# Patient Record
Sex: Female | Born: 1970 | Race: Black or African American | Hispanic: No | Marital: Single | Smoking: Current some day smoker
Health system: Southern US, Community
[De-identification: ages and names within clinical notes are randomized; demographics above are authoritative.]

## PROBLEM LIST (undated history)

## (undated) ENCOUNTER — Inpatient Hospital Stay (HOSPITAL_COMMUNITY): Payer: Self-pay

## (undated) DIAGNOSIS — Z3490 Encounter for supervision of normal pregnancy, unspecified, unspecified trimester: Secondary | ICD-10-CM

## (undated) DIAGNOSIS — F99 Mental disorder, not otherwise specified: Secondary | ICD-10-CM

## (undated) DIAGNOSIS — I10 Essential (primary) hypertension: Secondary | ICD-10-CM

---

## 2007-10-09 ENCOUNTER — Ambulatory Visit: Payer: Self-pay | Admitting: Internal Medicine

## 2007-10-09 ENCOUNTER — Encounter (INDEPENDENT_AMBULATORY_CARE_PROVIDER_SITE_OTHER): Payer: Self-pay | Admitting: Nurse Practitioner

## 2007-10-09 LAB — CONVERTED CEMR LAB
ALT: 10 units/L (ref 0–35)
AST: 14 units/L (ref 0–37)
Alkaline Phosphatase: 75 units/L (ref 39–117)
Basophils Absolute: 0.1 10*3/uL (ref 0.0–0.1)
Basophils Relative: 1 % (ref 0–1)
Calcium: 9.8 mg/dL (ref 8.4–10.5)
Chloride: 107 meq/L (ref 96–112)
Creatinine, Ser: 1.04 mg/dL (ref 0.40–1.20)
Eosinophils Absolute: 0.2 10*3/uL (ref 0.0–0.7)
Hemoglobin: 13.4 g/dL (ref 12.0–15.0)
MCHC: 32.7 g/dL (ref 30.0–36.0)
MCV: 89.1 fL (ref 78.0–100.0)
Monocytes Absolute: 0.5 10*3/uL (ref 0.1–1.0)
Neutro Abs: 3.4 10*3/uL (ref 1.7–7.7)
Neutrophils Relative %: 53 % (ref 43–77)
RDW: 14.8 % (ref 11.5–15.5)

## 2007-10-16 ENCOUNTER — Ambulatory Visit (HOSPITAL_COMMUNITY): Admission: RE | Admit: 2007-10-16 | Discharge: 2007-10-16 | Payer: Self-pay | Admitting: Family Medicine

## 2008-09-09 ENCOUNTER — Emergency Department (HOSPITAL_COMMUNITY): Admission: EM | Admit: 2008-09-09 | Discharge: 2008-09-09 | Payer: Self-pay | Admitting: Emergency Medicine

## 2008-09-24 ENCOUNTER — Ambulatory Visit: Payer: Self-pay | Admitting: Internal Medicine

## 2008-10-01 ENCOUNTER — Inpatient Hospital Stay (HOSPITAL_COMMUNITY): Admission: AD | Admit: 2008-10-01 | Discharge: 2008-10-01 | Payer: Self-pay | Admitting: Obstetrics & Gynecology

## 2010-08-04 LAB — GLUCOSE, CAPILLARY: Glucose-Capillary: 93 mg/dL (ref 70–99)

## 2011-10-08 ENCOUNTER — Ambulatory Visit (HOSPITAL_COMMUNITY)
Admission: RE | Admit: 2011-10-08 | Discharge: 2011-10-08 | Disposition: A | Discharge status: Home / Self Care | Payer: Medicaid Other | Attending: Psychiatry | Admitting: Psychiatry

## 2011-10-08 ENCOUNTER — Emergency Department (HOSPITAL_COMMUNITY)
Admission: EM | Admit: 2011-10-08 | Discharge: 2011-10-09 | Disposition: A | Discharge status: Other Acute Inpatient Hospital | Payer: Self-pay | Attending: Emergency Medicine | Admitting: Emergency Medicine

## 2011-10-08 ENCOUNTER — Encounter (HOSPITAL_COMMUNITY): Payer: Self-pay | Admitting: Emergency Medicine

## 2011-10-08 DIAGNOSIS — F29 Unspecified psychosis not due to a substance or known physiological condition: Secondary | ICD-10-CM | POA: Insufficient documentation

## 2011-10-08 DIAGNOSIS — Z9119 Patient's noncompliance with other medical treatment and regimen: Secondary | ICD-10-CM | POA: Insufficient documentation

## 2011-10-08 DIAGNOSIS — Z91199 Patient's noncompliance with other medical treatment and regimen due to unspecified reason: Secondary | ICD-10-CM | POA: Insufficient documentation

## 2011-10-08 DIAGNOSIS — Z598 Other problems related to housing and economic circumstances: Secondary | ICD-10-CM | POA: Diagnosis not present

## 2011-10-08 DIAGNOSIS — Z5987 Material hardship due to limited financial resources, not elsewhere classified: Secondary | ICD-10-CM | POA: Insufficient documentation

## 2011-10-08 NOTE — ED Provider Notes (Addendum)
History     CSN: [id number]  Arrival date & time 10/08/11  2016   First MD Initiated Contact with Patient 10/08/11 2255      Chief Complaint  Patient presents with  . Delusional    (Consider location/radiation/quality/duration/timing/severity/associated sxs/prior treatment) HPI History provided by patient. Sent here from behavioral health for medical clearance. Patient is a limited historian. She does have psychiatric history but cannot coming what her diagnosis is. About 2 years ago she was prescribed Haldol, Seroquel, Cymbalta, and Respirdal.  She states she has not taken those medications in that timeframe. She was previously admitted to the psychiatric facility in [facility] [place].  She had been staying in a women's shelter and states she left tonight.  She went to behavioral health and was referred here for medical clearance. Patient's main complaint is that she has some sort of device interbody. It was previously in her uterus and now feels like its in her side. She denies any pain. She denies any vaginal bleeding or discharge. She states history of C-section x1 about 9 years ago and believes something they have been placed at that time. She denies hearing voices or seeing things that are not there. She denies any suicidal or homicidal ideation. He denies any fevers or chills. No trauma. No abdominal pain, back pain or pelvic pain.  History reviewed. No pertinent past medical history.  History reviewed. No pertinent past surgical history.  No family history on file.  History  Substance Use Topics  . Smoking status: Never Smoker   . Smokeless tobacco: Not on file  . Alcohol Use: No    OB History    Grav Para Term Preterm Abortions TAB SAB Ect Mult Living                  Review of Systems  Constitutional: Negative for fever and chills.  HENT: Negative for neck pain and neck stiffness.   Eyes: Negative for pain.  Respiratory: Negative for shortness of breath.    Cardiovascular: Negative for chest pain.  Gastrointestinal: Negative for abdominal pain.  Genitourinary: Negative for dysuria.  Musculoskeletal: Negative for back pain.  Skin: Negative for rash.  Neurological: Negative for headaches.  Psychiatric/Behavioral: Positive for hallucinations. Negative for suicidal ideas, self-injury and agitation.  All other systems reviewed and are negative.    Allergies  Review of patient's allergies indicates no known allergies.  Home Medications  No current outpatient prescriptions on file.  BP 162/108  Pulse 62  Temp 99.2 F (37.3 C) (Oral)  Resp 20  SpO2 100%  Physical Exam  Constitutional: She is oriented to person, place, and time. She appears well-developed and well-nourished.  HENT:  Head: Normocephalic and atraumatic.  Eyes: Conjunctivae and EOM are normal. Pupils are equal, round, and reactive to light.  Neck: Trachea normal. Neck supple. No thyromegaly present.  Cardiovascular: Normal rate, regular rhythm, S1 normal, S2 normal and normal pulses.     No systolic murmur is present   No diastolic murmur is present  Pulses:      Radial pulses are 2+ on the right side, and 2+ on the left side.  Pulmonary/Chest: Effort normal and breath sounds normal. She has no wheezes. She has no rhonchi. She has no rales. She exhibits no tenderness.  Abdominal: Soft. Normal appearance and bowel sounds are normal. There is no tenderness. There is no rebound, no guarding, no CVA tenderness and negative Murphy's sign.       No ecchymosis, rash  or lesions. No palpable tenderness or mass.  Musculoskeletal:       BLE:s Calves nontender, no cords or erythema, negative Homans sign  Neurological: She is alert and oriented to person, place, and time. She has normal strength. No cranial nerve deficit or sensory deficit. GCS eye subscore is 4. GCS verbal subscore is 5. GCS motor subscore is 6.  Skin: Skin is warm and dry. No rash noted. She is not diaphoretic.    Psychiatric: Her speech is normal.       Cooperative and appropriate    ED Course  Procedures (including critical care time) Results for orders placed during the hospital encounter of 10/08/11  URINE RAPID DRUG SCREEN (HOSP PERFORMED)      Component Value Range   Opiates NONE DETECTED  NONE DETECTED   Cocaine NONE DETECTED  NONE DETECTED   Benzodiazepines NONE DETECTED  NONE DETECTED   Amphetamines NONE DETECTED  NONE DETECTED   Tetrahydrocannabinol NONE DETECTED  NONE DETECTED   Barbiturates NONE DETECTED  NONE DETECTED  URINALYSIS, ROUTINE W REFLEX MICROSCOPIC      Component Value Range   Color, Urine AMBER (*) YELLOW   APPearance CLOUDY (*) CLEAR   Specific Gravity, Urine 1.034 (*) 1.005 - 1.030   pH 6.0  5.0 - 8.0   Glucose, UA NEGATIVE  NEGATIVE mg/dL   Hgb urine dipstick LARGE (*) NEGATIVE   Bilirubin Urine MODERATE (*) NEGATIVE   Ketones, ur >80 (*) NEGATIVE mg/dL   Protein, ur 30 (*) NEGATIVE mg/dL   Urobilinogen, UA 1.0  0.0 - 1.0 mg/dL   Nitrite NEGATIVE  NEGATIVE   Leukocytes, UA NEGATIVE  NEGATIVE  PREGNANCY, URINE      Component Value Range   Preg Test, Ur NEGATIVE  NEGATIVE  URINE MICROSCOPIC-ADD ON      Component Value Range   Squamous Epithelial / LPF FEW (*) RARE   WBC, UA 0-2  <3 WBC/hpf   RBC / HPF 0-2  <3 RBC/hpf   Bacteria, UA RARE  RARE   Crystals CA OXALATE CRYSTALS (*) NEGATIVE   Urine-Other MUCOUS PRESENT    CBC      Component Value Range   WBC 4.0  4.0 - 10.5 K/uL   RBC 4.21  3.87 - 5.11 MIL/uL   Hemoglobin 12.6  12.0 - 15.0 g/dL   HCT 40.9 (*) 81.1 - 91.4 %   MCV 85.0  78.0 - 100.0 fL   MCH 29.9  26.0 - 34.0 pg   MCHC 35.2  30.0 - 36.0 g/dL   RDW 78.2  95.6 - 21.3 %   Platelets 263  150 - 400 K/uL  DIFFERENTIAL      Component Value Range   Neutrophils Relative 38 (*) 43 - 77 %   Neutro Abs 1.5 (*) 1.7 - 7.7 K/uL   Lymphocytes Relative 51 (*) 12 - 46 %   Lymphs Abs 2.0  0.7 - 4.0 K/uL   Monocytes Relative 10  3 - 12 %   Monocytes  Absolute 0.4  0.1 - 1.0 K/uL   Eosinophils Relative 1  0 - 5 %   Eosinophils Absolute 0.1  0.0 - 0.7 K/uL   Basophils Relative 1  0 - 1 %   Basophils Absolute 0.0  0.0 - 0.1 K/uL  ETHANOL      Component Value Range   Alcohol, Ethyl (B) <11  0 - 11 mg/dL  BASIC METABOLIC PANEL      Component Value Range  Sodium 138  135 - 145 mEq/L   Potassium 3.1 (*) 3.5 - 5.1 mEq/L   Chloride 100  96 - 112 mEq/L   CO2 21  19 - 32 mEq/L   Glucose, Bld 79  70 - 99 mg/dL   BUN 10  6 - 23 mg/dL   Creatinine, Ser 1.61  0.50 - 1.10 mg/dL   Calcium 9.7  8.4 - 09.6 mg/dL   GFR calc non Af Amer >90  >90 mL/min   GFR calc Af Amer >90  >90 mL/min   PO fluids, PO potassium.  Acute psychosis. Unknown psychiatric history. Off of psych meds. ACT team consult for psychiatric disposition. No suicidal or homicidal ideation.  MDM   Nursing notes reviewed. Vital signs reviewed. Labs and UA obtained/ reviewed as above. No old psychiatric records available.   1:57 AM ACT aware, labs pending, [facility] aware. Psych dispo pending.    Telepsych consult for med recs. Holding orders initiated.   Sunnie Nielsen, MD 10/09/11 0356  4:08 AM case d/w Dr Jacky Kindle in telepsych consult - he recommends Psych admit. Start seroquel 25 Q AM and 100mg  QHS.   Sunnie Nielsen, MD 10/09/11 0408   Date: 10/09/2011  Rate: 62  Rhythm: normal sinus rhythm  QRS Axis: normal  Intervals: normal  ST/T Wave abnormalities: nonspecific ST changes  Conduction Disutrbances:none  Narrative Interpretation: QTc 426  Old EKG Reviewed: none available  PT accepted at [facility] pending available bed.   Sunnie Nielsen, MD 10/09/11 [phone]
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
## 2011-10-08 NOTE — ED Notes (Signed)
Pt sent from behavioral health for medical clearance. Pt states she believes someone is poisoning her food and there's a "probe" moving around inside her. She states she doesn't know where it came from, but she just knows it's there. Denies s/i h/i. States she moved out of women's shelter today. Pt refuses to answer questions pertaining to family situation, pt also refuses to remove headscarf and socks. Charge nurse aware. Pt also refusing lab work to be drawn. States "you're not going to put anything in my veins. i already told them that. MD and charge nurse aware.

## 2011-10-09 ENCOUNTER — Inpatient Hospital Stay (HOSPITAL_COMMUNITY)
Admission: AD | Admit: 2011-10-09 | Discharge: 2011-10-12 | DRG: 885 | Disposition: A | Discharge status: Home / Self Care | Payer: Federal, State, Local not specified - Other | Source: Other Acute Inpatient Hospital | Attending: Psychiatry | Admitting: Psychiatry

## 2011-10-09 ENCOUNTER — Encounter (HOSPITAL_COMMUNITY): Payer: Self-pay | Admitting: *Deleted

## 2011-10-09 DIAGNOSIS — F200 Paranoid schizophrenia: Principal | ICD-10-CM | POA: Diagnosis present

## 2011-10-09 HISTORY — DX: Mental disorder, not otherwise specified: F99

## 2011-10-09 LAB — DIFFERENTIAL
Basophils Absolute: 0 10*3/uL (ref 0.0–0.1)
Basophils Relative: 1 % (ref 0–1)
Eosinophils Relative: 1 % (ref 0–5)
Monocytes Absolute: 0.4 10*3/uL (ref 0.1–1.0)

## 2011-10-09 LAB — URINALYSIS, ROUTINE W REFLEX MICROSCOPIC
Glucose, UA: NEGATIVE mg/dL
Ketones, ur: >80 mg/dL — AB
Leukocytes, UA: NEGATIVE
Protein, ur: 30 mg/dL — AB
pH: 6 (ref 5.0–8.0)

## 2011-10-09 LAB — CBC
HCT: 35.8 % — ABNORMAL LOW (ref 36.0–46.0)
MCHC: 35.2 g/dL (ref 30.0–36.0)
MCV: 85 fL (ref 78.0–100.0)
Platelets: 263 10*3/uL (ref 150–400)
RDW: 13.1 % (ref 11.5–15.5)
WBC: 4 10*3/uL (ref 4.0–10.5)

## 2011-10-09 LAB — BASIC METABOLIC PANEL
BUN: 10 mg/dL (ref 6–23)
Calcium: 9.7 mg/dL (ref 8.4–10.5)
Creatinine, Ser: 0.69 mg/dL (ref 0.50–1.10)
GFR calc Af Amer: >90 mL/min (ref >90)
GFR calc non Af Amer: >90 mL/min (ref >90)

## 2011-10-09 LAB — RAPID URINE DRUG SCREEN, HOSP PERFORMED
Amphetamines: NOT DETECTED
Benzodiazepines: NOT DETECTED
Opiates: NOT DETECTED

## 2011-10-09 LAB — PREGNANCY, URINE: Preg Test, Ur: NEGATIVE

## 2011-10-09 LAB — URINE MICROSCOPIC-ADD ON

## 2011-10-09 LAB — ETHANOL: Alcohol, Ethyl (B): <11 mg/dL (ref 0–11)

## 2011-10-09 MED ORDER — IBUPROFEN 200 MG PO TABS: 600.0000 mg | ORAL_TABLET | Freq: Three times a day (TID) | ORAL | Status: DC | PRN

## 2011-10-09 MED ORDER — LORAZEPAM 1 MG PO TABS: 1.0000 mg | ORAL_TABLET | Freq: Three times a day (TID) | ORAL | Status: DC | PRN

## 2011-10-09 MED ORDER — PALIPERIDONE ER 6 MG PO TB24
6.0000 mg | ORAL_TABLET | Freq: Every day | ORAL | Status: DC
  Administered 2011-10-09: 6 mg via ORAL
  Filled 2011-10-09 (×6): qty 1

## 2011-10-09 MED ORDER — LORAZEPAM 2 MG/ML IJ SOLN: 1.0000 mg | INTRAMUSCULAR | Status: DC | PRN

## 2011-10-09 MED ORDER — MAGNESIUM HYDROXIDE 400 MG/5ML PO SUSP: 30.0000 mL | Freq: Every day | ORAL | Status: DC | PRN

## 2011-10-09 MED ORDER — LORAZEPAM 2 MG/ML IJ SOLN
2.0000 mg | Freq: Once | INTRAMUSCULAR | Status: AC
  Administered 2011-10-09: 2 mg via INTRAMUSCULAR

## 2011-10-09 MED ORDER — QUETIAPINE FUMARATE 100 MG PO TABS
100.0000 mg | ORAL_TABLET | Freq: Every day | ORAL | Status: DC
  Filled 2011-10-09: qty 1

## 2011-10-09 MED ORDER — ONDANSETRON HCL 8 MG PO TABS: 4.0000 mg | ORAL_TABLET | Freq: Three times a day (TID) | ORAL | Status: DC | PRN

## 2011-10-09 MED ORDER — ACETAMINOPHEN 325 MG PO TABS: 650.0000 mg | ORAL_TABLET | Freq: Four times a day (QID) | ORAL | Status: DC | PRN

## 2011-10-09 MED ORDER — QUETIAPINE FUMARATE 25 MG PO TABS
25.0000 mg | ORAL_TABLET | Freq: Every morning | ORAL | Status: DC
  Administered 2011-10-09: 25 mg via ORAL
  Filled 2011-10-09: qty 1

## 2011-10-09 MED ORDER — ALUM & MAG HYDROXIDE-SIMETH 200-200-20 MG/5ML PO SUSP: 30.0000 mL | ORAL | Status: DC | PRN

## 2011-10-09 MED ORDER — BENZTROPINE MESYLATE 0.5 MG PO TABS
0.5000 mg | ORAL_TABLET | Freq: Two times a day (BID) | ORAL | Status: DC
  Administered 2011-10-09: 0.5 mg via ORAL
  Filled 2011-10-09 (×8): qty 1

## 2011-10-09 MED ORDER — LORAZEPAM 1 MG PO TABS: 1.0000 mg | ORAL_TABLET | ORAL | Status: DC | PRN

## 2011-10-09 MED ORDER — ACETAMINOPHEN 325 MG PO TABS
650.0000 mg | ORAL_TABLET | Freq: Four times a day (QID) | ORAL | Status: DC | PRN
Start: 2011-10-09 — End: 2011-10-12

## 2011-10-09 MED ORDER — LORAZEPAM 2 MG/ML IJ SOLN
INTRAMUSCULAR | Status: AC
  Filled 2011-10-09: qty 1

## 2011-10-09 MED ORDER — HALOPERIDOL LACTATE 5 MG/ML IJ SOLN
5.0000 mg | Freq: Once | INTRAMUSCULAR | Status: AC
  Administered 2011-10-09: 5 mg via INTRAMUSCULAR

## 2011-10-09 MED ORDER — POTASSIUM CHLORIDE CRYS ER 20 MEQ PO TBCR
40.0000 meq | EXTENDED_RELEASE_TABLET | Freq: Once | ORAL | Status: AC
  Administered 2011-10-09: 40 meq via ORAL
  Filled 2011-10-09: qty 2

## 2011-10-09 MED ORDER — ACETAMINOPHEN 325 MG PO TABS: 650.0000 mg | ORAL_TABLET | ORAL | Status: DC | PRN

## 2011-10-09 MED ORDER — TRAZODONE HCL 100 MG PO TABS
100.0000 mg | ORAL_TABLET | Freq: Every day | ORAL | Status: DC
  Administered 2011-10-09: 100 mg via ORAL
  Filled 2011-10-09 (×4): qty 1
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
## 2011-10-09 NOTE — ED Notes (Signed)
[facility] called for transport 

## 2011-10-09 NOTE — Tx Team (Signed)
Initial Interdisciplinary Treatment Plan  PATIENT STRENGTHS: (choose at least two) Supportive family/friends  PATIENT STRESSORS: psychosis   PROBLEM LIST: Problem List/Patient Goals Date to be addressed Date deferred Reason deferred Estimated date of resolution  Psychotic with auditory hallucinations;  10-09-11           Delusional  10-09-11                                          DISCHARGE CRITERIA:  Improved stabilization in mood, thinking, and/or behavior Reduction of life-threatening or endangering symptoms to within safe limits Verbal commitment to aftercare and medication compliance  PRELIMINARY DISCHARGE PLAN: Attend aftercare/continuing care group  PATIENT/FAMIILY INVOLVEMENT: This treatment plan has been presented to and reviewed with the patient, Holly Hines, and/or family member, .  The patient and family have been given the opportunity to ask questions and make suggestions.  [name] 10/09/2011, 1:37 PM

## 2011-10-09 NOTE — BHH Counselor (Signed)
CLINICIAN SPOKE WITH YOLANDA (ASSESSMENT RN) WHOM RELAYED THAT PT HAS BEEN ACCEPTED BY DR. Ferol Luz TO DR. Allena Katz. ROOM 400-1. CLINICIAN HAS DONE ALL SUPPORT PAPER WORK & INFORMED PT'S NURSE & DR. GHIM WHO HAS START THE PROCESS FOR TRANSFER TO [facility].

## 2011-10-09 NOTE — ED Notes (Signed)
Pt broke computer on wall. Security posted at bedside. Pt sitting quietly. Continues to refuse further treatment at this time

## 2011-10-09 NOTE — ED Provider Notes (Signed)
Pt accepted to [facility] by Dr. Allena Katz.  Pt was previously sent here for delusions and needing medical clearance.    Holly Hines. Oletta Lamas, MD 10/09/11 2130

## 2011-10-09 NOTE — Progress Notes (Signed)
Patient ID: Holly Hines, female   DOB: 03-25-1971, 41 y.o.   MRN: [id number]  [facility] Group Notes:  (Counselor/Nursing/MHT/Case Management/Adjunct)  10/09/2011 11 AM  Type of Therapy:  Aftercare Planning, Group Therapy, Dance/Movement Therapy   Participation Level:  Did Not Attend   Rhunette Croft

## 2011-10-09 NOTE — ED Notes (Signed)
Spoke with yolanda at [facility]. Per yolanda, pt accepted at facility but no bed assignment at this time.

## 2011-10-09 NOTE — ED Notes (Signed)
Pt sleeping. Respirations even and unlabored,.

## 2011-10-09 NOTE — Progress Notes (Signed)
Patient ID: Holly Hines, female   DOB: Apr 19, 1971, 41 y.o.   MRN: [id number] 06-15/13 nursing adm note: pt came to [facility] involuntarily with an admitting diagnosis of psychotic d/o nos. She denied any si/hi and a/v but she seems preoccupied, perhaps internal stimuli and was unable to answer some of the admissions questions. She is very delusional. It was reported that she stated she is at [facility] because "equipment has been put inside her uterus". She stated that unidentified people have "cracked my ovaries" and "put transmitters in my bones". She believes people are putting rat poison in her food and that people are telling her "who I should have sex with". She is probably having auditory hallucinations, but this was unable to be confirmed with the patient. She is a poor historian. She stated she is not taking any home meds, denied any pain and any allergies. She has a hx of oupatient mental health tx at [facility]. She stated she has not taken any medications in a long time. She stated she has seasonal allergies and has had a c sec in the past x1. Her thought process has a paranoid content. She acknowledges sadness, social withdrawal, loc of interest in usual pleasures and some irritability. Her labs are cbc wnl except hct 35.8; diff neutrofils 38 low, neutro abs 1.5, lymphocytes relative 51 high; etoh negative; bmet wnl except potsssium at 3.1 low; uds negative; ua cloudy, amber, specific gravity 1.034; preg negative:  She was escorted to the 400 hall and was following instruction. She had been cirted prior to coming to [facility].

## 2011-10-09 NOTE — ED Notes (Signed)
MD at bedside to discuss plan of care. Pt states she does not want lab work. Pt became angry and attempted to break computer off of wall. MD aware. Involuntary commmitment paperwork to be filled out at this time. Charge nurse aware.

## 2011-10-10 DIAGNOSIS — F200 Paranoid schizophrenia: Secondary | ICD-10-CM | POA: Diagnosis present

## 2011-10-10 MED ORDER — TRAZODONE HCL 50 MG PO TABS
50.0000 mg | ORAL_TABLET | Freq: Every day | ORAL | Status: DC
  Filled 2011-10-10 (×4): qty 1

## 2011-10-10 MED ORDER — ENSURE COMPLETE PO LIQD
237.0000 mL | Freq: Two times a day (BID) | ORAL | Status: DC
  Administered 2011-10-10: 237 mL via ORAL

## 2011-10-10 MED ORDER — RISPERIDONE 1 MG PO TBDP
1.0000 mg | ORAL_TABLET | Freq: Two times a day (BID) | ORAL | Status: DC
  Administered 2011-10-10: 1 mg via ORAL
  Filled 2011-10-10 (×5): qty 1

## 2011-10-10 NOTE — Progress Notes (Signed)
Patient ID: Holly Hines, female   DOB: 1970/05/25, 41 y.o.   MRN: [id number]  [facility] Group Notes:  (Counselor/Nursing/MHT/Case Management/Adjunct)  10/10/2011 11 AM  Type of Therapy:  Aftercare Planning, Group Therapy, Dance/Movement Therapy   Participation Level:  Did Not Attend   Rhunette Croft

## 2011-10-10 NOTE — Progress Notes (Signed)
D: Pt has been in room much of the day, pt has been unable to participate in various milieu activities today, pt presents today as paranoid and guarded, pt refused medications in the morning, much of pt statements were paranoid in nature, pt refused to sign consent for release of information to [facility]. A: Pt provided with rationale for attempting to obtain consents, pt also provided with support and encouragement during the day. R: Pt did agree to take some risperdal but still did not consent for release of information

## 2011-10-10 NOTE — [facility] Suicide Risk Assessment (Signed)
Suicide Risk Assessment  Admission Assessment     Demographic factors:  Assessment Details Time of Assessment: Admission Information Obtained From: Patient Current Mental Status:  Current Mental Status:  (denies any si/hi and a/v), disorganized, mood is ok, affect blunted, poor memory Loss Factors:  Loss Factors:  (unable to assess) Historical Factors: denies any SI attempts in past. d Risk Reduction Factors:  Risk Reduction Factors: Responsible for children under 53 years of age (unable to fully assess)  CLINICAL FACTORS:   Currently Psychotic  COGNITIVE FEATURES THAT CONTRIBUTE TO RISK:  Loss of executive function    SUICIDE RISK:   Mild:  Suicidal ideation of limited frequency, intensity, duration, and specificity.  There are no identifiable plans, no associated intent, mild dysphoria and related symptoms, good self-control (both objective and subjective assessment), few other risk factors, and identifiable protective factors, including available and accessible social support.  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  Axis IV: economic problems, housing problems, occupational problems, problems related to social environment, problems with access to health care services and problems with primary support group  Axis V: GAF=25    Plan;  Continue current med for psychosis Expand hx  Holly Hines 10/10/2011, 9:58 AM

## 2011-10-10 NOTE — Progress Notes (Signed)
Pt refused to complete the psychosocial assessment with therapist 2 times this morning.

## 2011-10-10 NOTE — Progress Notes (Addendum)
This pt experienced a fall around 2346 on 10/09/11. Writer and staff heard a loud thump on the wall. Writer and staff responded to find pt on the floor sitting against the wall. Pt had a sanitary napkin in her hand with her scrub pants below her knees. Pt stated that she felt weak and lightheaded before her fall. Pt reported falling on her bottom. Pt also said that she felt like her trazodone was too strong. Pt cbg was obtained and resulted at 105. No immediate injury was noted for this pt. Pt sitting vitals did not register with the Dinam[employer]. Writer went to obtain a manual BP cuff. Pt was placed in the bed by ambulating with nearby staff for assistance. A cup of ice water was given upon pt request. Manual lying BP was obtained at 100/54. Writer and accompanying nurse attempted to put up pt's side rails at the [facility]. Pt consistently kept refusing for her side rails to be placed up. Pt side rails left down. Pt instructed to notify tech/staff during q58min checks when she had the urge to go to the bathroom.  MD called with assessment findings verbalized. MD instructed for writer to continue to monitor pt and to call back if needed. Pt currently is wearing red socks. A yellow armband and a fall risk sign has been applied to her door. Staff aware of pts fall risk status. Pt remains safe at this time.

## 2011-10-10 NOTE — Progress Notes (Signed)
Patient ID: Holly Hines, female   DOB: 05/09/1970, 41 y.o.   MRN: [id number]  [facility] MD Progress Note                                         10/10/2011    Larken Urias 10/05/1970    [id number] Hospital day #1  Dx: schizophrenic, paranoid type The patient was seen today and reports the following:  Sleep: ok patient appears less sedated today. Appetite: decreased  Mild>(1-10) >Severe  Hopelessness (1-10): 0 Depression (1-10): 9/10 Anxiety (1-10): 0 Suicidal Ideation: .The patient denies suicidal ideations. Plan: None Intent: None Means:  None Homicidal Ideation: The patient denies homicidal ideations. Plan: None Intent: None Means: None  Eye Contact: fair, improved over yesterday. General Appearance/Behavior: lethargic Motor Behavior: +psychomotor retardation. Speech: clear  Mental Status: oriented x 2 Level of Consciousness:  Less sedated Mood: depressed Affect:flat  Thought Process: delusional Thought Content: delusions of foreign bodies in her body Perception: impaired  Judgment: impaired Insight: lacking Cognition: poor  Sleep: Number of Hours:   Filed Vitals:   10/10/11 0808  BP: 120/74  Pulse: 91  Temp: 98.7 F (37.1 C)  Resp: 18       . benztropine  0.5 mg Oral BID  . paliperidone  6 mg Oral Daily  . traZODone  100 mg Oral QHS    Results for orders placed during the hospital encounter of 10/09/11 (from the past 48 hour(s))  GLUCOSE, CAPILLARY     Status: Abnormal   Collection Time   10/09/11 11:50 PM      Component Value Range Comment   Glucose-Capillary 105 (*) 70 - 99 mg/dL     No results found for this or any previous visit (from the past 48 hour(s)).  ROS:    Constitutional: WDWN AAF NAD   GI: Negative for N,V,D,C   Neuro: Negative for dizziness, blurred vision, visual changes, headaches   Resp: Negative for wheezing, SOB, cough   Cardio: Negative for CP, diaphoresis, fatigue   MSK: Negative for joint pain, swelling, DROM,  or ambulatory difficulties.   Time was spent talking with the patient and discussing her symptoms.  She was reported to have suffered an unwhitnessed fall last night.  Today she reports no pain or related injuries. States she does not need to go to the ED. She did have a small scrape to her right posterior forearm. She does remember that she was treated by a Dr. Rennis Harding at [facility] in 2006. While she is a little more lucid today than yesterday she is still disorganized and delusional in her thinking and her speech.  She states she has been on Risperdal and Haldol in the past and would like to try Risperdal again so she is not so sedated as she has been over the last 24 hours.    She has refused the Invega this morning. Treatment plan: 1. Stop Invega. 2. Start Risperdal 1mg  PO BID. 3. Ensure to increase nutritional status. 4. Get ROI forms for [facility] and [facility] 5. Continue to monitor.  Treatment Summary:  1. Daily contact with patient to assess and evaluate symptoms and progress in treatment.  2. Medication management  3. The patient will deny suicidal ideations or homicidal ideations for 48 hours prior to discharge and have a depression and anxiety rating of 3 or less. The patient will also deny  any auditory or visual hallucinations or delusional thinking.  4. The patient will deny any symptoms of substance withdrawal at time of discharge.    Rona Ravens. Talisa Petrak [facility] 10/10/2011

## 2011-10-10 NOTE — BHH Counselor (Signed)
Adult Comprehensive Assessment  Patient ID: Israel Wunder, female   DOB: October 12, 1970, 41 y.o.   MRN: [id number]  Information Source: Information source: Patient  Current Stressors:  Educational / Learning stressors: pt would not answer Employment / Job issues: pt would not answer Family Relationships: pt would not answer Financial / Lack of resources (include bankruptcy): pt would not answer Housing / Lack of housing: pt would not answer Physical health (include injuries & life threatening diseases): pt would not answer Social relationships: pt would not answer Substance abuse: pt would not answer Bereavement / Loss: pt would not answer  Living/Environment/Situation:  Living Arrangements: Other (Comment) (in and out of womens shelters) Living conditions (as described by patient or guardian): "ok" How long has patient lived in current situation?: 6 months What is atmosphere in current home: Chaotic;Temporary  Family History:  Marital status: Single Does patient have children?:  (pt would not answer, she did speak of a pregancy)  Childhood History:  By whom was/is the patient raised?: Both parents Additional childhood history information: both have past Description of patient's relationship with caregiver when they were a child: "ok", dad left after they divorced had no contact with him after that Patient's description of current relationship with people who raised him/her: NA Does patient have siblings?: Yes Number of Siblings: 1  (1 brother) Description of patient's current relationship with siblings: "some" contact Did patient suffer any verbal/emotional/physical/sexual abuse as a child?:  (pt would not answer) Did patient suffer from severe childhood neglect?:  (pt would not answer) Witnessed domestic violence?:  (pt would not answer) Has patient been effected by domestic violence as an adult?:  (pt would not answer)  Education:  Highest grade of school patient has  completed: 9th grade and GED Currently a student?: No Learning disability?: No  Employment/Work Situation:   Employment situation: Employed Where is patient currently employed?: works at hotels and [employer] long has patient been employed?: "my entire life" Patient's job has been impacted by current illness: Yes Describe how patient's job has been implacted: pt reports she can't work due to problems What is the longest time patient has a held a job?: pt would not answer Where was the patient employed at that time?: pt would not answer Has patient ever been in the [employer]?: No Has patient ever served in combat?: No  Financial Resources:   Financial resources: Income from employment Does patient have a representative payee or guardian?:  (pt would not answer)  Alcohol/Substance Abuse:   What has been your use of drugs/alcohol within the last 12 months?: pt would not answer If attempted suicide, did drugs/alcohol play a role in this?:  (pt would not answer) If yes, describe treatment: NA Has alcohol/substance abuse ever caused legal problems?:  (pt would not answer)  Social Support System:   Describe Community Support System: pt would not answer Type of faith/religion: pt would not answer How does patient's faith help to cope with current illness?: pt would not answer  Leisure/Recreation:   Leisure and Hobbies: pt would not answer  Strengths/Needs:   What things does the patient do well?: pt would not answer In what areas does patient struggle / problems for patient: pt would not answer  Discharge Plan:   Does patient have access to transportation?:  (pt would not answer) Will patient be returning to same living situation after discharge?:  (pt would not answer) Currently receiving community mental health services:  (pt would not answer) If no, would patient like  referral for services when discharged?:  (pt would not answer) Does patient have financial barriers related to  discharge medications?:  (pt would not answer)  Summary/Recommendations:   Summary and Recommendations (to be completed by the evaluator): Pt was very resistant to talking to therapist. She answered some questions then asked if therapist was associated with the "join or federal commission" when therapist replied no, pt starred at her and stated "I don't want to talk anymore" pulled her blanket over her head and refused to speak further. Recommendations include crisis stabilization, case management, medication management, psycho-education groups to teach coping skills and group therapy.  Lenis Nettleton. 10/10/2011

## 2011-10-10 NOTE — H&P (Signed)
  Pt was seen by me today. Will continue current meds.  The detailed H&P note will be done by mid level (NP/PA).  

## 2011-10-10 NOTE — Progress Notes (Signed)
[facility] Family/Significant Other Suicide Prevention Education  Suicide Prevention Education:  Patient Refusal for Family/Significant Other Suicide Prevention Education: The patient Holly Hines has refused to provide written consent for family/significant other to be provided Family/Significant Other Suicide Prevention Education during admission and/or prior to discharge.  Physician notified.  [facility] 10/10/2011, 4:29 PM

## 2011-10-11 MED ORDER — LISINOPRIL 10 MG PO TABS
10.0000 mg | ORAL_TABLET | Freq: Every day | ORAL | Status: DC
  Filled 2011-10-11 (×4): qty 1

## 2011-10-11 MED ORDER — RISPERIDONE 1 MG PO TBDP
3.0000 mg | ORAL_TABLET | Freq: Every day | ORAL | Status: DC
  Filled 2011-10-11 (×2): qty 3

## 2011-10-11 MED ORDER — LISINOPRIL 10 MG PO TABS
10.0000 mg | ORAL_TABLET | ORAL | Status: AC
  Administered 2011-10-11: 10 mg via ORAL
  Filled 2011-10-11: qty 1

## 2011-10-11 MED ORDER — POTASSIUM CHLORIDE CRYS ER 20 MEQ PO TBCR
20.0000 meq | EXTENDED_RELEASE_TABLET | ORAL | Status: DC
  Filled 2011-10-11 (×6): qty 1

## 2011-10-11 NOTE — Progress Notes (Signed)
Patient ID: Holly Hines, female   DOB: [birth year], 41 y.o.   MRN: [id number] A-Pt has been resting and sitting in her room today.  She did attend treatment team. Pt states "I want to go home today"  Says I don't want to be here.  "I came here for help but I don't think you can help me" Pt is calm.  Her affect is flat. She refused her medication this am.  She admitted that she thinks there is something in her body that shouldn't be there and says she plans to go to another MD about this .  Then said she did not want to talk about it anymore A-  Told pt she would not be discharged today and encouraged her to take ordered risperdal.   R- Pt refused again to take medication.  Says she doesn't think that medicine is good for her. Says she wants to go to [facility] for medication.  When asked if there is another medication pt has found helpful in the past she shook her head no.  She asked when she will see MD again.  She declined to fill out her pt inventory, again stating her desire to leave.

## 2011-10-11 NOTE — Discharge Planning (Signed)
Met with patient in Treatment Team only, as patient refusing to attend groups.  She has apparently staying in shelter(s) and cannot return to the most current one, for reasons unknown at this time.  She did not want to discuss with Case Manager in Treatment Team.  It is believed that she is a patient at [facility].    Patient is being placed under IVC today.  No known case management needs today.  Ambrose Mantle, LCSW 10/11/2011, 12:38 PM

## 2011-10-11 NOTE — Tx Team (Signed)
Interdisciplinary Treatment Plan Update (Adult)  Date:  10/11/2011  Time Reviewed:  10:15AM-11:15AM  Progress in Treatment: Attending groups:  No Participating in groups:   No Taking medication as prescribed:    No, refuses Tolerating medication: N/A   Family/Significant other contact made:  Patient refuses consent Patient understands diagnosis:   No Discussing patient identified problems/goals with staff:   No Medical problems stabilized or resolved:   N/A Denies suicidal/homicidal ideation:  Yes Issues/concerns per patient self-inventory:   None Other:    New problem(s) identified: Yes, Describe:  patient is refusing medications and demanding discharge; is being put under IVC  Reason for Continuation of Hospitalization: Delusions  Depression Hallucinations Medication stabilization  Interventions implemented related to continuation of hospitalization:  Medication monitoring and adjustment, safety checks Q15 min., suicide risk assessment, group therapy, psychoeducation, collateral contact, aftercare planning, ongoing physician assessments, medication education  Additional comments:  Not applicable  Estimated length of stay:  5-6 days  Discharge Plan:  Unknown at this time.  Patient came from a homeless shelter, and cannot return there.  Follow up is currently with [facility]  New goal(s):  Not applicable  Review of initial/current patient goals per problem list:   1.  Goal(s):  Medication stabilization  Met:  No  Target date:  By Discharge   As evidenced by:  Patient has been refusing medications  2.  Goal(s):  Decrease auditory hallucinations to baseline.  Met:  No  Target date:  By Discharge   As evidenced by:  Patient denies at this time, but since she has been refusing medications and is demanding discharge, this is questionable  3.  Goal(s):  Decrease paranoid delusions to baseline.  Met:  No  Target date:  By Discharge   As evidenced by:  No, see answer #2  above  4.  Goal(s):  Determine aftercare placement and follow-up.  Met:  No  Target date:  By Discharge   As evidenced by:  Unknown at this time  Attendees: Patient:  Holly Hines  10/11/2011 10:15AM-11:15AM  Family:     Physician:  Dr. Harvie Heck Readling 10/11/2011 10:15AM-11:15AM  Nursing:   Neill Loft, RN 10/11/2011 10:15AM -11:15AM   Case Manager:  Ambrose Mantle, LCSW 10/11/2011 10:15AM-11:15AM  Counselor:  Marni Griffon, LCAS 10/11/2011 10:15AM-11:15AM  Other:   Roswell Miners, RN 10/11/2011 10:15AM-11:15AM  Other:   Abbie Sons, RN 10/11/2011   Other:      Other:       Scribe for Treatment Team:   Sarina Ser, 10/11/2011, 10:15AM-11:15AM

## 2011-10-11 NOTE — Progress Notes (Signed)
Sleeping at long intervals.. No problem behaviors or disturbing thoughts reported or observed.  Q 15 minute safety checks are being conducted.

## 2011-10-11 NOTE — Progress Notes (Signed)
[facility] MD Progress Note  10/11/2011 12:28 PM  Diagnosis:  Axis I: Schizophrenia - Paranoid Type.   The patient was seen today and reports the following:   ADL's: Intact.  Sleep: The patient reports to sleeping well last night.  Appetite: The patient reports a good appetite today.   Mild>(1-10) >Severe  Hopelessness (1-10): 0  Depression (1-10): 0  Anxiety (1-10): 0   Suicidal Ideation: The patient denies any suicidal ideations today.  Plan: No  Intent: No  Means: No   Homicidal Ideation: The patient denies any homicidal ideations today.  Plan: No  Intent: No.  Means: No   General Appearance/Behavior: The patient was uncooperative today with this Physician and would not answer questions related to her paranoid delusions. Eye Contact: Good.  Speech: Appropriate in rate and volume with no pressuring noted today.  Motor Behavior: wnl.  Level of Consciousness: Alert and Oriented x 3.  Mental Status: Alert and Oriented x 3.  Mood: Appears moderately depressed today.  Affect: Appears moderately constricted.  Anxiety Level: No anxiety reported today.  Thought Process: Remains delusional.  Thought Content: The patient denies any auditory or visual hallucinations today. She is continuing to experiencing paranoid delusions today.  Perception: Remains delusional today.  Judgment: Poor.  Insight: Poor.  Cognition: Oriented to person, place and time.  Sleep:  Number of Hours: 6.75    Vital Signs:Blood pressure 149/109, pulse 94, temperature 98.4 F (36.9 C), temperature source Oral, resp. rate 17, height 5' 2.75" (1.594 m), weight 66.225 kg (146 lb), SpO2 98.00%.  Current Medications: Current Facility-Administered Medications  Medication Dose Route Frequency Provider Last Rate Last Dose  . acetaminophen (TYLENOL) tablet 650 mg  650 mg Oral Q6H PRN Mickeal Skinner, MD      . alum & mag hydroxide-simeth (MAALOX/MYLANTA) 200-200-20 MG/5ML suspension 30 mL  30 mL Oral Q4H PRN Mickeal Skinner, MD      . feeding supplement (ENSURE COMPLETE) liquid 237 mL  237 mL Oral BID BM Verne Spurr, PA-C   237 mL at 10/10/11 1638  . LORazepam (ATIVAN) tablet 1 mg  1 mg Oral PRN Mickeal Skinner, MD       Or  . LORazepam (ATIVAN) injection 1 mg  1 mg Intramuscular PRN Mickeal Skinner, MD      . magnesium hydroxide (MILK OF MAGNESIA) suspension 30 mL  30 mL Oral Daily PRN Mickeal Skinner, MD      . potassium chloride SA (K-DUR,KLOR-CON) CR tablet 20 mEq  20 mEq Oral BH-qamhs Apolonia Ellwood D Kelise Kuch, MD      . risperiDONE (RISPERDAL M-TABS) disintegrating tablet 3 mg  3 mg Oral QHS Curlene Labrum Lynniah Janoski, MD      . traZODone (DESYREL) tablet 50 mg  50 mg Oral QHS Curlene Labrum Tarea Skillman, MD      . DISCONTD: benztropine (COGENTIN) tablet 0.5 mg  0.5 mg Oral BID Mickeal Skinner, MD   0.5 mg at 10/09/11 1718  . DISCONTD: paliperidone (INVEGA) 24 hr tablet 6 mg  6 mg Oral Daily Mickeal Skinner, MD   6 mg at 10/09/11 1718  . DISCONTD: risperiDONE (RISPERDAL M-TABS) disintegrating tablet 1 mg  1 mg Oral BID Verne Spurr, PA-C   1 mg at 10/10/11 1636  . DISCONTD: traZODone (DESYREL) tablet 100 mg  100 mg Oral QHS Mickeal Skinner, MD   100 mg at 10/09/11 2145   Lab Results:  Results for orders placed during the hospital encounter of 10/09/11 (from the past 48 hour(s))  GLUCOSE,  CAPILLARY     Status: Abnormal   Collection Time   10/09/11 11:50 PM      Component Value Range Comment   Glucose-Capillary 105 (*) 70 - 99 mg/dL    Review of Systems:  Neurological: The patient denies any headaches today. She denies any seizures or dizziness.  G.I.: The patient denies any constipation or G.I. Upset today.  Musculoskeletal: The patient denies any muscle or skeletal difficulties.   Time was spent today discussing with the patient her current symptoms. The patient reports to sleeping well last night without difficulty and reports a good appetite.  The patient denies any significant feelings of sadness, anhedonia or depressed mood  but appears moderately depressed today.  The patient denies any suicidal or homicidal ideations as well as any auditory or visual hallucinations.  She states she is here because she has equipment nside her uterus.  She also states that unidentified people have "cracked my ovaries" and "put transmitters in my bones." She believes people are putting rat poison in her food and are talking about her and "telling me who I should be having sex with.  The patient has little insight into her illness and is requesting discharge.  She is also refusing all medications.  Treatment Plan Summary:  1. Daily contact with patient to assess and evaluate symptoms and progress in treatment.  2. Medication management  3. The patient will deny suicidal ideations or homicidal ideations for 48 hours prior to discharge and have a depression and anxiety rating of 3 or less. The patient will also deny any auditory or visual hallucinations or delusional thinking.  4. The patient will deny any symptoms of substance withdrawal at time of discharge.   Plan:  1. Will continue change the medication Risperdal M-tabs and will order 3 mgs po qhs to address her psychosis. 2. Will discontinue the medication Cogentin today. 3. Will order KCL 20 mEq po q am and hs for hypokalemia. 4. Will continue the medication Trazodone at 50 mgs po qhs for sleep. 5. Laboratory studies reviewed.  6. A petition and 1st opinion for IVC was completed today.  Once the patient is placed under IVC a forced medication order can be obtained if necessary. 7. Will continue to monitor.   Holly Hines 10/11/2011, 12:28 PM

## 2011-10-11 NOTE — Progress Notes (Signed)
[facility] Group Notes:  (Counselor/Nursing/MHT/Case Management/Adjunct)  10/11/2011 12:15 PM  Type of Therapy: Group Therapy   Participation Level: Pt did not attehd      Marni Griffon C 10/11/2011, 12:15 PM

## 2011-10-12 MED ORDER — LISINOPRIL 10 MG PO TABS: 10.0000 mg | ORAL_TABLET | Freq: Every day | ORAL | Status: DC

## 2011-10-12 MED ORDER — RISPERIDONE 3 MG PO TBDP: 3.0000 mg | ORAL_TABLET | Freq: Every day | ORAL | Status: DC

## 2011-10-12 NOTE — [facility] Suicide Risk Assessment (Signed)
Suicide Risk Assessment  Discharge Assessment     Demographic factors:   (unable to assess; pt is lethargic, medicated and preoccupied)  Current Mental Status Per Nursing Assessment::   On Admission:   (denies any si/hi and a/v) At Discharge:  The patient is AO x 3 and denies any significant feelings of sadness, anhedonia or depressed mood.  She adamantly denies any suicidal or homicidal ideations as well as any auditory or visual hallucinations.  The patient continues to have some paranoid thinking related to having an object in her uterus.  However the patient has been cooperative on the unit with the exception of refusing medications and is requesting discharge.  The patient's friend who is her primary support is now here asking for the patient's discharge to follow up with [facility].  Current Mental Status Per Physician:  Diagnosis:  Axis I: Schizophrenia - Paranoid Type.   The patient was seen today and reports the following:   ADL's: Intact.  Sleep: The patient again reports to sleeping well at night.  Appetite: The patient reports a good appetite today.   Mild>(1-10) >Severe  Hopelessness (1-10): 0  Depression (1-10): 0  Anxiety (1-10): 0   Suicidal Ideation: The patient adamantly denies any suicidal ideations today.  Plan: No  Intent: No  Means: No  Homicidal Ideation: The patient adamantly denies any homicidal ideations today.  Plan: No  Intent: No.  Means: No  General Appearance/Behavior: The patient was remained cooperative today with this Physician and answered questions appropriately.  Eye Contact: Good.  Speech: Appropriate in rate and volume with no pressuring noted today.  Motor Behavior: wnl.  Level of Consciousness: Alert and Oriented x 3.  Mental Status: Alert and Oriented x 3.  Mood: Appears mildly depressed today.  Affect: Appears mildly constricted.  Anxiety Level: No anxiety reported today.  Thought Process: Remains delusional.  Thought Content: The  patient denies any auditory or visual hallucinations today. She is continuing to experiencing paranoid delusions today.  Perception: Remains delusional today.  Judgment: Fair.  Insight: Poor to Fair.  Cognition: Oriented to person, place and time.  Sleep: Number of Hours: 6.75    Loss Factors: Recent Loss of Employment.  Historical Factors: No prior mental health care.  Risk Reduction Factors:   No past or current suicidal behavior.  Willingness to seek outpatient treatment.  Continued Clinical Symptoms:  Schizophrenia:   Paranoid or undifferentiated type Medical Diagnoses and Treatments/Surgeries  Discharge Diagnoses:   AXIS I:   Schizophrenia - Paranoid Type. AXIS II:   Deferred. AXIS III:   1. Hypertension. AXIS IV:   Unemployment.  Corporate treasurer.  Limited Insight into Delusions.  Limited Primary Support. AXIS V:   GAF at time of admission approximately 40.  GAF at time of discharge approximately 40.  Cognitive Features That Contribute To Risk:  Closed-mindedness Polarized thinking    Vital Signs:Blood pressure 140/89, pulse 86, temperature 97.8 F (36.6 C), temperature source Oral, resp. rate 17, height 5' 2.75" (1.594 m), weight 66.225 kg (146 lb), SpO2 98.00%.   Current Medications:  Current Facility-Administered Medications   Medication  Dose  Route  Frequency  Provider  Last Rate  Last Dose   .  acetaminophen (TYLENOL) tablet 650 mg  650 mg  Oral  Q6H PRN  Mickeal Skinner, MD     .  alum & mag hydroxide-simeth (MAALOX/MYLANTA) 200-200-20 MG/5ML suspension 30 mL  30 mL  Oral  Q4H PRN  Mickeal Skinner, MD     .  feeding supplement (ENSURE COMPLETE) liquid 237 mL  237 mL  Oral  BID BM  Curlene Labrum Django Nguyen, MD   237 mL at 10/10/11 1638   .  lisinopril (PRINIVIL,ZESTRIL) tablet 10 mg  10 mg  Oral  Q breakfast  Curlene Labrum Kent Riendeau, MD     .  lisinopril (PRINIVIL,ZESTRIL) tablet 10 mg  10 mg  Oral  NOW  Curlene Labrum Zyaire Mccleod, MD   10 mg at 10/11/11 1656   .  LORazepam (ATIVAN)  tablet 1 mg  1 mg  Oral  PRN  Mickeal Skinner, MD      Or   .  LORazepam (ATIVAN) injection 1 mg  1 mg  Intramuscular  PRN  Mickeal Skinner, MD     .  magnesium hydroxide (MILK OF MAGNESIA) suspension 30 mL  30 mL  Oral  Daily PRN  Mickeal Skinner, MD     .  potassium chloride SA (K-DUR,KLOR-CON) CR tablet 20 mEq  20 mEq  Oral  BH-qamhs  Yazmine Sorey D Govanni Plemons, MD     .  risperiDONE (RISPERDAL M-TABS) disintegrating tablet 3 mg  3 mg  Oral  QHS  Curlene Labrum Harolyn Cocker, MD     .  traZODone (DESYREL) tablet 50 mg  50 mg  Oral  QHS  Ronny Bacon, MD      Lab Results: No results found for this or any previous visit (from the past 48 hour(s)).   Review of Systems:  Neurological: The patient denies any headaches today. She denies any seizures or dizziness.  G.I.: The patient denies any constipation or G.I. Upset today.  Musculoskeletal: The patient denies any muscle or skeletal difficulties.   Time was spent today discussing with the patient her current symptoms. The patient reports to sleeping well without difficulty and reports a good appetite. The patient denies any significant feelings of sadness, anhedonia or depressed mood but appears mildly depressed today. The patient adamantly denies any suicidal or homicidal ideations as well as any auditory or visual hallucinations. She continues to state she is here because she has equipment inside her uterus which is telling her who I should be having sex with.  She however is not showing any evidence of being a danger to herself or others.    This afternoon, the patient's friend presented to discuss with the patient her reluctance to take medications.  Afterwards the patient's friend requested that the patient be allowed to discharge to follow up with [facility] this week.  The patient states she does not want to take medications at [facility] because she is confined.  As discussed earlier today in treatment team, the patient is delusional but is not a danger to self or others.   According to [place] commitment laws, she is not committable.  The patient will be discharged as requested.  Treatment Plan Summary:  1. Daily contact with patient to assess and evaluate symptoms and progress in treatment.  2. Medication management  3. The patient will deny suicidal ideations or homicidal ideations for 48 hours prior to discharge and have a depression and anxiety rating of 3 or less. The patient will also deny any auditory or visual hallucinations or delusional thinking.  4. The patient will deny any symptoms of substance withdrawal at time of discharge.   Plan:  1. Will continue the medication Risperdal M-tabs at 3 mgs po qhs to address her psychosis.  2. Will continue KCL 20 mEq po q am and hs for hypokalemia.  3. Will  continue the medication Trazodone at 50 mgs po qhs for sleep.  4. Laboratory studies reviewed.  5. Will continue to monitor.  6. The patient will be discharged today at her insistence to outpatient follow up at [facility].  Suicide Risk:  Minimal: No identifiable suicidal ideation.  Patients presenting with no risk factors but with morbid ruminations; may be classified as minimal risk based on the severity of the depressive symptoms  Plan Of Care/Follow-up recommendations:  Activity:  As tolerated. Diet:  Heart Healthy Diet. Other:  Please take all medications only as directed and keep all scheduled follow up appointments.  Also please schedule an appointment with your Primary Care Physician to further evaluate your high blood pressure.  Tank Difiore 10/12/2011, 4:18 PM

## 2011-10-12 NOTE — Progress Notes (Signed)
[facility] Group Notes:  (Counselor/Nursing/MHT/Case Management/Adjunct)  10/12/2011 12:15 PM  Type of Therapy: Group Therapy   Participation Level: Did not attend      Aquilla Shambley C 10/12/2011, 12:15 PM   

## 2011-10-12 NOTE — Discharge Planning (Signed)
Met with patient, who stated that the doctor has told her she can leave today.  She signed consent for Case Manager to talk to her friend Esperanza Sheets, [phone], who will pick her up at discharge.  She said he brought her here "for one night only."  She wants him to come back and pick her up.  She stated that she will go stay at the [facility], and that although she does not have an income, she has her own money to pay for the motel.  She will follow up at [facility], and said she has an appointment.  When Case Manager asked when that appointment is, she said it is a walk-in at [facility], and she does not have a specific provider there.  When Case Manager tried to encourage her to take her medications prescribed here at the hospital, she said she does not have any medications here, and that she will only deal with [facility] on her psychiatric meds.  Case Manager called her friend Esperanza Sheets, who immediately said that patient is not ready for discharge.  She is not getting any better, he stated, and he does talk to her every day.  He also stated she could "use the rest."  Suddenly he reversed himself and said that she signed herself into the hospital on a voluntary basis, so if she wanted to leave, she could not be kept in the hospital.  He added, however, that he will no longer allow her to interfere in his life.  He will not take every call she makes and he will not facilitate her by giving her money any longer.  He assured that patient is not a danger ("she wouldn't hurt a fly"), but said she is vulnerable to being hurt or exploited by others.  Case Manager explained that inability to protect oneself or even realize one needs protection is one means of being a danger to oneself.  He asked that we continue the phone call when he gets off work at [employer].  Sports coach gave him an alternate phone number to call later.  Case Manager tried to discuss some of these issues with patient, who would not stay to the end of  a sentence, but insisted she was just going to call her friend Minerva Areola and he would have to come pick her up.  Case Manager pointed out that as a friend, he has a choice in what he does.  She stated that since he brought her here, he has to pick her up.  Case Manager called, left message requesting a [facility] appointment; awaiting call back.  Ambrose Mantle, LCSW 10/12/2011, 3:05 PM

## 2011-10-12 NOTE — Progress Notes (Signed)
Patient ID: Holly Hines, female   DOB: March 12, 1971, 41 y.o.   MRN: [id number] Patient given d/c instructions, prescriptions and all belongings from locker 42. Patient refused to sign release of information sheets. Denies SI or HI.

## 2011-10-12 NOTE — Progress Notes (Signed)
Patient resting quietly with eyes closed. Respirations even and unlabored. No distress noted. Q 15 minute check continues to maintain safety   

## 2011-10-12 NOTE — Progress Notes (Signed)
[facility] MD Progress Note  10/12/2011 1:11 PM  Diagnosis:  Axis I: Schizophrenia - Paranoid Type.   The patient was seen today and reports the following:   ADL's: Intact.  Sleep: The patient reports to again sleeping well last night.  Appetite: The patient reports an ongoing good appetite today.   Mild>(1-10) >Severe  Hopelessness (1-10): 0  Depression (1-10): 0  Anxiety (1-10): 0   Suicidal Ideation: The patient adamantly denies any suicidal ideations today.  Plan: No  Intent: No  Means: No   Homicidal Ideation: The patient adamantly denies any homicidal ideations today.  Plan: No  Intent: No.  Means: No   General Appearance/Behavior: The patient was more cooperative today with this Physician and answered questions appropriately.  Eye Contact: Good.  Speech: Appropriate in rate and volume with no pressuring noted today.  Motor Behavior: wnl.  Level of Consciousness: Alert and Oriented x 3.  Mental Status: Alert and Oriented x 3.  Mood: Appears mild to moderately depressed today.  Affect: Appears mild to moderately constricted.  Anxiety Level: No anxiety reported today.  Thought Process: Remains delusional.  Thought Content: The patient denies any auditory or visual hallucinations today. She is continuing to experiencing paranoid delusions today.  Perception: Remains delusional today.  Judgment: Fair.  Insight: Poor to Fair.  Cognition: Oriented to person, place and time.  Sleep:  Number of Hours: 6.75    Vital Signs:Blood pressure 140/89, pulse 86, temperature 97.8 F (36.6 C), temperature source Oral, resp. rate 17, height 5' 2.75" (1.594 m), weight 66.225 kg (146 lb), SpO2 98.00%.  Current Medications: Current Facility-Administered Medications  Medication Dose Route Frequency Provider Last Rate Last Dose  . acetaminophen (TYLENOL) tablet 650 mg  650 mg Oral Q6H PRN Mickeal Skinner, MD      . alum & mag hydroxide-simeth (MAALOX/MYLANTA) 200-200-20 MG/5ML suspension 30  mL  30 mL Oral Q4H PRN Mickeal Skinner, MD      . feeding supplement (ENSURE COMPLETE) liquid 237 mL  237 mL Oral BID BM Curlene Labrum Janith Nielson, MD   237 mL at 10/10/11 1638  . lisinopril (PRINIVIL,ZESTRIL) tablet 10 mg  10 mg Oral Q breakfast Curlene Labrum Bionca Mckey, MD      . lisinopril (PRINIVIL,ZESTRIL) tablet 10 mg  10 mg Oral NOW Curlene Labrum Hamp Moreland, MD   10 mg at 10/11/11 1656  . LORazepam (ATIVAN) tablet 1 mg  1 mg Oral PRN Mickeal Skinner, MD       Or  . LORazepam (ATIVAN) injection 1 mg  1 mg Intramuscular PRN Mickeal Skinner, MD      . magnesium hydroxide (MILK OF MAGNESIA) suspension 30 mL  30 mL Oral Daily PRN Mickeal Skinner, MD      . potassium chloride SA (K-DUR,KLOR-CON) CR tablet 20 mEq  20 mEq Oral BH-qamhs Areatha Kalata D Shenique Childers, MD      . risperiDONE (RISPERDAL M-TABS) disintegrating tablet 3 mg  3 mg Oral QHS Curlene Labrum Ethelyn Cerniglia, MD      . traZODone (DESYREL) tablet 50 mg  50 mg Oral QHS Ronny Bacon, MD       Lab Results: No results found for this or any previous visit (from the past 48 hour(s)).  Review of Systems:  Neurological: The patient denies any headaches today. She denies any seizures or dizziness.  G.I.: The patient denies any constipation or G.I. Upset today.  Musculoskeletal: The patient denies any muscle or skeletal difficulties.   Time was spent today discussing with the patient  her current symptoms. The patient reports to sleeping well without difficulty and reports a good appetite. The patient denies any significant feelings of sadness, anhedonia or depressed mood but appears moderately depressed today. The patient denies any suicidal or homicidal ideations as well as any auditory or visual hallucinations. She continues to state she is here because she has equipment nside her uterus which is telling her who I should be having sex with. The patient states that if discharged she will stay in a hotel until more permanent placement can be found.  She also states she will follow up with  [facility] for her mental health needs.    Much discussion occurred in treatment team today concerning whether we can force medications when the persons delusions are not a danger to themselves or others.  The Case Manager was asked to meet with the patient to discuss discharge planning with possible plans to discharge tomorrow if the treatment team agrees.  Treatment Plan Summary:  1. Daily contact with patient to assess and evaluate symptoms and progress in treatment.  2. Medication management  3. The patient will deny suicidal ideations or homicidal ideations for 48 hours prior to discharge and have a depression and anxiety rating of 3 or less. The patient will also deny any auditory or visual hallucinations or delusional thinking.  4. The patient will deny any symptoms of substance withdrawal at time of discharge.   Plan:  1. Will continue the medication Risperdal M-tabs at 3 mgs po qhs to address her psychosis.  2. Will continue KCL 20 mEq po q am and hs for hypokalemia.  3. Will continue the medication Trazodone at 50 mgs po qhs for sleep.  4. Laboratory studies reviewed.  5. Will continue to monitor.   Jarrel Knoke 10/12/2011, 1:11 PM

## 2011-10-12 NOTE — Progress Notes (Signed)
[facility] Case Management Discharge Plan:  Will you be returning to the same living situation after discharge: No.  Is insisting she will go to the [facility] at discharge, and she has money for a room, confirmed by her friend Esperanza Sheets. At discharge, do you have transportation home?:Yes,  friend will transport Do you have the ability to pay for your medications:No.  Is refusing medications, wants to go to [place] and start on them there.  Has money in her account.  Interagency Information:     Release of information consent forms completed and in the chart;  Patient's signature needed at discharge.  Patient to Follow up at:  Follow-up Information    Follow up with Dr. Eulah Pont @ [facility] on 10/14/2011. [facility] is open 8:30am-3:00pm.  Your regular provider is Clerance Lav.)    Contact information:   [facility] [address] [place]  [address] Telephone:  [phone]         Patient denies SI/HI:   Yes,      Safety Planning and Suicide Prevention discussed:  No.  Patient was not suicidal on admission, has refused to attend groups or talk at length with Case Manager, has refused this information.  It will be a part of her discharge paperwork, and friend was told about it.  Barrier to discharge identified:No.  Summary and Recommendations:  Patient to go to [facility] for follow-up.   Sarina Ser 10/12/2011, 4:20 PM

## 2011-10-12 NOTE — Progress Notes (Signed)
Patient ID: Holly Hines, female   DOB: Mar 04, 1971, 41 y.o.   MRN: [id number] D-Pt has been in her room all day, sitting or lying in bed  Except for meals.  She is polite, says she wants to leave and has refused medication today.  She saw MD and reported MD wants her to meet with case manager.  She requested to do so.  A -encouraged pt to take meds, offered her ensure, called case manager to meet with patient.  R- Reported to pt that case manager would try to see her after lunch. Pt went to eat lunch.

## 2011-10-14 NOTE — Progress Notes (Signed)
Patient Discharge Instructions: patient refused consent for [facility]  Holly Hines, 10/14/2011, 12:09 PM

## 2011-10-19 NOTE — Discharge Summary (Signed)
Physician Discharge Summary Note  Patient:  Holly Hines is an 41 y.o., female 3 MRN:  [id number] DOB:  October 16, 1970 Patient phone:  [phone] (home)  Patient address:   [address] [address],   Date of Admission:  10/09/2011 Date of Discharge: 10/12/2011  Reason for Admission: Paranoid psychosis  Discharge Diagnoses: Principal Problem:  *Schizophrenia, paranoid Discharge Diagnoses:  AXIS I: Schizophrenia - Paranoid Type.  AXIS II: Deferred.  AXIS III: 1. Hypertension.  AXIS IV: Unemployment. Corporate treasurer. Limited Insight into Delusions. Limited Primary Support.  AXIS V: GAF at time of admission approximately 40. GAF at time of discharge approximately 40.    Level of Care: Outpatient  Hospital Course:  The patient was admitted for evaluation after presenting to the ED requesting assistance with the foreign bodies she has implanted in her body.  She has been admitted to [facility] for further evaluation and treatment.  She denied AH/VH states she is not suicidal and has not reported any suicidal symptoms or homicidal symptoms. She has persistently declined medication.  While she is delusional she is not harmful to herself or other patients.  She is easily redirected, but does continue to respond to internal stimulation.  On the 3rd day of admission a friend of hers came forward to verify her given history.  Holly Hines refused to attend meetings, refused to meet with counselor or therapist and did not want a trial of medication.  As she was cooperative, showed insight, it was felt that forcing medications was not appropriate at this time.  Holly Hines was requesting discharge out to leave with her friend.  As she no longer met the requirement for Involuntary commitment she as cleared and allowed to leave with plans to follow up at [facility]/  Consults:    Significant Diagnostic Studies: none  Discharge Vitals:   Blood pressure 140/89, pulse 86, temperature 97.8 F (36.6 C),  temperature source Oral, resp. rate 17, height 5' 2.75" (1.594 m), weight 66.225 kg (146 lb), SpO2 98.00%.  Mental Status Exam: See Mental Status Examination and Suicide Risk Assessment completed by Attending Physician prior to discharge.  Discharge destination:  Home with friend Is patient on multiple antipsychotic therapies at discharge:  No   Has Patient had three or more failed trials of antipsychotic monotherapy by history:  No Recommended Plan for Multiple Antipsychotic Therapies: not applicable    Discharge Orders    Future Orders Please Complete By Expires   Diet - low sodium heart healthy      Increase activity slowly      Discharge instructions      Comments:   Please take all medications only as directed and keep your scheduled follow up appointments a [facility] this week.     Medication List  As of 10/19/2011 10:22 PM   TAKE these medications      Indication    lisinopril 10 MG tablet   Commonly known as: PRINIVIL,ZESTRIL   Take 1 tablet (10 mg total) by mouth daily with breakfast. For blood pressure control.       risperidone 3 MG disintegrating tablet   Commonly known as: RISPERDAL M-TABS   Take 1 tablet (3 mg total) by mouth at bedtime. For psychosis.            Follow-up Information    Follow up with Dr. Eulah Pont @ [facility] on 10/14/2011. [facility] is open 8:30am-3:00pm.  Your regular provider is Holly Hines.)    Contact information:   [facility]er  [address] [place]  [address] Telephone:  [phone]      Follow up with Holly Lav, NP on 10/26/2011. (5:45PM appointment)    Contact information:   [facility] [address]  [address] Telephone:  [phone]         Follow-up recommendations:    Comments:   Signed: Rona Ravens. Allannah Kempen PAC For Dr. Harvie Heck D. Readling 10/19/2011, 10:22 PM

## 2012-06-14 ENCOUNTER — Emergency Department (HOSPITAL_COMMUNITY)
Admission: EM | Admit: 2012-06-14 | Discharge: 2012-06-14 | Disposition: A | Discharge status: Home / Self Care | Payer: Self-pay | Attending: Emergency Medicine | Admitting: Emergency Medicine

## 2012-06-14 ENCOUNTER — Encounter (HOSPITAL_COMMUNITY): Payer: Self-pay | Admitting: *Deleted

## 2012-06-14 DIAGNOSIS — M129 Arthropathy, unspecified: Secondary | ICD-10-CM | POA: Insufficient documentation

## 2012-06-14 DIAGNOSIS — Z5900 Homelessness unspecified: Secondary | ICD-10-CM | POA: Insufficient documentation

## 2012-06-14 DIAGNOSIS — Z8659 Personal history of other mental and behavioral disorders: Secondary | ICD-10-CM | POA: Insufficient documentation

## 2012-06-14 LAB — POCT I-STAT, CHEM 8
Calcium, Ion: 1.25 mmol/L — ABNORMAL HIGH (ref 1.12–1.23)
HCT: 36 % (ref 36.0–46.0)
TCO2: 21 mmol/L (ref 0–100)

## 2012-06-14 LAB — SEDIMENTATION RATE: Sed Rate: 47 mm/hr — ABNORMAL HIGH (ref 0–22)

## 2012-06-14 LAB — CBC WITH DIFFERENTIAL/PLATELET
Basophils Absolute: 0 10*3/uL (ref 0.0–0.1)
Lymphocytes Relative: 21 % (ref 12–46)
MCH: 29.9 pg (ref 26.0–34.0)
MCHC: 35.4 g/dL (ref 30.0–36.0)
MCV: 84.3 fL (ref 78.0–100.0)
Monocytes Absolute: 0.3 10*3/uL (ref 0.1–1.0)
Neutro Abs: 3.7 10*3/uL (ref 1.7–7.7)
Platelets: 253 10*3/uL (ref 150–400)
RBC: 3.95 MIL/uL (ref 3.87–5.11)

## 2012-06-14 MED ORDER — NAPROXEN 500 MG PO TABS: 500.0000 mg | ORAL_TABLET | Freq: Two times a day (BID) | ORAL | Status: DC

## 2012-06-14 MED ORDER — IBUPROFEN 800 MG PO TABS
800.0000 mg | ORAL_TABLET | Freq: Once | ORAL | Status: AC
  Administered 2012-06-14: 800 mg via ORAL
  Filled 2012-06-14: qty 1

## 2012-06-14 MED ORDER — POTASSIUM CHLORIDE CRYS ER 20 MEQ PO TBCR
40.0000 meq | EXTENDED_RELEASE_TABLET | Freq: Once | ORAL | Status: DC
  Filled 2012-06-14: qty 2

## 2012-06-14 NOTE — ED Notes (Signed)
Per EMS pt c/o generalized pain all over

## 2012-06-14 NOTE — ED Notes (Signed)
MD at bedside. 

## 2012-06-14 NOTE — ED Notes (Signed)
Pt came out of room  Asked pt to come to desk and sign her discharge  Pt refused and walked away

## 2012-06-14 NOTE — ED Provider Notes (Signed)
History     CSN: [id number]  Arrival date & time 06/14/12  0310   First MD Initiated Contact with Patient 06/14/12 [phone]      Chief Complaint  Patient presents with  . Pain   HPI  History provided by the patient. Patient is a 42 year old female with no significant PMH who presents with complaints of diffuse arthritic pains. Patient states she is originally from [place] area has been increasing his profunda past one year. She is currently unemployed and homeless has been staying in several shelters. Today she presents with complaints of joint pains in her lower and upper extremities. Pains have come and gone for several months. She does not know of any family history of arthritis conditions. She denies any swelling or skin changes over the joints. Patient states that her symptoms seem worse after eating any type of food. Pain does not seem to be changed with movements or activity. Patient has not used any medications for symptoms. There have been no associated fever, chills or sweats. No unexpected weight loss.    Past Medical History  Diagnosis Date  . Mental disorder     Past Surgical History  Procedure Laterality Date  . Cesarean section  9 yrs ago     No family history on file.  History  Substance Use Topics  . Smoking status: Never Smoker   . Smokeless tobacco: Not on file  . Alcohol Use: No    OB History   Grav Para Term Preterm Abortions TAB SAB Ect Mult Living                  Review of Systems  Constitutional: Negative for fever, chills, diaphoresis and unexpected weight change.  Musculoskeletal: Positive for arthralgias. Negative for back pain and joint swelling.  Skin: Negative for rash.  All other systems reviewed and are negative.    Allergies  Review of patient's allergies indicates no known allergies.  Home Medications  No current outpatient prescriptions on file.  BP 156/79  Pulse 87  Temp(Src) 98.7 F (37.1 C) (Oral)  Resp 18  Ht 5\' 4"   (1.626 m)  SpO2 100%  Physical Exam  Nursing note and vitals reviewed. Constitutional: She is oriented to person, place, and time. She appears well-developed and well-nourished. No distress.  HENT:  Head: Normocephalic.  Eyes: Conjunctivae are normal.  Neck: Normal range of motion. Neck supple.  Cardiovascular: Normal rate and regular rhythm.   No murmur heard. Pulmonary/Chest: Effort normal and breath sounds normal. No respiratory distress. She has no wheezes. She has no rales.  Abdominal: Soft.  Musculoskeletal: Normal range of motion. She exhibits no edema and no tenderness.  Neurological: She is alert and oriented to person, place, and time. She has normal strength. No sensory deficit.  Skin: Skin is warm and dry. No rash noted.  Psychiatric: She has a normal mood and affect. Her behavior is normal.    ED Course  Procedures   Results for orders placed during the hospital encounter of 06/14/12  CBC WITH DIFFERENTIAL      Result Value Range   WBC 5.1  4.0 - 10.5 K/uL   RBC 3.95  3.87 - 5.11 MIL/uL   Hemoglobin 11.8 (*) 12.0 - 15.0 g/dL   HCT 82.9 (*) 56.2 - 13.0 %   MCV 84.3  78.0 - 100.0 fL   MCH 29.9  26.0 - 34.0 pg   MCHC 35.4  30.0 - 36.0 g/dL   RDW 86.5  78.4 -  15.5 %   Platelets 253  150 - 400 K/uL   Neutrophils Relative 73  43 - 77 %   Neutro Abs 3.7  1.7 - 7.7 K/uL   Lymphocytes Relative 21  12 - 46 %   Lymphs Abs 1.1  0.7 - 4.0 K/uL   Monocytes Relative 6  3 - 12 %   Monocytes Absolute 0.3  0.1 - 1.0 K/uL   Eosinophils Relative 0  0 - 5 %   Eosinophils Absolute 0.0  0.0 - 0.7 K/uL   Basophils Relative 0  0 - 1 %   Basophils Absolute 0.0  0.0 - 0.1 K/uL  SEDIMENTATION RATE      Result Value Range   Sed Rate 47 (*) 0 - 22 mm/hr  POCT I-STAT, CHEM 8      Result Value Range   Sodium 137  135 - 145 mEq/L   Potassium 3.3 (*) 3.5 - 5.1 mEq/L   Chloride 105  96 - 112 mEq/L   BUN 7  6 - 23 mg/dL   Creatinine, Ser 8.41  0.50 - 1.10 mg/dL   Glucose, Bld 89  70 -  99 mg/dL   Calcium, Ion 3.24 (*) 1.12 - 1.23 mmol/L   TCO2 21  0 - 100 mmol/L   Hemoglobin 12.2  12.0 - 15.0 g/dL   HCT 40.1  02.7 - 25.3 %        1. Arthritis       MDM  4:00 AM patient seen and evaluated. Patient appears well in no acute distress. Symptoms are somewhat chronic but more acutely worsening. No concerning exam findings. Will obtain some basic labs and treat arthritis symptoms.  Patient having some improvement of symptoms. No concerning findings on lab tests. Patient advised to continue followup with PCP for continued testing and evaluation.      Angus Seller, [place] 06/14/12 2011

## 2012-06-14 NOTE — ED Notes (Signed)
Went in to discharge pt and she states her medication has made her too drowsy to leave at this time  Pt states she is taking the bus home and does not feel safe to go right now  Informed pt she could stay for about 30 more minutes then she would be discharged

## 2012-06-14 NOTE — ED Notes (Signed)
Bed:WA25<BR> Expected date:<BR> Expected time:<BR> Means of arrival:<BR> Comments:<BR>

## 2012-06-15 NOTE — ED Notes (Signed)
I approached pt due to her sitting in the waiting room for the past 2days. Pt states she was thinking of checking back in because she was released too soon and wasn't ready to leave. I was notified that last week pt was allowed to stay in the waiting room due to snow and buses was not running. Pt was also taken to [employer] for shelter by [employer]. Pt had different stories when I asked about this, pt very rude and demanding wanting things. Pt requested SW wanting "transportation funds to get out of this city". Gina,SW came out to see pt and pt was gone and all of her belongings that had been in the corner. Pt was given a bus pass, cup of ice water and a copy of her d/c papers for 2/19 per pt request. Pt a/o in no distress, pt ambulatory with no difficulties.

## 2012-06-19 NOTE — ED Provider Notes (Signed)
Medical screening examination/treatment/procedure(s) were performed by non-physician practitioner and as supervising physician I was immediately available for consultation/collaboration.  Hurman Horn, MD 06/19/12 [phone]

## 2012-06-27 ENCOUNTER — Encounter (HOSPITAL_COMMUNITY): Payer: Self-pay | Admitting: *Deleted

## 2012-06-27 ENCOUNTER — Emergency Department (HOSPITAL_COMMUNITY)
Admission: EM | Admit: 2012-06-27 | Discharge: 2012-07-04 | Disposition: A | Discharge status: Home / Self Care | Payer: Self-pay | Attending: Emergency Medicine | Admitting: Emergency Medicine

## 2012-06-27 DIAGNOSIS — F200 Paranoid schizophrenia: Secondary | ICD-10-CM | POA: Insufficient documentation

## 2012-06-27 LAB — URINALYSIS, ROUTINE W REFLEX MICROSCOPIC
Bilirubin Urine: NEGATIVE
Glucose, UA: NEGATIVE mg/dL
Leukocytes, UA: NEGATIVE
Nitrite: NEGATIVE
Protein, ur: 30 mg/dL — AB

## 2012-06-27 LAB — COMPREHENSIVE METABOLIC PANEL
ALT: 6 U/L (ref 0–35)
AST: 14 U/L (ref 0–37)
Alkaline Phosphatase: 47 U/L (ref 39–117)
CO2: 15 mEq/L — ABNORMAL LOW (ref 19–32)
Calcium: 9.6 mg/dL (ref 8.4–10.5)
Chloride: 97 mEq/L (ref 96–112)
GFR calc Af Amer: >90 mL/min (ref >90)
GFR calc non Af Amer: >90 mL/min (ref >90)
Glucose, Bld: 80 mg/dL (ref 70–99)
Potassium: 3.7 mEq/L (ref 3.5–5.1)
Sodium: 133 mEq/L — ABNORMAL LOW (ref 135–145)
Total Bilirubin: 0.4 mg/dL (ref 0.3–1.2)

## 2012-06-27 LAB — CBC WITH DIFFERENTIAL/PLATELET
Basophils Absolute: 0 10*3/uL (ref 0.0–0.1)
Eosinophils Relative: 1 % (ref 0–5)
Lymphocytes Relative: 39 % (ref 12–46)
Lymphs Abs: 3.1 10*3/uL (ref 0.7–4.0)
MCV: 85.9 fL (ref 78.0–100.0)
Neutro Abs: 4.2 10*3/uL (ref 1.7–7.7)
Platelets: 314 10*3/uL (ref 150–400)
RBC: 4.19 MIL/uL (ref 3.87–5.11)
WBC: 8 10*3/uL (ref 4.0–10.5)

## 2012-06-27 LAB — RAPID URINE DRUG SCREEN, HOSP PERFORMED
Amphetamines: NOT DETECTED
Barbiturates: NOT DETECTED

## 2012-06-27 MED ORDER — ONDANSETRON HCL 4 MG PO TABS: 4.0000 mg | ORAL_TABLET | Freq: Three times a day (TID) | ORAL | Status: DC | PRN

## 2012-06-27 MED ORDER — IBUPROFEN 600 MG PO TABS: 600.0000 mg | ORAL_TABLET | Freq: Three times a day (TID) | ORAL | Status: DC | PRN

## 2012-06-27 MED ORDER — ZOLPIDEM TARTRATE 5 MG PO TABS: 5.0000 mg | ORAL_TABLET | Freq: Every evening | ORAL | Status: DC | PRN

## 2012-06-27 MED ORDER — ZIPRASIDONE MESYLATE 20 MG IM SOLR
20.0000 mg | Freq: Once | INTRAMUSCULAR | Status: AC
  Administered 2012-06-27: 20 mg via INTRAMUSCULAR
  Filled 2012-06-27: qty 20

## 2012-06-27 MED ORDER — ZIPRASIDONE MESYLATE 20 MG IM SOLR
INTRAMUSCULAR | Status: AC
  Administered 2012-06-27: 20 mg via INTRAMUSCULAR
  Filled 2012-06-27: qty 20

## 2012-06-27 MED ORDER — ACETAMINOPHEN 325 MG PO TABS: 650.0000 mg | ORAL_TABLET | ORAL | Status: DC | PRN

## 2012-06-27 NOTE — ED Notes (Signed)
Pt alert, and resting on stretcher. Cardiac monitoring in progress.

## 2012-06-27 NOTE — ED Notes (Signed)
Attempted to change pt into blue scrubs.  Pt kept saying that she wanted to see the paperwork that said she had to stay here.  Pt also stated that she wanted to see the paperwork that said she needed her blood drawn.  Pt stated that she had already gave blood last week.  Pt refused to be changed.  Attempted for several minutes to get the pt changed into blue scrubs.  After show of force with 3 techs, 1 nurse, 1 [employer] officer, and 1 security guard pt still refused to be changed.  When attempting to lift up the pt she became combative and swing her arms.  Pt had her hands in fists.  Pt was moved to the bed and had to held down.  While pt was being held down she was scratching at staff.  Pt also started to spit.  Pt's clothes where removed and pt was placed in blue scrubs.  Pt was restrained to the bed.  Pt was medicated in left upper arm.  Pt belongings inventoried:  1 pair of white socks, 1 pair of grey sweat pants, 1 black head cover, 1 black jacket, 1 blue jacket, 1 pair of black nike shoes, 1 pair of grey socks, 1 samsung charger, 1 lg smart phone and charger, 1 brush, 1 note pad and mail, one zip lock bag of make-up, 1 zip lock bag with 1 lighter, 1 toothbrush, shampoo, and toothpaste,  1 samsung phone, 1 black marker, 1 deck of cards, 1 bottle of lotion, 1 bottle of hand sanitizer, 1 name badge, misc. Mail. 3 combs, 3 .09 oz toothpaste, 1 travel shampoo, 1 pair of black earings, [place] id card, gta id card, ebt card, [place] debit visa card, [place] drivers license, 1 purple pouch of pictures, 1 red knit hat, 4 pair of jeans, 7 pair of white socks, 1 orange shirt, 1 pink shirt, 1 pink zip up sweater, 1 grey t-shirt, 1 blue long sleeve shirt, 1 green pair of underwear, 1 blue pair of underwear, 1 white t-shirt, 2 pairs of grey sweat pants, 1 black bra, 1 white bra, 1 pair of white underwear, 1 black hair extension piece, 28 dominos in a pill bottle, toothpaste .09 oz, tooth brush, 1 black sock, 1 white socks, 1 brown sock,  1 pair of blue socks.  All bags marked with stickers and placed by nurses station.

## 2012-06-27 NOTE — Progress Notes (Signed)
[name] spoke with [facility], and confirmed that patient is on the waitling list at [facility] since 06/25/2012, per [place].   Catha Gosselin, LCSWA  [phone] .06/27/2012 1948pm

## 2012-06-27 NOTE — ED Provider Notes (Signed)
History     CSN: [id number]  Arrival date & time 06/27/12  1538   First MD Initiated Contact with Patient 06/27/12 1805      Chief Complaint  Patient presents with  . Medical Clearance    (Consider location/radiation/quality/duration/timing/severity/associated sxs/prior treatment) HPI Pt referred to ED for medical clearance. IVC place by brother for delusional thought pattern and suicidal ideation. Pt is refusing to answer questions and not consenting to lab draw. Refusing physical exam. Level 5 caveat.   Past Medical History  Diagnosis Date  . Mental disorder     Past Surgical History  Procedure Laterality Date  . Cesarean section  9 yrs ago     History reviewed. No pertinent family history.  History  Substance Use Topics  . Smoking status: Never Smoker   . Smokeless tobacco: Not on file  . Alcohol Use: No    OB History   Grav Para Term Preterm Abortions TAB SAB Ect Mult Living                  Review of Systems  Unable to perform ROS: Psychiatric disorder    Allergies  Review of patient's allergies indicates no known allergies.  Home Medications  No current outpatient prescriptions on file.  BP 113/69  Pulse 74  Temp(Src) 98.3 F (36.8 C) (Oral)  Resp 23  SpO2 99%  Physical Exam  Nursing note and vitals reviewed. Constitutional: She appears well-developed and well-nourished.  HENT:  Head: Normocephalic and atraumatic.  Neck: Normal range of motion.  Pulmonary/Chest: Effort normal.  Neurological: She is alert.  Moves all ext, walking around room, no focal deficits  Psychiatric:  Uncooperative, difficult to assess.     ED Course  Procedures (including critical care time)  Labs Reviewed  URINALYSIS, ROUTINE W REFLEX MICROSCOPIC - Abnormal; Notable for the following:    APPearance CLOUDY (*)    Ketones, ur TRACE (*)    Protein, ur 30 (*)    All other components within normal limits  COMPREHENSIVE METABOLIC PANEL - Abnormal; Notable for the  following:    Sodium 133 (*)    CO2 15 (*)    All other components within normal limits  URINE MICROSCOPIC-ADD ON - Abnormal; Notable for the following:    Squamous Epithelial / LPF FEW (*)    Bacteria, UA FEW (*)    All other components within normal limits  URINE RAPID DRUG SCREEN (HOSP PERFORMED)  CBC WITH DIFFERENTIAL  ETHANOL   No results found.   No diagnosis found.    MDM  Given previous IVC and refusal to cooperate will precede with medical screening.         Loren Racer, MD 06/27/12 2312

## 2012-06-27 NOTE — ED Notes (Signed)
Pt remains defiant and uncooperative. [employer] outside of pt's room.

## 2012-06-27 NOTE — ED Notes (Signed)
Upon talking with nurse, Pt is still refusing blood draw. MD made aware

## 2012-06-27 NOTE — ED Notes (Signed)
4 bags in locker 38

## 2012-06-27 NOTE — ED Notes (Addendum)
Pt arrives via [employer] handcuffed. From [place], IVC paperwork sts pt is not to return to [facility] as she is awaiting [facility] placement. Pt refusing all meds, vitals, interviews. Pt uncooperative with this RN upon arrival. Refuses to remove coat for vitals. [employer] at bedside. Joni Reining, [place] made aware of pts behavior and inability to assess pt. Per PA, MD does not want to sedate pt at this time. Charge RN made aware. Will be unable to further assess pt at this time without intervention.

## 2012-06-27 NOTE — ED Notes (Signed)
Pt arrived to unit. Pt agitated and guarded. Pt labile and paranoid about assessment questions.

## 2012-06-27 NOTE — ED Notes (Signed)
In with pt to explain that labs need to be collected and clothes need to be changed, pt refused and became combative with staff. MD notified and orders given for pt and staff safety. Pt put in bed and medication administered.

## 2012-06-27 NOTE — ED Notes (Signed)
Verbal report given to [facility] RN-Psych ER

## 2012-06-28 DIAGNOSIS — Z9119 Patient's noncompliance with other medical treatment and regimen: Secondary | ICD-10-CM

## 2012-06-28 LAB — BASIC METABOLIC PANEL
BUN: 8 mg/dL (ref 6–23)
CO2: 22 mEq/L (ref 19–32)
Chloride: 101 mEq/L (ref 96–112)
GFR calc non Af Amer: >90 mL/min (ref >90)
Glucose, Bld: 98 mg/dL (ref 70–99)
Potassium: 3.9 mEq/L (ref 3.5–5.1)
Sodium: 134 mEq/L — ABNORMAL LOW (ref 135–145)

## 2012-06-28 MED ORDER — ZIPRASIDONE MESYLATE 20 MG IM SOLR
20.0000 mg | Freq: Two times a day (BID) | INTRAMUSCULAR | Status: DC
  Administered 2012-06-28 – 2012-07-02 (×7): 20 mg via INTRAMUSCULAR
  Filled 2012-06-28 (×8): qty 20

## 2012-06-28 NOTE — ED Provider Notes (Signed)
Pt is here on IVC.  She has been refusing to cooperate and take her medications.  Dr Shela Commons has evaluated the patient and feels that the patient should be given her medications despite her objections.  Considering her mental health history I agree with this plan.  Pt is currently resting and in no distress.  If she refuses to take her medications I agree we should administer IM injections.  Celene Kras, MD 06/28/12 [phone]

## 2012-06-28 NOTE — ED Notes (Signed)
Patient refusing lab draw this am. Pt stating "You ain't getting no more blood from me! They took blood, read my chart".

## 2012-06-28 NOTE — ED Notes (Signed)
Pt refusing to talk to Dr. Jacky Kindle for a telepsych consult. Pt with dull, flat affect.

## 2012-06-28 NOTE — Consult Note (Signed)
Reason for Consult: Paranoid schizophrenia noncompliant with medications Referring Physician: Dr. Melodye Ped Durenda Age is an 42 y.o. female.  HPI: Patient was seen and chart reviewed. Patient was so brought in by [facility] department with the involuntary commitment petitioned from [employer]. Patient has been uncooperative and dangerous to herself and other people. Patient has been noncompliant with medication management. Patient was unable to provide linear and goal-directed history. Patient was a poor historian. Spoke with the ER physician regarding second opinion for forced medication management. Patient using that screen was negative for drugs of abuse. [facility] has [facility] while staying in the [facility] on 06/25/2012.  MSE: Patient appeared lying down in her bed coating with the bed sheets up to her neck. Patient was talking with low voices, and mumbling. It is difficult to complete comprehensive mental status examination.  Past Medical History  Diagnosis Date  . Mental disorder     Past Surgical History  Procedure Laterality Date  . Cesarean section  9 yrs ago     History reviewed. No pertinent family history.  Social History:  reports that she has never smoked. She does not have any smokeless tobacco history on file. She reports that she does not drink alcohol or use illicit drugs.  Allergies: No Known Allergies  Medications: I have reviewed the patient's current medications.  Results for orders placed during the hospital encounter of 06/27/12 (from the past 48 hour(s))  URINALYSIS, ROUTINE W REFLEX MICROSCOPIC     Status: Abnormal   Collection Time    06/27/12  5:20 PM      Result Value Range   Color, Urine YELLOW  YELLOW   APPearance CLOUDY (*) CLEAR   Specific Gravity, Urine 1.025  1.005 - 1.030   pH 7.0  5.0 - 8.0   Glucose, UA NEGATIVE  NEGATIVE mg/dL   Hgb urine dipstick NEGATIVE  NEGATIVE    Bilirubin Urine NEGATIVE  NEGATIVE   Ketones, ur TRACE (*) NEGATIVE mg/dL   Protein, ur 30 (*) NEGATIVE mg/dL   Urobilinogen, UA 1.0  0.0 - 1.0 mg/dL   Nitrite NEGATIVE  NEGATIVE   Leukocytes, UA NEGATIVE  NEGATIVE  URINE RAPID DRUG SCREEN (HOSP PERFORMED)     Status: None   Collection Time    06/27/12  5:20 PM      Result Value Range   Opiates NONE DETECTED  NONE DETECTED   Cocaine NONE DETECTED  NONE DETECTED   Benzodiazepines NONE DETECTED  NONE DETECTED   Amphetamines NONE DETECTED  NONE DETECTED   Tetrahydrocannabinol NONE DETECTED  NONE DETECTED   Barbiturates NONE DETECTED  NONE DETECTED   Comment:            DRUG SCREEN FOR MEDICAL PURPOSES     ONLY.  IF CONFIRMATION IS NEEDED     FOR ANY PURPOSE, NOTIFY LAB     WITHIN 5 DAYS.                LOWEST DETECTABLE LIMITS     FOR URINE DRUG SCREEN     Drug Class       Cutoff (ng/mL)     Amphetamine      1000     Barbiturate      200     Benzodiazepine   200     Tricyclics       300     Opiates  300     Cocaine          300     THC              50  URINE MICROSCOPIC-ADD ON     Status: Abnormal   Collection Time    06/27/12  5:20 PM      Result Value Range   Squamous Epithelial / LPF FEW (*) RARE   WBC, UA 0-2  <3 WBC/hpf   Bacteria, UA FEW (*) RARE   Urine-Other MUCOUS PRESENT    CBC WITH DIFFERENTIAL     Status: None   Collection Time    06/27/12  7:37 PM      Result Value Range   WBC 8.0  4.0 - 10.5 K/uL   RBC 4.19  3.87 - 5.11 MIL/uL   Hemoglobin 12.5  12.0 - 15.0 g/dL   HCT 16.1  09.6 - 04.5 %   MCV 85.9  78.0 - 100.0 fL   MCH 29.8  26.0 - 34.0 pg   MCHC 34.7  30.0 - 36.0 g/dL   RDW 40.9  81.1 - 91.4 %   Platelets 314  150 - 400 K/uL   Neutrophils Relative 52  43 - 77 %   Neutro Abs 4.2  1.7 - 7.7 K/uL   Lymphocytes Relative 39  12 - 46 %   Lymphs Abs 3.1  0.7 - 4.0 K/uL   Monocytes Relative 8  3 - 12 %   Monocytes Absolute 0.6  0.1 - 1.0 K/uL   Eosinophils Relative 1  0 - 5 %   Eosinophils  Absolute 0.1  0.0 - 0.7 K/uL   Basophils Relative 0  0 - 1 %   Basophils Absolute 0.0  0.0 - 0.1 K/uL  COMPREHENSIVE METABOLIC PANEL     Status: Abnormal   Collection Time    06/27/12  7:37 PM      Result Value Range   Sodium 133 (*) 135 - 145 mEq/L   Potassium 3.7  3.5 - 5.1 mEq/L   Chloride 97  96 - 112 mEq/L   CO2 15 (*) 19 - 32 mEq/L   Glucose, Bld 80  70 - 99 mg/dL   BUN 8  6 - 23 mg/dL   Creatinine, Ser 7.82  0.50 - 1.10 mg/dL   Calcium 9.6  8.4 - 95.6 mg/dL   Total Protein 8.2  6.0 - 8.3 g/dL   Albumin 3.6  3.5 - 5.2 g/dL   AST 14  0 - 37 U/L   ALT 6  0 - 35 U/L   Alkaline Phosphatase 47  39 - 117 U/L   Total Bilirubin 0.4  0.3 - 1.2 mg/dL   GFR calc non Af Amer >90  >90 mL/min   GFR calc Af Amer >90  >90 mL/min   Comment:            The eGFR has been calculated     using the CKD EPI equation.     This calculation has not been     validated in all clinical     situations.     eGFR's persistently     <90 mL/min signify     possible Chronic Kidney Disease.  ETHANOL     Status: None   Collection Time    06/27/12  7:37 PM      Result Value Range   Alcohol, Ethyl (B) <11  0 - 11 mg/dL   Comment:  LOWEST DETECTABLE LIMIT FOR     SERUM ALCOHOL IS 11 mg/dL     FOR MEDICAL PURPOSES ONLY  BASIC METABOLIC PANEL     Status: Abnormal   Collection Time    06/28/12  9:04 AM      Result Value Range   Sodium 134 (*) 135 - 145 mEq/L   Potassium 3.9  3.5 - 5.1 mEq/L   Chloride 101  96 - 112 mEq/L   CO2 22  19 - 32 mEq/L   Glucose, Bld 98  70 - 99 mg/dL   Comment: AGE OF SPECIMEN MAY AFFECT INTEGRITY OF RESULTS   BUN 8  6 - 23 mg/dL   Creatinine, Ser 1.61  0.50 - 1.10 mg/dL   Calcium 9.9  8.4 - 09.6 mg/dL   GFR calc non Af Amer >90  >90 mL/min   GFR calc Af Amer >90  >90 mL/min   Comment:            The eGFR has been calculated     using the CKD EPI equation.     This calculation has not been     validated in all clinical     situations.     eGFR's  persistently     <90 mL/min signify     possible Chronic Kidney Disease.    No results found.  Positive for aggressive behavior, learning difficulty, mood swings and Paranoid schizophrenia Blood pressure 144/90, pulse 79, temperature 97.7 F (36.5 C), temperature source Oral, resp. rate 16, SpO2 99.00%.   Assessment/Plan: Chronic paranoid schizophrenia Noncompliant with medication  Recommendations: It is recommended patient needs forced medication management as she has been danger to herself and others and unable to care for herself and make appropriate clinical conditions. Will start Geodon 20 mg intramuscular twice daily for paranoid psychosis with the second opinion from ER physician as required by the state.  JONNALAGADDA,JANARDHAHA R. 06/28/2012, 4:34 PM

## 2012-06-28 NOTE — ED Provider Notes (Signed)
Patient seen in [facility] ED.  Patient is currently under involuntary commitment for acute psychosis.  Patient is sleeping, resting comfortably without issues.  Awaiting placement.   Gilda Crease, MD 06/28/12 [phone]

## 2012-06-28 NOTE — Progress Notes (Signed)
[name] called to f/u w/[facility]  [phone]).  Per Willa Rough pt is on the wait list.  Vickii Penna, LCSWA [phone]  Clinical Social Work

## 2012-06-28 NOTE — BH Assessment (Signed)
Assessment Note   Holly Hines is a 42 y.o. female who presents via IVC from [place].  Per Vesta Mixer, pt has been in their facility for 1 week awaiting a bed with [facility] and receiving treatment with no improvement.  Pt is refusing all medications and interventions provided by [facility].  Due to pt.'s uncooperative behavior with Vesta Mixer, she has been sent to emerg dept continue waiting for a bed with [facility].  Pt will be provided medications and any other necessary medical care.  The following information is collateral as pt will not willingly communicate with staff personnel.    Upon arrival to [facility], pt was unwilling will medical staff, refusing medical clearance treatment and assessments from medical team.  It is noted by EMT, that after several minutes of attempting to provide medical care, physical restraints applied by staff.  Pt became combative and scratching at staff and swinging fists at medical staff.  Pt was agitated, guarded, labile and paranoid.    Pt IVC'd by brother and presented [facility] with paranoia, stating that the FBI was after her because she refused to have sex with them. States the government place an apparatus inside her body and people are watching her through phone lines.  Pt reported she felt as though numerous people were after her.  Pt also expressed thoughts of wanting to harm other, no specific plan.  Pt throwing food and would not leave room while at [facility].  Pt is homeless and [place].  Pt is non-compliant with medications but has been prescribed Abilify(5mg ) while at [facility], however unknown what additional medications pt is taking.  Pt denies SI/SA, but has past hx of alcohol dependence and per petition expressed thoughts of hurt self, no specific plan.       A telepsych was attempted by Dr. Jacky Kindle, however pt refused to talk with telepsychiatrist. Pt will remain in psych ed until bed becomes avail with [facility] and will be reviewed  daily for possible improvement by psych staff to determine if continued inpt treatment is needed.    Axis I: Chronic Paranoid Schizophrenia Axis II: Deferred Axis III:  Past Medical History  Diagnosis Date  . Mental disorder    Axis IV: economic problems, housing problems, other psychosocial or environmental problems, problems related to social environment, problems with access to health care services and problems with primary support group Axis V: 21-30 behavior considerably influenced by delusions or hallucinations OR serious impairment in judgment, communication OR inability to function in almost all areas  Past Medical History:  Past Medical History  Diagnosis Date  . Mental disorder     Past Surgical History  Procedure Laterality Date  . Cesarean section  9 yrs ago     Family History: History reviewed. No pertinent family history.  Social History:  reports that she has never smoked. She does not have any smokeless tobacco history on file. She reports that she does not drink alcohol or use illicit drugs.  Additional Social History:  Alcohol / Drug Use Pain Medications: None  Prescriptions: Abilify 15mg --prescribed by Vesta Mixer  Over the Counter: None  History of alcohol / drug use?: No history of alcohol / drug abuse Longest period of sobriety (when/how long): Unk  Withdrawal Symptoms: Other (Comment)  CIWA: CIWA-Ar BP: 113/69 mmHg Pulse Rate: 74 COWS:    Allergies: No Known Allergies  Home Medications:  (Not in a hospital admission)  OB/GYN Status:  No LMP recorded.  General Assessment  Data Location of Assessment: [facility] Living Arrangements: Other (Comment) (Homeless ) Can pt return to current living arrangement?: Yes Admission Status: Involuntary Is patient capable of signing voluntary admission?: No Transfer from: Acute Hospital Referral Source: MD  Education Status Is patient currently in school?: No Current Grade: None  Highest grade of school patient  has completed: None  Name of school: None  Contact person: None   Risk to self Suicidal Ideation: No-Not Currently/Within Last 6 Months Suicidal Intent: No-Not Currently/Within Last 6 Months Is patient at risk for suicide?: No Suicidal Plan?: No-Not Currently/Within Last 6 Months Access to Means: No What has been your use of drugs/alcohol within the last 12 months?: Unk  Previous Attempts/Gestures: No How many times?: 0 Other Self Harm Risks: None  Triggers for Past Attempts: Unpredictable;None known Intentional Self Injurious Behavior: None Family Suicide History: Unknown Recent stressful life event(s): Other (Comment) (Chronic MH; Homeless ) Persecutory voices/beliefs?: No Depression: No Depression Symptoms:  (None Reported ) Substance abuse history and/or treatment for substance abuse?: No Suicide prevention information given to non-admitted patients: Not applicable  Risk to Others Homicidal Ideation: No Thoughts of Harm to Others: No Current Homicidal Intent: No Current Homicidal Plan: No Access to Homicidal Means: No Identified Victim: None  History of harm to others?: No Assessment of Violence: On admission Violent Behavior Description: Upon arrival to [facility] [facility], combative with med staf  Does patient have access to weapons?: No Criminal Charges Pending?: No Does patient have a court date: No  Psychosis Hallucinations: None noted Delusions: Unspecified (Paranoid )  Mental Status Report Appear/Hygiene: Disheveled;Poor hygiene Eye Contact: Poor Motor Activity: Unremarkable Speech: Unable to assess Level of Consciousness: Sleeping Mood: Other (Comment) (UTA) Affect: Unable to Assess Anxiety Level: None Thought Processes: Flight of Ideas Judgement: Impaired Orientation: Unable to assess Obsessive Compulsive Thoughts/Behaviors: None  Cognitive Functioning Concentration: Decreased Memory: Recent Intact;Remote Intact IQ: Average Insight: Poor Impulse  Control: Poor Appetite: Fair Weight Loss: 0 Weight Gain: 0 Sleep: No Change Total Hours of Sleep:  (Unk ) Vegetative Symptoms: None  ADLScreening [facility] Assessment Services) Patient's cognitive ability adequate to safely complete daily activities?: Yes Patient able to express need for assistance with ADLs?: Yes Independently performs ADLs?: Yes (appropriate for developmental age)  Abuse/Neglect [facility]) Physical Abuse: Denies Verbal Abuse: Denies Sexual Abuse: Denies  Prior Inpatient Therapy Prior Inpatient Therapy: Yes Prior Therapy Dates: Unk  Prior Therapy Facilty/Provider(s): Unk  Reason for Treatment: Unk   Prior Outpatient Therapy Prior Outpatient Therapy: No Prior Therapy Dates: Unk  Prior Therapy Facilty/Provider(s): Unk  Reason for Treatment: Unk   ADL Screening (condition at time of admission) Patient's cognitive ability adequate to safely complete daily activities?: Yes Patient able to express need for assistance with ADLs?: Yes Independently performs ADLs?: Yes (appropriate for developmental age) Weakness of Legs: None Weakness of Arms/Hands: None  Home Assistive Devices/Equipment Home Assistive Devices/Equipment: None  Therapy Consults (therapy consults require a physician order) PT Evaluation Needed: No OT Evalulation Needed: No SLP Evaluation Needed: No Abuse/Neglect Assessment (Assessment to be complete while patient is alone) Physical Abuse: Denies Verbal Abuse: Denies Sexual Abuse: Denies Exploitation of patient/patient's resources: Denies Self-Neglect: Denies Values / Beliefs Cultural Requests During Hospitalization: None Spiritual Requests During Hospitalization: None Consults Spiritual Care Consult Needed: No Social Work Consult Needed: No Merchant navy officer (For Healthcare) Advance Directive: Patient does not have advance directive;Patient would not like information Pre-existing out of facility DNR order (yellow form or pink MOST form):  No Nutrition Screen- [facility]  Adult/[facility] Patient's home diet: Regular Have you recently lost weight without trying?: No Have you been eating poorly because of a decreased appetite?: No Malnutrition Screening Tool Score: 0  Additional Information 1:1 In Past 12 Months?: No CIRT Risk: No Elopement Risk: No Does patient have medical clearance?: Yes     Disposition:  Disposition Initial Assessment Completed: Yes Disposition of Patient: Inpatient treatment program;Referred to [facility] ) Type of inpatient treatment program: Adult Patient referred to: [facility]  On Site Evaluation by:   Reviewed with Physician:     Murrell Redden 06/28/2012 12:34 AM

## 2012-06-29 NOTE — Consult Note (Signed)
Reason for Consult: paranoid schizophrenia Referring Physician: Dr. Audry Riles Durenda Age is an 42 y.o. female.  HPI: Patient was in her room lying down and staying at the walk. Patient continued to be paranoid and to use. Patient has been given forced medication twice daily and see stated that she likes to switch to the oral medication but cannot guarantee she wouldn't take it. Patient stated she does not like taking medication. Patient aunt was contacted, who informed us patient has been chronic recurrent noncompliant does not listen anyone does not stay in one place it she has a food stamps but no [employer] or Medicaid. She has been living as a homeless for the last 2 years. Patient cannot contract for safety.  MSE: Patient has increased psychomotor activity irritability easily getting aggravated paranoid and does not trust people. She has been clinically responding to her current medications assessment and she does not have abnormal involuntary movements.  Past Medical History  Diagnosis Date  . Mental disorder     Past Surgical History  Procedure Laterality Date  . Cesarean section  9 yrs ago     History reviewed. No pertinent family history.  Social History:  reports that she has never smoked. She does not have any smokeless tobacco history on file. She reports that she does not drink alcohol or use illicit drugs.  Allergies: No Known Allergies  Medications: I have reviewed the patient's current medications.  Results for orders placed during the hospital encounter of 06/27/12 (from the past 48 hour(s))  URINALYSIS, ROUTINE W REFLEX MICROSCOPIC     Status: Abnormal   Collection Time    06/27/12  5:20 PM      Result Value Range   Color, Urine YELLOW  YELLOW   APPearance CLOUDY (*) CLEAR   Specific Gravity, Urine 1.025  1.005 - 1.030   pH 7.0  5.0 - 8.0   Glucose, UA NEGATIVE  NEGATIVE mg/dL   Hgb urine dipstick NEGATIVE  NEGATIVE   Bilirubin Urine NEGATIVE  NEGATIVE   Ketones, ur TRACE (*) NEGATIVE mg/dL   Protein, ur 30 (*) NEGATIVE mg/dL   Urobilinogen, UA 1.0  0.0 - 1.0 mg/dL   Nitrite NEGATIVE  NEGATIVE   Leukocytes, UA NEGATIVE  NEGATIVE  URINE RAPID DRUG SCREEN (HOSP PERFORMED)     Status: None   Collection Time    06/27/12  5:20 PM      Result Value Range   Opiates NONE DETECTED  NONE DETECTED   Cocaine NONE DETECTED  NONE DETECTED   Benzodiazepines NONE DETECTED  NONE DETECTED   Amphetamines NONE DETECTED  NONE DETECTED   Tetrahydrocannabinol NONE DETECTED  NONE DETECTED   Barbiturates NONE DETECTED  NONE DETECTED   Comment:            DRUG SCREEN FOR MEDICAL PURPOSES     ONLY.  IF CONFIRMATION IS NEEDED     FOR ANY PURPOSE, NOTIFY LAB     WITHIN 5 DAYS.                LOWEST DETECTABLE LIMITS     FOR URINE DRUG SCREEN     Drug Class       Cutoff (ng/mL)     Amphetamine      1000     Barbiturate      200     Benzodiazepine   200     Tricyclics       300     Opiates  300     Cocaine          300     THC              50  URINE MICROSCOPIC-ADD ON     Status: Abnormal   Collection Time    06/27/12  5:20 PM      Result Value Range   Squamous Epithelial / LPF FEW (*) RARE   WBC, UA 0-2  <3 WBC/hpf   Bacteria, UA FEW (*) RARE   Urine-Other MUCOUS PRESENT    CBC WITH DIFFERENTIAL     Status: None   Collection Time    06/27/12  7:37 PM      Result Value Range   WBC 8.0  4.0 - 10.5 K/uL   RBC 4.19  3.87 - 5.11 MIL/uL   Hemoglobin 12.5  12.0 - 15.0 g/dL   HCT 16.1  09.6 - 04.5 %   MCV 85.9  78.0 - 100.0 fL   MCH 29.8  26.0 - 34.0 pg   MCHC 34.7  30.0 - 36.0 g/dL   RDW 40.9  81.1 - 91.4 %   Platelets 314  150 - 400 K/uL   Neutrophils Relative 52  43 - 77 %   Neutro Abs 4.2  1.7 - 7.7 K/uL   Lymphocytes Relative 39  12 - 46 %   Lymphs Abs 3.1  0.7 - 4.0 K/uL   Monocytes Relative 8  3 - 12 %   Monocytes Absolute 0.6  0.1 - 1.0 K/uL   Eosinophils Relative 1  0 - 5 %   Eosinophils Absolute 0.1  0.0 - 0.7 K/uL   Basophils  Relative 0  0 - 1 %   Basophils Absolute 0.0  0.0 - 0.1 K/uL  COMPREHENSIVE METABOLIC PANEL     Status: Abnormal   Collection Time    06/27/12  7:37 PM      Result Value Range   Sodium 133 (*) 135 - 145 mEq/L   Potassium 3.7  3.5 - 5.1 mEq/L   Chloride 97  96 - 112 mEq/L   CO2 15 (*) 19 - 32 mEq/L   Glucose, Bld 80  70 - 99 mg/dL   BUN 8  6 - 23 mg/dL   Creatinine, Ser 7.82  0.50 - 1.10 mg/dL   Calcium 9.6  8.4 - 95.6 mg/dL   Total Protein 8.2  6.0 - 8.3 g/dL   Albumin 3.6  3.5 - 5.2 g/dL   AST 14  0 - 37 U/L   ALT 6  0 - 35 U/L   Alkaline Phosphatase 47  39 - 117 U/L   Total Bilirubin 0.4  0.3 - 1.2 mg/dL   GFR calc non Af Amer >90  >90 mL/min   GFR calc Af Amer >90  >90 mL/min   Comment:            The eGFR has been calculated     using the CKD EPI equation.     This calculation has not been     validated in all clinical     situations.     eGFR's persistently     <90 mL/min signify     possible Chronic Kidney Disease.  ETHANOL     Status: None   Collection Time    06/27/12  7:37 PM      Result Value Range   Alcohol, Ethyl (B) <11  0 - 11 mg/dL   Comment:  LOWEST DETECTABLE LIMIT FOR     SERUM ALCOHOL IS 11 mg/dL     FOR MEDICAL PURPOSES ONLY  BASIC METABOLIC PANEL     Status: Abnormal   Collection Time    06/28/12  9:04 AM      Result Value Range   Sodium 134 (*) 135 - 145 mEq/L   Potassium 3.9  3.5 - 5.1 mEq/L   Chloride 101  96 - 112 mEq/L   CO2 22  19 - 32 mEq/L   Glucose, Bld 98  70 - 99 mg/dL   Comment: AGE OF SPECIMEN MAY AFFECT INTEGRITY OF RESULTS   BUN 8  6 - 23 mg/dL   Creatinine, Ser 1.61  0.50 - 1.10 mg/dL   Calcium 9.9  8.4 - 09.6 mg/dL   GFR calc non Af Amer >90  >90 mL/min   GFR calc Af Amer >90  >90 mL/min   Comment:            The eGFR has been calculated     using the CKD EPI equation.     This calculation has not been     validated in all clinical     situations.     eGFR's persistently     <90 mL/min signify     possible  Chronic Kidney Disease.    No results found.  Positive for mood swings and Delusional and paranoid, isolated and withdrawn Blood pressure 113/73, pulse 71, temperature 97.7 F (36.5 C), temperature source Oral, resp. rate 16, SpO2 100.00%.   Assessment/Plan: Paranoid schizophrenia Non compliance with medication  Continue current treatment and pending [facility] wait list for hospitalization.  JONNALAGADDA,JANARDHAHA R. 06/29/2012, 4:50 PM

## 2012-06-29 NOTE — ED Provider Notes (Signed)
8:12 AM Patient sleeping on AM rounds.  Filed Vitals:   06/29/12 0538  BP: 122/72  Pulse: 71  Temp: 97.9 F (36.6 C)  Resp: 18    Per RN, there were no notable events overnight.  Patient is on wait-lists at multiple facilities for inpatient therapy.  Gerhard Munch, MD 06/29/12 [phone]

## 2012-06-29 NOTE — Clinical Social Work Note (Signed)
[name] learned that due to pt's transfer from [facility] pt is no longer on the [facility] wait list.  [name] discussed this with Dr. Jacky Kindle, Medical Director who placed a call to [facility] on our behalf.  Outcome: [name] would need to resubmit all paperwork to [facility] and have her re-reviewed to be placed on the wait list meanwhile [facility] will have her remain in her spot on the wait list.  [name] completed [facility] paperwork, faxed it to [facility] [phone]).  [name] received an authorization for [facility] from [place].  Authorization # [id number].  Dates: 03/06-03/12.  [name] called and completed phone pre-screening for [facility].  [name] will request evening [name] to f/u with [facility] in regards to having pt accepted.  Vickii Penna, LCSWA [phone]  Clinical Social Work

## 2012-06-29 NOTE — Progress Notes (Signed)
[name] confirmed with Britta Mccreedy that patient is on the waitlist with the new referral.   .Catha Gosselin, Theresia Majors  [phone] .06/29/2012 1056pm

## 2012-06-30 NOTE — Clinical Social Work Note (Signed)
Per Joy at [facility] pt remains on waitlist. Made ACT aware.  Vickii Penna, LCSWA [phone]  Clinical Social Work

## 2012-06-30 NOTE — ED Notes (Signed)
4 bags locker 36

## 2012-06-30 NOTE — ED Provider Notes (Signed)
Pt sleeping; RNs report no concerns overnight.   John M Bednar, MD 07/01/12 1344 

## 2012-06-30 NOTE — ED Notes (Signed)
Has had an uneventful day today, does not want anyone in her room, talking to her or doing VS etc. Asks that we "leave her room". If the doc wants something done he/she will ask patient about having it done and she will handle it from there.

## 2012-07-01 NOTE — ED Notes (Signed)
Pt refused geodon. MD notified. Pt noted to have refused med in the past. Security called for assistance. Pt decided to take med after security presented to pt's room. Med given after a lot of encouragement. Vw, rn.

## 2012-07-01 NOTE — ED Notes (Signed)
Received report on pt. Pt standing up in room changing channel on the television. Denies any needs at this time. Asked for a piece of paper and pencil. Pt given paper and crayons. Remains in room at this time.

## 2012-07-01 NOTE — ED Provider Notes (Signed)
BP 135/73  Pulse 61  Temp(Src) 97.8 F (36.6 C) (Oral)  Resp 18  SpO2 100% Resting currently. IVC in place. No concerns overnight. Pt on [facility] wait list   Loren Racer, MD 07/01/12 [phone]

## 2012-07-02 MED ORDER — ZIPRASIDONE HCL 20 MG PO CAPS
20.0000 mg | ORAL_CAPSULE | Freq: Two times a day (BID) | ORAL | Status: DC
  Administered 2012-07-03 – 2012-07-04 (×4): 20 mg via ORAL
  Filled 2012-07-02 (×5): qty 1

## 2012-07-02 MED ORDER — RISPERIDONE 2 MG PO TABS
3.0000 mg | ORAL_TABLET | Freq: Every day | ORAL | Status: DC
  Administered 2012-07-02 – 2012-07-03 (×2): 3 mg via ORAL
  Filled 2012-07-02 (×2): qty 1

## 2012-07-02 MED ORDER — BENZTROPINE MESYLATE 1 MG/ML IJ SOLN
1.0000 mg | Freq: Two times a day (BID) | INTRAMUSCULAR | Status: DC
  Administered 2012-07-02 (×2): 1 mg via INTRAMUSCULAR
  Filled 2012-07-02 (×2): qty 2

## 2012-07-02 NOTE — ED Notes (Signed)
Per Dr.Campos please hold Geodon until telepsych consult completed.

## 2012-07-02 NOTE — ED Provider Notes (Signed)
8:35 AM Pt only receiving scheduled IM geodon. Used to be on risperdone at night for psychosis. Noncompliant with medications. Will ask telepsych to see the pt for medication recommendations  Filed Vitals:   07/02/12 0024  BP: 144/76  Pulse: 73  Temp: 98.2 F (36.8 C)  Resp: 18   No complaints this AM  Lyanne Co, MD 07/02/12 [phone]

## 2012-07-02 NOTE — ED Notes (Signed)
Pt refused to let me take her vital signs.

## 2012-07-02 NOTE — ED Notes (Signed)
[facility] attempted to collect pt's vital signs. Pt refused to have vital signs collected.

## 2012-07-02 NOTE — ED Notes (Signed)
telepsych info faxed and called 

## 2012-07-02 NOTE — ED Provider Notes (Signed)
Seen by telepsych. Recommend invega sustenna 234 mg IM monthly. congentin 1 mg PO BID  Lyanne Co, MD 07/02/12 [phone]

## 2012-07-03 MED ORDER — BENZTROPINE MESYLATE 1 MG/ML IJ SOLN: 1.0000 mg | Freq: Two times a day (BID) | INTRAMUSCULAR | Status: DC | PRN

## 2012-07-03 MED ORDER — BENZTROPINE MESYLATE 1 MG PO TABS
1.0000 mg | ORAL_TABLET | Freq: Two times a day (BID) | ORAL | Status: DC | PRN
  Administered 2012-07-03 – 2012-07-04 (×2): 1 mg via ORAL
  Filled 2012-07-03 (×2): qty 1

## 2012-07-03 NOTE — BHH Counselor (Signed)
Pt continues to refuse to speak with [facility] counselors.   Evette Cristal, [place] Assessment Counselor

## 2012-07-03 NOTE — BHH Counselor (Signed)
Jillyn Hidden at [facility] confirmed pt still on their wait list.  Evette Cristal, [facility] Assessment Counselor

## 2012-07-03 NOTE — Progress Notes (Signed)
[name] referred patient to [facility] and confirmed, patient under review.   Catha Gosselin, LCSWA  [phone] .07/03/2012 1147pm

## 2012-07-03 NOTE — ED Provider Notes (Signed)
Patient sleeping at time of evaluation. Currently under involuntary commitment, telepsych recommends inpatient psychiatric treatment. No reported problems overnight. Awaiting placement. [facility]  Gilda Crease, MD 07/03/12 [phone]

## 2012-07-03 NOTE — Clinical Social Work Note (Addendum)
11:02am- [name] called to confirm receipt of faxed IVC paperwork.  Confirmed per Dan Humphreys.  [name] made note of this in the IVC binder.  10:28am- [name] faxed IVC paperwork and copies to Magistrate.  IVC paperwork dated 06/26/2012 expires today.  [name] completed IVC paperwork and 1st Findings.    Vickii Penna, LCSWA [phone]  Clinical Social Work

## 2012-07-03 NOTE — BHH Counselor (Signed)
3/10 No beds at [facility], [place], [place], [place], [place], [place],  [address], [place], [place], [place] not taking referrals until 14th,   3/10 [facility] and left a voice mail  requesting bed availability.  Pt referred to [facility], [facility] and [place] side [place]; pending review

## 2012-07-04 MED ORDER — ZIPRASIDONE HCL 20 MG PO CAPS: 20.0000 mg | ORAL_CAPSULE | Freq: Two times a day (BID) | ORAL | Status: DC

## 2012-07-04 MED ORDER — BENZTROPINE MESYLATE 1 MG/ML IJ SOLN: 1.0000 mg | Freq: Two times a day (BID) | INTRAMUSCULAR | Status: DC

## 2012-07-04 MED ORDER — RISPERIDONE 3 MG PO TABS: 3.0000 mg | ORAL_TABLET | Freq: Every day | ORAL | Status: DC

## 2012-07-04 MED ORDER — BENZTROPINE MESYLATE 1 MG PO TABS: 1.0000 mg | ORAL_TABLET | Freq: Two times a day (BID) | ORAL | Status: DC

## 2012-07-04 NOTE — ED Notes (Signed)
Patient refused her 1200 hr vital signs on 07/04/12

## 2012-07-04 NOTE — Clinical Social Work Note (Signed)
[name] was consulted by psychiatrist to assist (if needed) with bus pass.  [name] gave bus pass to RN who will assisting with possible d/c.  Vickii Penna, LCSWA [phone]  Clinical Social Work

## 2012-07-04 NOTE — Progress Notes (Signed)
ED CM spoke with pt and inquired of invega sustenna 234 mg IM monthly doses She states she does not recall taking medication.   Pt began to scream she wanted the dr to discharge her and did not want anymore of his medications even after Cm informed her staff was not trying to give her the medication. Pt walked out of the room, around CM fussing and screaming she was going to call "that boy to see when he is coming to pick me up" Dr Elsie Saas updated.

## 2012-07-04 NOTE — BHH Counselor (Signed)
3/11 Pt referred [facility], [facility], [address] [place] and [place] side [place]; pending review.  3/11 No beds at [place], [place], [place], [place], [place], [place],  [address], [place], [place], [place] not taking referrals until 14th.

## 2012-07-04 NOTE — ED Provider Notes (Signed)
BP 142/83  Pulse 71  Temp(Src) 97.8 F (36.6 C) (Oral)  Resp 16  SpO2 99% No new nursing issues overnight. Resting comfortably. Pending [facility] placement  Loren Racer, MD 07/04/12 [phone]

## 2012-07-04 NOTE — Consult Note (Signed)
Reason for Consult: Paranoid schizophrenia Referring Physician: Dr. Ranae Palms   42 y.o. female.  HPI: Patient was seen staying in her bed and requested to be discharged from the hospital. Patient has been compliant with her intramuscular medications and linear on no oral medications. Saphenous reported patient has been taking her medication and has no reported adverse effects. Patient stated she has been feeling fine she does not endorse symptoms of paranoid delusions, auditory or visual hallucinations. Patient does not want depot antipsychotic medications which was suggested by telepsychiatry during the weekend. Patient has no suicidal or homicidal ideation intents or plans. Patient stated she can't go on living with her brother temporally and has a plan of going to [place]. She has outpatient psychiatric services at [facility].  MSE: Patient was calm and cooperative. She has fine mood and appropriate affect. She has normal speech and thought process but has poor eye contact. She has denied paranoid delusion, Auditory and visual hallucinations.   Past Medical History  Diagnosis Date  . Mental disorder     Past Surgical History  Procedure Laterality Date  . Cesarean section  9 yrs ago     History reviewed. No pertinent family history.  Social History:  reports that she has never smoked. She does not have any smokeless tobacco history on file. She reports that she does not drink alcohol or use illicit drugs.  Allergies: No Known Allergies  Medications: I have reviewed the patient's current medications.  No results found for this or any previous visit (from the past 48 hour(s)).  No results found.  Positive for paranoid delusions and non compliance with medication Blood pressure 142/83, pulse 71, temperature 97.8 F (36.6 C), temperature source Oral, resp. rate 16, SpO2 99.00%.   Assessment/Plan: Chronic paranoid schizophrenia History of non compliance with  medications  Recommendation: Discharge to psychiatric out patient care at [facility] behavioral health and will provide medication script at discharge. Patient stated she will go to her brother home.      JONNALAGADDA,JANARDHAHA R. 07/04/2012, 2:08 PM

## 2012-07-04 NOTE — BHH Counselor (Signed)
Patient continues to remain on the [facility] wait list since 3/6. As of 07/04/12@ 0910am Vickie confirmed pt's waitlist status; awaiting avail bed.

## 2012-07-04 NOTE — BHH Suicide Risk Assessment (Signed)
Suicide Risk Assessment  Discharge Assessment     Demographic Factors:  Adolescent or young adult and Low socioeconomic status  Mental Status Per Nursing Assessment::   On Admission:     Current Mental Status by Physician: NA  Loss Factors: Financial problems/change in socioeconomic status  Historical Factors: Impulsivity  Risk Reduction Factors:   Sense of responsibility to family, Religious beliefs about death and Living with another person, especially a relative  Continued Clinical Symptoms:  Schizophrenia:   Paranoid or undifferentiated type Previous Psychiatric Diagnoses and Treatments  Cognitive Features That Contribute To Risk:  Closed-mindedness    Suicide Risk:  Minimal: No identifiable suicidal ideation.  Patients presenting with no risk factors but with morbid ruminations; may be classified as minimal risk based on the severity of the depressive symptoms  Discharge Diagnoses:   AXIS I:  Chronic paranoid schizophrenia noncompliant with medications AXIS II:  Deferred AXIS III:   Past Medical History  Diagnosis Date  . Mental disorder    AXIS IV:  economic problems, housing problems, occupational problems, other psychosocial or environmental problems and problems related to social environment AXIS V:  41-50 serious symptoms  Plan Of Care/Follow-up recommendations:  Activity:  As tolerated Diet:  Regular  Is patient on multiple antipsychotic therapies at discharge:  No   Has Patient had three or more failed trials of antipsychotic monotherapy by history:  No  Recommended Plan for Multiple Antipsychotic Therapies: Not applicable  Jakyla Reza,JANARDHAHA R. 07/04/2012, 2:36 PM

## 2012-08-25 ENCOUNTER — Encounter (HOSPITAL_COMMUNITY): Payer: Self-pay

## 2012-08-25 ENCOUNTER — Emergency Department (HOSPITAL_COMMUNITY)
Admission: EM | Admit: 2012-08-25 | Discharge: 2012-08-25 | Disposition: A | Discharge status: Home / Self Care | Payer: Self-pay | Attending: Emergency Medicine | Admitting: Emergency Medicine

## 2012-08-25 DIAGNOSIS — F489 Nonpsychotic mental disorder, unspecified: Secondary | ICD-10-CM | POA: Insufficient documentation

## 2012-08-25 DIAGNOSIS — F1910 Other psychoactive substance abuse, uncomplicated: Secondary | ICD-10-CM

## 2012-08-25 DIAGNOSIS — F259 Schizoaffective disorder, unspecified: Secondary | ICD-10-CM

## 2012-08-25 DIAGNOSIS — Z79899 Other long term (current) drug therapy: Secondary | ICD-10-CM | POA: Insufficient documentation

## 2012-08-25 DIAGNOSIS — F1994 Other psychoactive substance use, unspecified with psychoactive substance-induced mood disorder: Secondary | ICD-10-CM

## 2012-08-25 DIAGNOSIS — F1410 Cocaine abuse, uncomplicated: Secondary | ICD-10-CM

## 2012-08-25 DIAGNOSIS — Z0289 Encounter for other administrative examinations: Secondary | ICD-10-CM | POA: Insufficient documentation

## 2012-08-25 DIAGNOSIS — F22 Delusional disorders: Secondary | ICD-10-CM | POA: Insufficient documentation

## 2012-08-25 LAB — CBC
MCHC: 34.1 g/dL (ref 30.0–36.0)
MCV: 85.3 fL (ref 78.0–100.0)
Platelets: 294 10*3/uL (ref 150–400)
RDW: 13.2 % (ref 11.5–15.5)
WBC: 5.6 10*3/uL (ref 4.0–10.5)

## 2012-08-25 LAB — COMPREHENSIVE METABOLIC PANEL
AST: 15 U/L (ref 0–37)
Albumin: 3 g/dL — ABNORMAL LOW (ref 3.5–5.2)
Chloride: 104 mEq/L (ref 96–112)
Creatinine, Ser: 0.6 mg/dL (ref 0.50–1.10)
Potassium: 3.9 mEq/L (ref 3.5–5.1)
Total Bilirubin: 0.4 mg/dL (ref 0.3–1.2)
Total Protein: 6.8 g/dL (ref 6.0–8.3)

## 2012-08-25 LAB — RAPID URINE DRUG SCREEN, HOSP PERFORMED
Amphetamines: NOT DETECTED
Benzodiazepines: NOT DETECTED
Opiates: NOT DETECTED
Tetrahydrocannabinol: NOT DETECTED

## 2012-08-25 LAB — POCT PREGNANCY, URINE: Preg Test, Ur: POSITIVE — AB

## 2012-08-25 MED ORDER — RISPERIDONE 2 MG PO TABS: 3.0000 mg | ORAL_TABLET | Freq: Every day | ORAL | Status: DC

## 2012-08-25 MED ORDER — ZIPRASIDONE HCL 20 MG PO CAPS
20.0000 mg | ORAL_CAPSULE | Freq: Two times a day (BID) | ORAL | Status: DC
  Administered 2012-08-25: 20 mg via ORAL
  Filled 2012-08-25: qty 1

## 2012-08-25 MED ORDER — BENZTROPINE MESYLATE 1 MG PO TABS
1.0000 mg | ORAL_TABLET | Freq: Two times a day (BID) | ORAL | Status: DC
  Administered 2012-08-25: 1 mg via ORAL
  Filled 2012-08-25: qty 1

## 2012-08-25 MED ORDER — ZIPRASIDONE MESYLATE 20 MG IM SOLR: 10.0000 mg | Freq: Four times a day (QID) | INTRAMUSCULAR | Status: DC | PRN

## 2012-08-25 NOTE — ED Notes (Signed)
Pt changed into blue scrubs.  Pt wanded by security.  Pt belongings placed behind nurses station.  Noted three pt belongings bags, one ross bag, one black bag, one blue bag, one black blanket.

## 2012-08-25 NOTE — Progress Notes (Signed)
CM spoke with pt who confirms self pay [place] resident with no pcp. CM discussed written information for self pay pcps, importance of pcp for f/u care, www.needymeds.org, discounted pharmacies and other [place] resources such as financial assistance, DSS and  health department Reviewed Health connect number to assist with finding self pay provider close to pt's residence. Reviewed resources for [place] self pay pcps like [facility], [facility] [address], [facility], general medical clinics, [facility] plus others, [facility] out patient [facility]ies, housing and other resources in [place]. Pt voiced understanding and appreciation of resources provided  Pt agreed to receive these written resources upon on the day of d/c along with d/c instructions. BH RN informed and CM requested pt be given written information on the day of d/c   

## 2012-08-25 NOTE — BH Assessment (Signed)
Assessment Note   Holly Hines is an 42 y.o. female IVC'd by her brother, saying that she was found lying on the steps trying to hurt herself and she's out of touch with reality. She's very aggressive towards authority figures. Family states that she abuses crack cocaine and is a danger to herself and others. Patient was taken to [facility] and then transferred to [facility] for placement.   Writer met with patient and she was not forthcoming with any questioning. She also did not want to disclose any history. Patient did confirm that she is not suicidal, homicidal, or experiencing any AVH's.  Patient denies alcohol and drug use, however; UDS is positive for cocaine.  Previous notes from assessment completed 3/5/201: Holly Hines is a 42 y.o. female who presents via IVC from [place]. Per Vesta Mixer, pt has been in their facility for 1 week awaiting a bed with [facility] and receiving treatment with no improvement. Pt is refusing all medications and interventions provided by [facility]. Due to pt.'s uncooperative behavior with Vesta Mixer, she has been sent to emerg dept continue waiting for a bed with [facility]. Pt will be provided medications and any other necessary medical care. The following information is collateral as pt will not willingly communicate with staff personnel.  Upon arrival to [facility], pt was unwilling will medical staff, refusing medical clearance treatment and assessments from medical team. It is noted by EMT, that after several minutes of attempting to provide medical care, physical restraints applied by staff. Pt became combative and scratching at staff and swinging fists at medical staff. Pt was agitated, guarded, labile and paranoid.  Pt IVC'd by brother and presented [facility] with paranoia, stating that the FBI was after her because she refused to have sex with them. States the government place an apparatus inside her body and people are watching her through phone lines. Pt reported she  felt as though numerous people were after her. Pt also expressed thoughts of wanting to harm other, no specific plan. Pt throwing food and would not leave room while at [facility]. Pt is homeless and [place]. Pt is non-compliant with medications but has been prescribed Abilify(5mg ) while at [facility], however unknown what additional medications pt is taking. Pt denies SI/SA, but has past hx of alcohol dependence and per petition expressed thoughts of hurt self, no specific plan.  A telepsych was attempted by Dr. Jacky Kindle, however pt refused to talk with telepsychiatrist. Pt will remained in psych ed until bed becomes avail with [facility] and will be reviewed daily for possible improvement by psych staff to determine if continued inpt treatment is needed. *Patient was not admitted to [facility] and was discharged home by the psychiatrist recommendations 2-3 days later.  More than likely patient did not follow up due to history of non compliance.   Patient was evaluated by Dr. Elsie Saas today and discharge home was recommended. He did not feel that patient met any criteria for a psychiatric admission.     Axis I: Chronic Paranoid Schizophrenia Axis II: Deferred Axis III:  Past Medical History  Diagnosis Date  . Mental disorder    Axis IV: economic problems, housing problems, other psychosocial or environmental problems, problems related to social environment, problems with access to health care services and problems with primary support group Axis V: 41-50 serious symptoms  Past Medical History:  Past Medical History  Diagnosis Date  . Mental disorder     Past Surgical History  Procedure Laterality  Date  . Cesarean section  9 yrs ago     Family History: History reviewed. No pertinent family history.  Social History:  reports that she has never smoked. She does not have any smokeless tobacco history on file. She reports that she does not drink alcohol or use illicit  drugs.  Additional Social History:  Alcohol / Drug Use Pain Medications: SEE MAR Prescriptions: SEE MAR Over the Counter: SEE MAR History of alcohol / drug use?: Yes Substance #1 Name of Substance 1: UDS positive for cocaine 1 - Age of First Use: patient refused  1 - Amount (size/oz): patient refused 1 - Frequency: patient refused 1 - Duration: patient refused 1 - Last Use / Amount: patient refused  CIWA: CIWA-Ar BP: 146/99 mmHg Pulse Rate: 81 COWS:    Allergies: No Known Allergies  Home Medications:  (Not in a hospital admission)  OB/GYN Status:  No LMP recorded.  General Assessment Data Location of Assessment: [facility] Living Arrangements: Other (Comment) (Homeless) Can pt return to current living arrangement?: Yes Admission Status: Involuntary Is patient capable of signing voluntary admission?: Yes Transfer from: Acute Hospital Referral Source: Self/Family/Friend     Risk to self Suicidal Ideation: No-Not Currently/Within Last 6 Months Suicidal Intent: No-Not Currently/Within Last 6 Months Is patient at risk for suicide?: No Suicidal Plan?: No Access to Means: No What has been your use of drugs/alcohol within the last 12 months?:  (n/a) Previous Attempts/Gestures: No How many times?:  (n/a) Other Self Harm Risks:  (n/a) Triggers for Past Attempts: Other (Comment) (patient denies previous suicidal attempts and/or gestures) Intentional Self Injurious Behavior: None Family Suicide History: Unknown (chronic illness and homelessness) Recent stressful life event(s): Other (Comment) (patient ) Persecutory voices/beliefs?: No Depression: Yes Depression Symptoms: Feeling angry/irritable (patient ) Substance abuse history and/or treatment for substance abuse?: No Suicide prevention information given to non-admitted patients: Not applicable  Risk to Others Homicidal Ideation: No Thoughts of Harm to Others: No Current Homicidal Intent: No Current Homicidal Plan:  No Access to Homicidal Means: No Identified Victim:  (n/a) History of harm to others?: No Assessment of Violence: None Noted Violent Behavior Description:  (patient calm, however; uncooperative w/ answering questions) Does patient have access to weapons?: No Criminal Charges Pending?: No Does patient have a court date: No  Psychosis Hallucinations: None noted Delusions: None noted (patient denies, however; appears paranoid)  Mental Status Report Appear/Hygiene: Disheveled;Poor hygiene Eye Contact: Poor Motor Activity: Agitation;Freedom of movement Speech: Logical/coherent Level of Consciousness: Sleeping;Alert;Other (Comment) (patient alert, howerver; sts she wants to sleep and not talk) Mood: Irritable Affect: Irritable;Apprehensive Anxiety Level: None Thought Processes: Coherent Judgement: Impaired Orientation: Person;Place;Time;Situation Obsessive Compulsive Thoughts/Behaviors: None  Cognitive Functioning Concentration: Decreased Memory: Recent Intact;Remote Intact IQ: Average Insight: Fair Impulse Control: Fair Appetite: Fair Weight Loss:  (none reported) Weight Gain:  (none reported) Sleep: Decreased Total Hours of Sleep:  (unk) Vegetative Symptoms: None  ADLScreening [facility] Assessment Services) Patient's cognitive ability adequate to safely complete daily activities?: Yes Patient able to express need for assistance with ADLs?: Yes Independently performs ADLs?: Yes (appropriate for developmental age)  Abuse/Neglect [facility]) Physical Abuse: Denies Verbal Abuse: Denies Sexual Abuse: Denies  Prior Inpatient Therapy Prior Inpatient Therapy: Yes Prior Therapy Dates: Unk  Prior Therapy Facilty/Provider(s): Unk  Reason for Treatment: Unk   Prior Outpatient Therapy Prior Outpatient Therapy: No Prior Therapy Dates: Unk  Prior Therapy Facilty/Provider(s): Unk  Reason for Treatment: Unk   ADL Screening (condition at time of admission) Patient's cognitive ability  adequate to safely complete daily activities?: Yes Patient able to express need for assistance with ADLs?: Yes Independently performs ADLs?: Yes (appropriate for developmental age) Weakness of Legs: None Weakness of Arms/Hands: None       Abuse/Neglect Assessment (Assessment to be complete while patient is alone) Physical Abuse: Denies Verbal Abuse: Denies Sexual Abuse: Denies Exploitation of patient/patient's resources: Denies Self-Neglect: Denies Values / Beliefs Cultural Requests During Hospitalization: None Spiritual Requests During Hospitalization: None Consults Spiritual Care Consult Needed: No Social Work Consult Needed: No Merchant navy officer (For Healthcare) Advance Directive: Patient does not have advance directive Nutrition Screen- [facility] Patient's home diet: Regular  Additional Information 1:1 In Past 12 Months?: No CIRT Risk: No Elopement Risk: No Does patient have medical clearance?: Yes     Disposition:  Disposition Initial Assessment Completed for this Encounter: Yes Disposition of Patient: Outpatient treatment;Referred to [facility]) Type of outpatient treatment: Adult Patient referred to: [facility];Other (Comment) ([facility] [place])  On Site Evaluation by:   Reviewed with Physician:     Melynda Ripple Mona 08/25/2012 12:00 PM

## 2012-08-25 NOTE — BHH Counselor (Signed)
Patient to be discharged home, per recommendations of Dr. Elsie Saas. Writer met with patient to discuss following up options. Patient sts, "I don't need to follow up and I don't care what that doctor recommended". Writer tried to encourage patient to follow up with a community psychiatrist, therapist, and also a substance abuse program. Patient turned her head and did not seem interested in the conversation. Patient stated, "I told you I don't need no therapist or psychiatrist telling me what I need to do". She also stated that she does not have a substance abuse issues and would not be following with substance abuse related treatment. Writer asked patient if she would at least agree to take the hand out of referrals available. Patient agreed and writer left the paper referrals listing community providers that treat mental illness and substance abuse issues on patients bedside table.

## 2012-08-25 NOTE — ED Notes (Signed)
Pt IVC'd by her brother, saying that she was found lying on the steps trying to hurt herself and she's out of touch with reality. She's very aggressive towards authority figures. Family states that she abuses crack cocaine and is a danger to herself and others.

## 2012-08-25 NOTE — ED Notes (Signed)
Pt transferred from triage, IVC pt, homeless. Presents for medical clearance.  IVC papers report pt laying outside on steps of a building, believes that there government has put something inside her body, and can see her through the television.  Pt aggressive towards family members.  Pt not forthcoming with information.  Talking to herself at present.

## 2012-08-25 NOTE — ED Provider Notes (Signed)
History     CSN: [id number]  Arrival date & time 08/25/12  4098   First MD Initiated Contact with Patient 08/25/12 0406      Chief Complaint  Patient presents with  . Medical Clearance    (Consider location/radiation/quality/duration/timing/severity/associated sxs/prior treatment) HPI The patient presents under involuntary commitment papers taken out by her family members due to concern for her safety. Pain denies any complaints. She does state that she is worried about a possible device or something placed in her stomach during a previous abdominal surgery. She denies alcohol or drug use. She denies particular physical pain. Per report the patient has been exhibiting inconsistent behavior, acting delusional he, and worsening hallucinations. The patient also is possibly using multiple illicit substances.   Past Medical History  Diagnosis Date  . Mental disorder     Past Surgical History  Procedure Laterality Date  . Cesarean section  9 yrs ago     History reviewed. No pertinent family history.  History  Substance Use Topics  . Smoking status: Never Smoker   . Smokeless tobacco: Not on file  . Alcohol Use: No    OB History   Grav Para Term Preterm Abortions TAB SAB Ect Mult Living                  Review of Systems  Unable to perform ROS: Psychiatric disorder    Allergies  Review of patient's allergies indicates no known allergies.  Home Medications   Current Outpatient Rx  Name  Route  Sig  Dispense  Refill  . benztropine (COGENTIN) 1 MG tablet   Oral   Take 1 tablet (1 mg total) by mouth 2 (two) times daily.   30 tablet   0   . risperiDONE (RISPERDAL) 3 MG tablet   Oral   Take 1 tablet (3 mg total) by mouth at bedtime.   15 tablet   0   . ziprasidone (GEODON) 20 MG capsule   Oral   Take 1 capsule (20 mg total) by mouth 2 (two) times daily with a meal.   30 capsule   0     BP 146/99  Pulse 81  Temp(Src) 98.6 F (37 C) (Oral)  Resp 16   SpO2 99%  Physical Exam  Nursing note and vitals reviewed. Constitutional: She is oriented to person, place, and time. She appears well-developed and well-nourished. No distress.  HENT:  Head: Normocephalic and atraumatic.  Eyes: Conjunctivae and EOM are normal.  Cardiovascular: Normal rate and regular rhythm.   Pulmonary/Chest: Effort normal and breath sounds normal. No stridor. No respiratory distress.  Abdominal: She exhibits no distension.  Musculoskeletal: She exhibits no edema.  Neurological: She is alert and oriented to person, place, and time. No cranial nerve deficit.  Skin: Skin is warm and dry.  Psychiatric: Her speech is normal and behavior is normal. Her mood appears anxious. Thought content is delusional. Cognition and memory are impaired.    ED Course  Procedures (including critical care time)  Labs Reviewed  CBC - Abnormal; Notable for the following:    RBC 3.54 (*)    Hemoglobin 10.3 (*)    HCT 30.2 (*)    All other components within normal limits  COMPREHENSIVE METABOLIC PANEL - Abnormal; Notable for the following:    BUN 5 (*)    Albumin 3.0 (*)    All other components within normal limits  ETHANOL  URINE RAPID DRUG SCREEN (HOSP PERFORMED)   No results found.  No diagnosis found.    MDM  This patient presents with familial concerns for her safety do to increasingly inconsistent behavior, delusional activity.  On my exam the patient is cooperative, though delusional.  The patient is medically clear for further psychiatric evaluation.        Gerhard Munch, MD 08/25/12 [phone]

## 2012-08-25 NOTE — ED Notes (Signed)
Called and spoke with the on call physician for the ED and reported that Pt is pregnant. He informed us that the nursing staff could give her the results of the test.

## 2012-08-25 NOTE — Consult Note (Signed)
Reason for Consult: Cocaine intoxication Referring Physician: Dr. Skeet Simmer Durenda Age is an 42 y.o. female.  HPI: patient was seen and chart reviewed. Patient denied current symptoms of depression anxiety psychosis. Patient denied suicidal homicidal ideation. Patient has been noncompliant with her outpatient psychiatric services and treatment index finger is positive for cocaine. The patient was known from her previous emergency department visits. Her urine drug screen was positive for cocaine.  Mental Status Examination: Patient appeared as per his stated age, and fairly groomed, and maintaining good eye contact. Patient has fine mood and his affect was constricted. He has normal rate, rhythm, and volume of speech. His thought process is linear and goal directed. Patient has denied suicidal, homicidal ideations, intentions or plans. Patient has no evidence of auditory or visual hallucinations, delusions, and paranoia. Patient has fair insight judgment and impulse control.  Past Medical History  Diagnosis Date  . Mental disorder     Past Surgical History  Procedure Laterality Date  . Cesarean section  9 yrs ago     History reviewed. No pertinent family history.  Social History:  reports that she has never smoked. She does not have any smokeless tobacco history on file. She reports that she does not drink alcohol or use illicit drugs.  Allergies: No Known Allergies  Medications: I have reviewed the patient's current medications.  Results for orders placed during the hospital encounter of 08/25/12 (from the past 48 hour(s))  CBC     Status: Abnormal   Collection Time    08/25/12  3:25 AM      Result Value Range   WBC 5.6  4.0 - 10.5 K/uL   RBC 3.54 (*) 3.87 - 5.11 MIL/uL   Hemoglobin 10.3 (*) 12.0 - 15.0 g/dL   HCT 16.1 (*) 09.6 - 04.5 %   MCV 85.3  78.0 - 100.0 fL   MCH 29.1  26.0 - 34.0 pg   MCHC 34.1  30.0 - 36.0 g/dL   RDW 40.9  81.1 - 91.4 %   Platelets 294  150 - 400  K/uL  COMPREHENSIVE METABOLIC PANEL     Status: Abnormal   Collection Time    08/25/12  3:25 AM      Result Value Range   Sodium 139  135 - 145 mEq/L   Potassium 3.9  3.5 - 5.1 mEq/L   Chloride 104  96 - 112 mEq/L   CO2 25  19 - 32 mEq/L   Glucose, Bld 91  70 - 99 mg/dL   BUN 5 (*) 6 - 23 mg/dL   Creatinine, Ser 7.82  0.50 - 1.10 mg/dL   Calcium 8.9  8.4 - 95.6 mg/dL   Total Protein 6.8  6.0 - 8.3 g/dL   Albumin 3.0 (*) 3.5 - 5.2 g/dL   AST 15  0 - 37 U/L   ALT 9  0 - 35 U/L   Alkaline Phosphatase 65  39 - 117 U/L   Total Bilirubin 0.4  0.3 - 1.2 mg/dL   GFR calc non Af Amer >90  >90 mL/min   GFR calc Af Amer >90  >90 mL/min   Comment:            The eGFR has been calculated     using the CKD EPI equation.     This calculation has not been     validated in all clinical     situations.     eGFR's persistently     <90  mL/min signify     possible Chronic Kidney Disease.  ETHANOL     Status: None   Collection Time    08/25/12  3:25 AM      Result Value Range   Alcohol, Ethyl (B) <11  0 - 11 mg/dL   Comment:            LOWEST DETECTABLE LIMIT FOR     SERUM ALCOHOL IS 11 mg/dL     FOR MEDICAL PURPOSES ONLY  URINE RAPID DRUG SCREEN (HOSP PERFORMED)     Status: Abnormal   Collection Time    08/25/12  4:36 AM      Result Value Range   Opiates NONE DETECTED  NONE DETECTED   Cocaine POSITIVE (*) NONE DETECTED   Benzodiazepines NONE DETECTED  NONE DETECTED   Amphetamines NONE DETECTED  NONE DETECTED   Tetrahydrocannabinol NONE DETECTED  NONE DETECTED   Barbiturates NONE DETECTED  NONE DETECTED   Comment:            DRUG SCREEN FOR MEDICAL PURPOSES     ONLY.  IF CONFIRMATION IS NEEDED     FOR ANY PURPOSE, NOTIFY LAB     WITHIN 5 DAYS.                LOWEST DETECTABLE LIMITS     FOR URINE DRUG SCREEN     Drug Class       Cutoff (ng/mL)     Amphetamine      1000     Barbiturate      200     Benzodiazepine   200     Tricyclics       300     Opiates          300      Cocaine          300     THC              50  POCT PREGNANCY, URINE     Status: Abnormal   Collection Time    08/25/12  4:42 AM      Result Value Range   Preg Test, Ur POSITIVE (*) NEGATIVE   Comment:            THE SENSITIVITY OF THIS     METHODOLOGY IS >24 mIU/mL    No results found.  Positive for illegal drug usage and non compliance in the in Blood pressure 146/99, pulse 81, temperature 98.6 F (37 C), temperature source Oral, resp. rate 16, SpO2 99.00%.   Assessment/Plan: Cocaine intoxication Noncompliant with medication schizophrenia by history  Recommendation: Recommended out patient psychiatric treatment and rescind IVC as she has contract for safety.   Buford Gayler,JANARDHAHA R. 08/25/2012, 11:33 AM

## 2012-08-25 NOTE — [facility] Suicide Risk Assessment (Signed)
Suicide Risk Assessment  Discharge Assessment     Demographic Factors:  Adolescent or young adult and Low socioeconomic status  Mental Status Per Nursing Assessment::   On Admission:     Current Mental Status by Physician: NA  Loss Factors: Financial problems/change in socioeconomic status  Historical Factors: Impulsivity  Risk Reduction Factors:   Sense of responsibility to family, Religious beliefs about death, Positive social support and Positive therapeutic relationship  Continued Clinical Symptoms:  Alcohol/Substance Abuse/Dependencies Schizophrenia:   Paranoid or undifferentiated type  Cognitive Features That Contribute To Risk:  Polarized thinking    Suicide Risk:  Minimal: No identifiable suicidal ideation.  Patients presenting with no risk factors but with morbid ruminations; may be classified as minimal risk based on the severity of the depressive symptoms  Discharge Diagnoses:   AXIS I:  Schizoaffective Disorder, Substance Abuse and Substance Induced Mood Disorder AXIS II:  Deferred AXIS III:   Past Medical History  Diagnosis Date  . Mental disorder    AXIS IV:  economic problems, educational problems, occupational problems, other psychosocial or environmental problems and problems related to social environment AXIS V:  41-50 serious symptoms  Plan Of Care/Follow-up recommendations:  Activity:  As tolerated Diet:  Regular  Is patient on multiple antipsychotic therapies at discharge:  No   Has Patient had three or more failed trials of antipsychotic monotherapy by history:  No  Recommended Plan for Multiple Antipsychotic Therapies: Not applicable  Stefhanie Kachmar,JANARDHAHA R. 08/25/2012, 11:42 AM

## 2012-10-20 ENCOUNTER — Emergency Department (HOSPITAL_COMMUNITY): Payer: Self-pay

## 2012-10-20 ENCOUNTER — Encounter (HOSPITAL_COMMUNITY): Payer: Self-pay | Admitting: Emergency Medicine

## 2012-10-20 ENCOUNTER — Inpatient Hospital Stay (HOSPITAL_COMMUNITY)
Admission: AD | Admit: 2012-10-20 | Discharge: 2012-10-25 | DRG: 781 | Disposition: A | Discharge status: Other Acute Inpatient Hospital | Payer: MEDICAID | Attending: Obstetrics and Gynecology | Admitting: Obstetrics and Gynecology

## 2012-10-20 DIAGNOSIS — F29 Unspecified psychosis not due to a substance or known physiological condition: Secondary | ICD-10-CM

## 2012-10-20 DIAGNOSIS — Z5900 Homelessness unspecified: Secondary | ICD-10-CM

## 2012-10-20 DIAGNOSIS — F200 Paranoid schizophrenia: Secondary | ICD-10-CM

## 2012-10-20 DIAGNOSIS — O10019 Pre-existing essential hypertension complicating pregnancy, unspecified trimester: Secondary | ICD-10-CM | POA: Diagnosis present

## 2012-10-20 DIAGNOSIS — O163 Unspecified maternal hypertension, third trimester: Secondary | ICD-10-CM

## 2012-10-20 DIAGNOSIS — O99340 Other mental disorders complicating pregnancy, unspecified trimester: Principal | ICD-10-CM | POA: Diagnosis present

## 2012-10-20 DIAGNOSIS — O169 Unspecified maternal hypertension, unspecified trimester: Secondary | ICD-10-CM | POA: Diagnosis present

## 2012-10-20 DIAGNOSIS — Z3490 Encounter for supervision of normal pregnancy, unspecified, unspecified trimester: Secondary | ICD-10-CM

## 2012-10-20 DIAGNOSIS — O34219 Maternal care for unspecified type scar from previous cesarean delivery: Secondary | ICD-10-CM | POA: Diagnosis present

## 2012-10-20 DIAGNOSIS — O09529 Supervision of elderly multigravida, unspecified trimester: Secondary | ICD-10-CM | POA: Diagnosis present

## 2012-10-20 HISTORY — DX: Encounter for supervision of normal pregnancy, unspecified, unspecified trimester: Z34.90

## 2012-10-20 LAB — RAPID URINE DRUG SCREEN, HOSP PERFORMED
Amphetamines: NOT DETECTED
Barbiturates: NOT DETECTED
Benzodiazepines: NOT DETECTED
Cocaine: NOT DETECTED
Opiates: NOT DETECTED
Tetrahydrocannabinol: NOT DETECTED

## 2012-10-20 LAB — ACETAMINOPHEN LEVEL: Acetaminophen (Tylenol), Serum: <15 ug/mL (ref 10–30)

## 2012-10-20 LAB — COMPREHENSIVE METABOLIC PANEL
ALT: <5 U/L (ref 0–35)
AST: 15 U/L (ref 0–37)
Alkaline Phosphatase: 152 U/L — ABNORMAL HIGH (ref 39–117)
Calcium: 8.7 mg/dL (ref 8.4–10.5)
GFR calc Af Amer: >90 mL/min (ref >90)
Glucose, Bld: 97 mg/dL (ref 70–99)
Potassium: 2.9 mEq/L — ABNORMAL LOW (ref 3.5–5.1)
Sodium: 139 mEq/L (ref 135–145)
Total Protein: 6.9 g/dL (ref 6.0–8.3)

## 2012-10-20 LAB — CBC
Hemoglobin: 9.8 g/dL — ABNORMAL LOW (ref 12.0–15.0)
MCH: 28.9 pg (ref 26.0–34.0)
MCHC: 34.3 g/dL (ref 30.0–36.0)
Platelets: 275 10*3/uL (ref 150–400)
RBC: 3.39 MIL/uL — ABNORMAL LOW (ref 3.87–5.11)

## 2012-10-20 MED ORDER — HALOPERIDOL 5 MG PO TABS
5.0000 mg | ORAL_TABLET | Freq: Four times a day (QID) | ORAL | Status: DC | PRN
  Administered 2012-10-21: 5 mg via ORAL
  Filled 2012-10-20 (×3): qty 1

## 2012-10-20 MED ORDER — POTASSIUM CHLORIDE CRYS ER 20 MEQ PO TBCR
40.0000 meq | EXTENDED_RELEASE_TABLET | Freq: Once | ORAL | Status: AC
  Administered 2012-10-21: 40 meq via ORAL
  Filled 2012-10-20 (×2): qty 2

## 2012-10-20 NOTE — ED Notes (Addendum)
GPD at [facility] explaining IVC. Pt argumentative and does not verbalize understanding. Pt reluctantly allowed PBT to draw blood and [place] tech to do [place] at [facility]. Pt would not lie down for [place].  EDPA on phone with pharmacy regarding medications. Pending contact with psychiatry (contact made with [facility]). Sitter at [employer]. Plan explained. Pt does not verbalize understanding. Pt reluctant and argumentative. EDP Dr. Dierdre Highman aware. Mobile crisis here for pt.

## 2012-10-20 NOTE — ED Notes (Signed)
House Coverage aware of need for sitter.  

## 2012-10-20 NOTE — ED Notes (Signed)
Anitra Lauth, MD notified re: pts refusal of changing into a gown

## 2012-10-20 NOTE — ED Notes (Signed)
Pt brought to ED by her aunt because aunt is concerned about pt being pregnant and pt states she is not.  Pt states, "I need someone with a metal detector that can tell me what this is inside of me.  A GPS company put something inside me and it is moving around and hitting my spine."  Pt denies suicidal and homicidal ideation.

## 2012-10-20 NOTE — ED Notes (Signed)
Contacted Phlebtomy re: need for blood draw, spoke with [place] informed that technician is coming now for bedside US procedure, GPD aware & available as needed d/t pt being aggressive

## 2012-10-20 NOTE — ED Notes (Signed)
Pt refuses bloodwork at this time.  Urine collected at triage.  Elliot Gurney, Consulting civil engineer notified of pt.

## 2012-10-20 NOTE — ED Provider Notes (Signed)
History    CSN: [id number] Arrival date & time 10/20/12  1901  First MD Initiated Contact with Patient 10/20/12 1937     Chief Complaint  Patient presents with  . Medical Clearance   (Consider location/radiation/quality/duration/timing/severity/associated sxs/prior Treatment) HPI Comments: Patient is a 42 year old female with history of delusions and a mental disorder that is unspecified presents today for evaluation of an electronic device in her abdomen causing distention. She's been evaluated for this previously and states that no one will help her. She repeats that she is not pregnant and that it is a mechanical device inside of her belly. She states it has been going on for "too long". She does not recall her last menstrual period because of this electronic device that has stopped her period. She reports that she gets intermittent cramping pain that resolve spontaneously. She denies any drug or alcohol use. She denies any SI/HI.  The history is provided by the patient and a parent. No language interpreter was used.   Past Medical History  Diagnosis Date  . Mental disorder    Past Surgical History  Procedure Laterality Date  . Cesarean section  9 yrs ago    No family history on file. History  Substance Use Topics  . Smoking status: Never Smoker   . Smokeless tobacco: Not on file  . Alcohol Use: No   OB History   Grav Para Term Preterm Abortions TAB SAB Ect Mult Living   1              Review of Systems  Unable to perform ROS: Psychiatric disorder  Gastrointestinal: Positive for abdominal distention.    Allergies  Review of patient's allergies indicates no known allergies.  Home Medications  No current outpatient prescriptions on file. BP 163/93  Pulse 68  Temp(Src) 98.6 F (37 C) (Oral)  Resp 16  SpO2 97% Physical Exam  Nursing note and vitals reviewed. Constitutional: She is oriented to person, place, and time. She appears well-developed and  well-nourished. No distress.  HENT:  Head: Normocephalic and atraumatic.  Right Ear: External ear normal.  Left Ear: External ear normal.  Nose: Nose normal.  Mouth/Throat: Oropharynx is clear and moist.  Eyes: Conjunctivae are normal.  Neck: Normal range of motion.  Cardiovascular: Normal rate, regular rhythm and normal heart sounds.   Pulmonary/Chest: Effort normal and breath sounds normal. No stridor. No respiratory distress. She has no wheezes. She has no rales.  Abdominal: Soft. Bowel sounds are normal. She exhibits distension.  Musculoskeletal: Normal range of motion.  Neurological: She is alert and oriented to person, place, and time. She has normal strength.  Skin: Skin is warm and dry. She is not diaphoretic. No erythema.  Psychiatric: Her behavior is normal. Her affect is angry. She expresses inappropriate judgment. She expresses no homicidal and no suicidal ideation.    ED Course  Procedures (including critical care time) Labs Reviewed  CBC - Abnormal; Notable for the following:    RBC 3.39 (*)    Hemoglobin 9.8 (*)    HCT 28.6 (*)    All other components within normal limits  COMPREHENSIVE METABOLIC PANEL - Abnormal; Notable for the following:    Potassium 2.9 (*)    Albumin 2.8 (*)    Alkaline Phosphatase 152 (*)    All other components within normal limits  SALICYLATE LEVEL - Abnormal; Notable for the following:    Salicylate Lvl <2.0 (*)    All other components within normal limits  ACETAMINOPHEN LEVEL  ETHANOL  URINE RAPID DRUG SCREEN (HOSP PERFORMED)   US Ob Limited  10/20/2012   *RADIOLOGY REPORT*  Clinical Data:  Evaluate for viability and fetal age.  The patient has schizophrenia and is uncooperative.  LIMITED OBSTETRIC ULTRASOUND  Number of Fetuses: 1 Heart Rate: 128bpm Movement:  Present Presentation: Cephalic Placental Location: Anterior/superior  Amniotic Fluid (Subjective): Normal  BPD:  8.7cm   34w    6d  MATERNAL FINDINGS: Cervix:  Not visualized.   IMPRESSION: Intrauterine pregnancy of approximately 34 weeks 6 days with fetal heart rate of 128 beats per minute.  Exam degraded by patient clinical status and lack of cooperation.  This exam is performed on an emergent basis and does not comprehensively evaluate fetal size, dating, or anatomy, and a follow-up complete OB US should be considered if further fetal assessment is warranted.   Original Report Authenticated By: Jeronimo Greaves, M.D.    10:59 PM Called pharmacy for recommendations on medications that could be given that are safe in pregnancy. Will call back with recommendation.   11:10 PM Visual merchandiser for psychiatry and discussed case. Stressed importance of time sensitivity. Would like consult for medication recommendations and move to [facility].   11:28 PM Discussed case with psychiatry PA who gives recommendation of Haldol. He provided me with the phone number for Dr. Elsie Saas.   11:34 PM Discussed case with Dr. Elsie Saas from psychiatry via phone who recommends 3-4 mg with 5 mg as max dose of Haldol q6 hours. Geodon as next best option. 10 mg with option to repeat in 30 min and no more than 20 mg in 24 hours.     1. Psychosis   2. Pregnancy     MDM  Patient is a 42 year old female history of psychosis who presents today with a 34 week and 6 day pregnancy. Fetal heart rate is 128 beats per minute. Patient is not cooperative. She was IVC by mobile crisis. Discussed with pharmacy, psychiatry about medicines that her safer in pregnancy. Dr. Elsie Saas recommended haldol 5mg  q6 hours and Geodon as a last resort. Patient apparently spit out KDur, benadryl, and haldol. Stable for transfer to [facility]. Mobile Crisis is working on bed placement.   Mora Bellman, PA-C 10/21/12 [phone]

## 2012-10-20 NOTE — ED Notes (Signed)
Pt states, "I am not going to the room. I have utilities in my stomach & it is moving around. You probally helped put it in there. All of yall. I don't want to be evaluated. I want to go to my apartment." pt attempting to leave room, GPD & security notified

## 2012-10-20 NOTE — ED Notes (Signed)
GPD outside door with pt visible sitting, pt remains adamant that she is leaving, pt states, " I did not come here to stay. I just wanted them to get this electronic thing out of me. I am not pregnant. I don't need the police to tell me that I am staying."

## 2012-10-20 NOTE — ED Notes (Signed)
Social Worker with Mobile Crisis at bedside

## 2012-10-20 NOTE — ED Notes (Signed)
Per Marcelle Smiling, Child psychotherapist, arrived to ED, reports being called by pts aunt today @ 17:10, per report pts aunt reports the pt arriving at her home & kept saying that "devices put a utility pole in her", pt hx of schizophrenia with unknown time of last medication & what type of medication the pt is taking, pt denied Child psychotherapist eval when the social worker arrived to the pts aunt's home, pt states, "I just want this out of me." pt denies pregnancy, GPD & social worker at bedside

## 2012-10-20 NOTE — ED Notes (Signed)
Elliot Gurney, Agricultural consultant Rn notified re: need for Comptroller

## 2012-10-20 NOTE — ED Notes (Signed)
Pt sitting in chair, declining to speak or answer questions, sitter at [facility], pt alert, NAD, calm at this time.

## 2012-10-20 NOTE — ED Notes (Signed)
Spoke with [place] regarding having bedside US completed, pt will not be transported to [place] for procedure at this time, Anitra Lauth, MD aware, [place] technician estimates procedure to be completed within 30 mins

## 2012-10-21 ENCOUNTER — Encounter (HOSPITAL_COMMUNITY): Payer: Self-pay | Admitting: *Deleted

## 2012-10-21 LAB — URINALYSIS, ROUTINE W REFLEX MICROSCOPIC
Bilirubin Urine: NEGATIVE
Hgb urine dipstick: NEGATIVE
Ketones, ur: NEGATIVE mg/dL
Nitrite: NEGATIVE
Protein, ur: NEGATIVE mg/dL
Specific Gravity, Urine: 1.008 (ref 1.005–1.030)
Urobilinogen, UA: 1 mg/dL (ref 0.0–1.0)

## 2012-10-21 LAB — PROTEIN / CREATININE RATIO, URINE: Total Protein, Urine: 18.6 mg/dL

## 2012-10-21 MED ORDER — ZOLPIDEM TARTRATE 5 MG PO TABS: 5.0000 mg | ORAL_TABLET | Freq: Every evening | ORAL | Status: DC | PRN

## 2012-10-21 MED ORDER — HYDRALAZINE HCL 50 MG PO TABS
50.0000 mg | ORAL_TABLET | Freq: Once | ORAL | Status: AC
  Administered 2012-10-21: 50 mg via ORAL
  Filled 2012-10-21 (×2): qty 1

## 2012-10-21 MED ORDER — ONDANSETRON HCL 4 MG PO TABS: 4.0000 mg | ORAL_TABLET | Freq: Three times a day (TID) | ORAL | Status: DC | PRN

## 2012-10-21 MED ORDER — HYDRALAZINE HCL 20 MG/ML IJ SOLN
10.0000 mg | Freq: Once | INTRAMUSCULAR | Status: DC
  Filled 2012-10-21: qty 1

## 2012-10-21 MED ORDER — DIPHENHYDRAMINE HCL 25 MG PO CAPS
25.0000 mg | ORAL_CAPSULE | Freq: Once | ORAL | Status: AC
  Administered 2012-10-21: 25 mg via ORAL
  Filled 2012-10-21 (×2): qty 1

## 2012-10-21 MED ORDER — ACETAMINOPHEN 325 MG PO TABS: 650.0000 mg | ORAL_TABLET | ORAL | Status: DC | PRN

## 2012-10-21 MED ORDER — ZIPRASIDONE MESYLATE 20 MG IM SOLR
10.0000 mg | Freq: Two times a day (BID) | INTRAMUSCULAR | Status: DC | PRN
  Administered 2012-10-22 – 2012-10-24 (×3): 10 mg via INTRAMUSCULAR
  Filled 2012-10-21 (×4): qty 20

## 2012-10-21 NOTE — ED Notes (Signed)
Pt reluctantly swallowed meds, spit out meds after RN left room per sitter.

## 2012-10-21 NOTE — ED Notes (Signed)
Spoke with Erskine Squibb from [employer].  sts that upon medical review of pt that she had been declined. sts that pt was out of scope of service, abnormal labs.

## 2012-10-21 NOTE — ED Notes (Signed)
Pt continues to spit into trash can, benadryl capsule visible. Pt denies spitting out meds, calm while sitting in chair when left alone, aggitated with staff involvement, offered food and drink, declined. sitter present at [facility]

## 2012-10-21 NOTE — ED Notes (Signed)
Pt offered meds crushed in apple sauce, refused. requested whole pills. crushed meds wasted, pt reluctantly taking meds.

## 2012-10-21 NOTE — Progress Notes (Signed)
Called by ED physician to review medical care needed by this patient. Upon chart review, the patient was brought in by her aunt, who was concerned about patient's safety given her pregnant state. Patient was found to be [redacted] weeks pregnant by a third trimester ultrasound. She denies pregnancy and is currently in a psychotic state. Psychiatry was consulted and recommended IVC.  Patient does not admit to being hypertensive but looking back at previous ED visit, her BP were elevated then (140-150's/80-90's). Patient had positive UDS for cocaine and had elevated BP this evening. Normal LFTs and platelets. UA was negative for protein. In view of normal lab values, I believe we can observe at this time for [facility] in pregnancy. Follow-up urine protein:creatine ratio. If patient cooperates, collect urine for 24 hours to determine 24 hour urine protein. In the meantime, would recommend having psych return to document whether or not the patient is able to provide informed consent for treatment.

## 2012-10-21 NOTE — ED Provider Notes (Signed)
Pt took oral hydralazine D/w dr Ladean Raya with OB She feels this is likely chronic HTN Continue to monitor and capture urine studies if possible Pt currently stable D/w mobile crisis.  Attempting placement at [facility] and she is under review there at this time On call mobile crisis  Through Monday - [phone] Crisis line is [phone]  Joya Gaskins, MD 10/21/12 1929

## 2012-10-21 NOTE — ED Notes (Signed)
Report given to RW, RN, move explained to pt, sitter present with pt, pt's purse bagged and labled, given to RW, Charity fundraiser.

## 2012-10-21 NOTE — ED Provider Notes (Signed)
Medical screening examination/treatment/procedure(s) were conducted as a shared visit with non-physician practitioner(s) and myself.  I personally evaluated the patient during the encounter Patient to [place] for the psychotic who presents with delusions and obvious signs of pregnancy. Patient will not admit that she is pregnant however bedside ultrasound shows her fetus and advanced age and unable to get a complete ultrasound.  Patient has a heart rate of 145 and feel that patient is not currently in labor but no prenatal care.  Will get formal u/s and speak with psych about med recommendations for agitation.  Gwyneth Sprout, MD 10/21/12 2325

## 2012-10-21 NOTE — ED Notes (Signed)
Pt ambulatory to br.  Nadn. Urine sample obtained sent to lab.

## 2012-10-21 NOTE — ED Provider Notes (Signed)
IVC, schizophrenia, delusional denying pregnancy, thinks uterus is metal/electronic device slowly growing, denies abdominal or back pain, refuses to take po meds, receiving Haldol or Geodon for psychosis, L:CTA today, abd gravid and SNT today, FHR ~150 today, [redacted] weeks EGA by [place] yesterday, no prenatal care, Pt does not know if has PMH HTN or not, Mobile Crisis trying to find placement, d/w faculty OB who recommends the following:  Do not treat BP with meds if 140s/90s or less;  DO CALL Faculty OB if Pt's SBP>150 and/or DBP>105.  Await Psych placement. 1650  Hurman Horn, MD 10/22/12 [phone]

## 2012-10-21 NOTE — ED Provider Notes (Signed)
[facility] refused patient Bp improved Discussed urine test with OB, dr Ladean Raya does not feel patient has preeclampsia Continue to monitor BP D/w mobile crisis, patient may be able to go [facility] but not till next week Pt currently stable Will order telepsych for med recommendations and also to determine if patient has capacity to make decisions   Joya Gaskins, MD 10/21/12 2359

## 2012-10-21 NOTE — ED Notes (Signed)
CALLED CHILD PROTECTIVE SERVICES TO REPORT PATIENT PREGNANCY AND POSSIBLE PENDING BIRTH.

## 2012-10-21 NOTE — ED Provider Notes (Signed)
SBP now >180 I spoke to dr Ladean Raya with OB at [facility] hydralazine 10mg  IM and check urine protein/creat ratio if possible She requests call back if no improvement in her SBP after meds   Joya Gaskins, MD 10/21/12 1815

## 2012-10-22 MED ORDER — HYDRALAZINE HCL 50 MG PO TABS
50.0000 mg | ORAL_TABLET | ORAL | Status: DC
  Filled 2012-10-22: qty 1

## 2012-10-22 NOTE — ED Notes (Signed)
Patient awake sitting on side of bed eating dinner with NAD and is now laying down in bed resting. Sitter at door.

## 2012-10-22 NOTE — ED Provider Notes (Signed)
CRITICAL CARE Performed by: Joya Gaskins Total critical care time: 33 Critical care time was exclusive of separately billable procedures and treating other patients. Critical care was necessary to treat or prevent imminent or life-threatening deterioration. Critical care was time spent personally by me on the following activities: development of treatment plan with patient and/or surrogate as well as nursing, discussions with consultants, evaluation of patient's response to treatment, examination of patient, obtaining history from patient or surrogate, ordering and performing treatments and interventions, ordering and review of laboratory studies, ordering and review of radiographic studies, pulse oximetry and re-evaluation of patient's condition.   Joya Gaskins, MD 10/22/12 0000

## 2012-10-22 NOTE — ED Notes (Signed)
Patient continues to close door to room and is verbally abusive while yelling at staff. Patient asked to refrain from yelling and security talked with patient. Patient advised of rules regarding the doors to room being open for patient safety. Patient resting in bed at this time with NAD. Sitter sitting at door.

## 2012-10-22 NOTE — ED Notes (Signed)
Spoke with Sharyne Richters in [place], sts no beds available.

## 2012-10-22 NOTE — ED Notes (Signed)
Mobile Crisis was called and given [facility] #'s [phone] and [phone]. Mobile Crisis to see patient today.

## 2012-10-22 NOTE — ED Notes (Signed)
telepsych md talking with pt. Pt telling him that she does not need a tv psych doc.  md on screen telling pt that she will have to wait until Monday to see a different md.  Pt under blanket not answering md questions.

## 2012-10-22 NOTE — ED Notes (Signed)
First meeting with patient. Patient appears to be sleeping/ resting with NAD at this time. Sitter at bedside.

## 2012-10-22 NOTE — ED Provider Notes (Addendum)
Apparently there is pre-natal psychiatric care associated with [facility]. Sounds like would be best option if avilable. Mobile Crisis attempting to facilitate placement.    Raeford Razor, MD 10/22/12 1739

## 2012-10-22 NOTE — ED Notes (Signed)
Pt. Refused to have vital signs taken. 

## 2012-10-22 NOTE — ED Notes (Signed)
Patient in room eating and a loud noise was heard and when sitter ask the patient if she was all right the patient became very violent and screaming at staff that we are trying to change her into a man and patient refused to calm down and stop screaming. Staff and Police officer attempted to calm patient with no success.

## 2012-10-22 NOTE — ED Provider Notes (Signed)
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    Sunnie Nielsen, MD 10/22/12 [phone]

## 2012-10-22 NOTE — ED Notes (Signed)
Patient sitting in chair with door open to room sitter at door. Patient calm at this time.

## 2012-10-22 NOTE — ED Notes (Signed)
Patient resting with NAD. Sleeping off and on and watching tv. Sitter at bedside.

## 2012-10-22 NOTE — ED Notes (Signed)
Patient resting in bed with NAD. Sitter at door.

## 2012-10-22 NOTE — ED Notes (Signed)
Spoke with md regarding pt.  sts that we will check bp q6 hrs.  Now that [facility] pt we will discuss pt with [place], and mobile crisis.  Pt vss at this time.

## 2012-10-22 NOTE — ED Notes (Signed)
Patient up to restroom with NAD. On return to patient room, patient became verbally abusive to staff and refuses to leave room door open.  Patient ate breakfast with door open and sitter at door.

## 2012-10-23 ENCOUNTER — Encounter (HOSPITAL_COMMUNITY): Payer: Self-pay | Admitting: *Deleted

## 2012-10-23 DIAGNOSIS — Z9119 Patient's noncompliance with other medical treatment and regimen: Secondary | ICD-10-CM

## 2012-10-23 DIAGNOSIS — O169 Unspecified maternal hypertension, unspecified trimester: Secondary | ICD-10-CM | POA: Diagnosis present

## 2012-10-23 DIAGNOSIS — F209 Schizophrenia, unspecified: Secondary | ICD-10-CM

## 2012-10-23 DIAGNOSIS — F22 Delusional disorders: Secondary | ICD-10-CM

## 2012-10-23 MED ORDER — HYDRALAZINE HCL 50 MG PO TABS
50.0000 mg | ORAL_TABLET | Freq: Once | ORAL | Status: AC
  Administered 2012-10-23: 50 mg via ORAL
  Filled 2012-10-23: qty 1

## 2012-10-23 MED ORDER — HYDRALAZINE HCL 50 MG PO TABS
50.0000 mg | ORAL_TABLET | Freq: Four times a day (QID) | ORAL | Status: DC | PRN
  Administered 2012-10-23: 50 mg via ORAL
  Filled 2012-10-23: qty 1

## 2012-10-23 MED ORDER — PRENATAL MULTIVITAMIN CH: 1.0000 | ORAL_TABLET | Freq: Every day | ORAL | Status: DC

## 2012-10-23 MED ORDER — ZIPRASIDONE HCL 20 MG PO CAPS
20.0000 mg | ORAL_CAPSULE | Freq: Two times a day (BID) | ORAL | Status: DC
  Administered 2012-10-24 – 2012-10-25 (×2): 20 mg via ORAL
  Filled 2012-10-23: qty 1

## 2012-10-23 MED ORDER — CALCIUM CARBONATE ANTACID 500 MG PO CHEW: 2.0000 | CHEWABLE_TABLET | ORAL | Status: DC | PRN

## 2012-10-23 MED ORDER — POTASSIUM CHLORIDE CRYS ER 20 MEQ PO TBCR: 40.0000 meq | EXTENDED_RELEASE_TABLET | Freq: Once | ORAL | Status: DC

## 2012-10-23 MED ORDER — HYDRALAZINE HCL 20 MG/ML IJ SOLN: 10.0000 mg | Freq: Once | INTRAMUSCULAR | Status: DC

## 2012-10-23 NOTE — ED Notes (Signed)
Pt. Yelling out saying "I want to go home. You all can't keep me here. I'm not in your custody. Keep the police away from me". [employer] at nurses station. Pt. Made no effort to leave, stayed in room and turned TV on. Pt. Also stating "I'm allergic to that blood pressure medicine. It made me sick". No V/D noted.

## 2012-10-23 NOTE — ED Notes (Signed)
Dr j in to see pt and be sure from a psychiatric point patient is stable [facility]

## 2012-10-23 NOTE — ED Notes (Signed)
IVC paperwork and transfer paperwork given to [facility], report called to [facility].  Pt calm and cooperative upon transfer.  [employer] with [facility] for transfer.

## 2012-10-23 NOTE — Progress Notes (Signed)
Patient ID: Holly Hines, female   DOB: April 04, 1971, 42 y.o.   MRN: [id number]  Late Entry  Reviewed chart today at noon.  BP noted to be elevated to 160s/90s.  Pt has been refusing medications intermittantly as well as an IV.  Pt is on wait list for [facility] prenatal psych bed.  [facility] called and no bed available or in the near future. At this point patient needs to be deemed incompetent and antenatal testing needs to be done to determine fetal status and distinguish between an exacerbation of chronic hypertension vs preeclampsia.  Dr. Shela Hines of psych was contacted who declared pt incompetent and transfer was arranged to [facility].  Discussed care with Dr. Vincenza Hines, MFM who agrees that proper diagnostic testing should be completed to move towards delivery if indicated.  Dr. Shela Hines is to write sedation orders in case pt refuses treatment (including IV).  When pt arrives at [facility], will place pt on monitor, serial BPS with BP management, [place] for growth, AFI, dopplers, rpt labs to evaluate for preeclampsia / HELLP.  GBS should be collected.  Magnesium sulfate as indicated.  Pt is a prior c/s and route of delivery needs to be determined.  Pt is not able to consent to VBAC given mental status.    NST done today at [facility] is nonreactive with moderate variability.  Holly Hines H.

## 2012-10-23 NOTE — ED Notes (Signed)
Rapid response nurse at bedside to perform fetal monitoring

## 2012-10-23 NOTE — ED Notes (Signed)
Dr leggett has called back. She is working with psychiatry to get [facility]

## 2012-10-23 NOTE — Progress Notes (Signed)
Pt arrived to room 166 via [facility]. Sitting in rocking chair, tv on. Requested ice and apple juice and ordered food from menu. Asked pt if I could take her VS or monitor baby at this time. Pt refused. Started asking pt questions required for the admission process but pt is refusing to answer any at this time. Did request to have a social worker come talk to her about "housing after the baby is born". Informed pt I would request a consult and Social Worker would most likely not see her until tomorrow morning.  She did request hand lotion which was supplied. No other requests voiced by the pt. No complaints voiced by pt.

## 2012-10-23 NOTE — ED Notes (Signed)
Pt. Became agitated when asked to take vital signs. Raising voice.

## 2012-10-23 NOTE — ED Notes (Signed)
Pt. Heard talking on phone. When tech went into room pt not on phone, however phone was removed from patient room. Pt. Became agitated stood at doorway, trying to close door. When told the door needs to stay open patient stated "Do you want the lights on? Can you see enough? Do you want to see my ass?" and exposed herself.

## 2012-10-23 NOTE — H&P (Addendum)
Holly Hines is a 42 y.o. female G2P1 at [redacted]w[redacted]d by 10/20/2012 ultrasound presenting for obstetrical evaluation. Patient has been in [facility] Psych ED since 6/27. She has been involuntarily committed at the request of family members who were concerned about her safety and her advanced pregnancy state. Upon arrival on birthing suite, patient appears calm and in no distress. She responds to the questions at times inappropriately and is easily excitable. A medical history cannot clearly be elicited from this patient. She does not confirm a diagnosis of CHTN and does not want to discuss her previous pregnancy. She did admit to having a cesarean section and did not want to have another cesarean section. At that point patient became a little agitated. She declined blood work for this evening, stating that "I will give you blood sample in the morning before you release me". She also requested to speak with social worker regarding housing placement for her and the baby. Patient is currently without any complaints and requested to be left alone-"I need to breathe" were her words.  History OB History   Grav Para Term Preterm Abortions TAB SAB Ect Mult Living   2 1 1       1      Past Medical History  Diagnosis Date  . Mental disorder   . Pregnant    Past Surgical History  Procedure Laterality Date  . Cesarean section  9 yrs ago    Family History: family history is not on file. Social History:  reports that she has never smoked. She does not have any smokeless tobacco history on file. She reports that she does not drink alcohol or use illicit drugs.   Prenatal Transfer Tool  Maternal Diabetes: screening test not yet obtained- Patient does not admit to DM Genetic Screening: too late Maternal Ultrasounds/Referrals: not yet performed Fetal Ultrasounds or other Referrals:  n/a Maternal Substance Abuse:  Yes:  Type: Cocaine Significant Maternal Medications:  Does not admit to taking any  medications Significant Maternal Lab Results:  n/a Other Comments:  Patient had no prenatal care and was admitted on 6/27 in [facility] ED secondary to active psychosis  Review of Systems  Unable to perform ROS     Blood pressure 146/83, pulse 74, temperature 98.3 F (36.8 C), temperature source Oral, resp. rate 18, SpO2 99.00%. Exam Physical Exam  GENERAL: Well-developed, well-nourished female in no acute distress. 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 She is not able to consent for trial of labor after cesarean section and therefore, unless her mental status changes over the course of this admission, plan will be delivery via repeat cesarean section. - I will attempt to contact family members   Dorismar Chay 10/23/2012, 7:18 PM    Spoke with Psych MD on call, Dr. Effie Shy who recommended obtaining baseline EKG since patient received Geodon. In addition, I was able to  speak with the patient's Eloise Levels, who is not aware of the patient's medical history. She did say that the patient is currently homeless and that her other child was born full term and is currently under the care of family members on the father's side. She did agree to provide consent to medical treatment if any were indicated

## 2012-10-23 NOTE — ED Provider Notes (Addendum)
Pt resting, nad, discussed w act team - placement pending.   Suzi Roots, MD 10/23/12 [phone]   Discussed pt with Elsie Lincoln and Dr Elsie Saas - Dr Penne Lash accepts in transfer to [facility], Dr Shela Commons and psych team will assist with psych management.   Rapid response nurse has been with pt, monitoring pt/baby, and communicating with Dr Penne Lash.   Suzi Roots, MD 10/23/12 1538

## 2012-10-23 NOTE — Consult Note (Signed)
Reason for Consult: delusional psychosis Referring Physician: Dr. Denton Lank and Dr. Aldean Baker Holly Hines is an 42 y.o. female.  HPI: Patient is seen in [facility] emergency department for psychiatric assessment. Patient stated that she called mobile crisis few days ago due to back pain and she is about [redacted] weeks pregnant but no formal prenatal care. She has elevated blood pressure and concern about possible preeclampsia. Patient is initially did not realized that she is pregnant because of somatic delusions. She has given Geodon shot which helped her to calm down without agitation. She is willing to be examined at [facility] and willing to cooperate at this time but she is known to be difficult, agitated and non compliant in the past and during this visit. She was previously admitted to [facility] in June 2013 for schizophrenia with delusional thoughts. Patient is interpreting her pains as back pain and need Ibuprofen when she called mobile crisis.   MSE: Appeared sitting on bed and watching televisions. She has not exhibited agitation but partially cooperative. She has made statement that she has no psychiatric problem and does not want geodon shots but willing to take geodon pills. She has not appreard in distress. She continue to have delusions and says someone put some electronic stuff in her stomach and she has denied auditory and visual hallucinations. She has poor insight, judgment and impulse control.   Past Medical History  Diagnosis Date  . Mental disorder   . Pregnant     Past Surgical History  Procedure Laterality Date  . Cesarean section  9 yrs ago     History reviewed. No pertinent family history.  Social History:  reports that she has never smoked. She does not have any smokeless tobacco history on file. She reports that she does not drink alcohol or use illicit drugs.  Allergies: No Known Allergies  Medications: I have reviewed the patient's current medications.  Results for orders  placed during the hospital encounter of 10/20/12 (from the past 48 hour(s))  PROTEIN / CREATININE RATIO, URINE     Status: Abnormal   Collection Time    10/21/12 10:41 PM      Result Value Range   Creatinine, Urine 97.16     Total Protein, Urine 18.6     Comment: NO NORMAL RANGE ESTABLISHED FOR THIS TEST   PROTEIN CREATININE RATIO 0.19 (*) 0.00 - 0.15    No results found.  Positive for aggressive behavior, anxiety, bad mood, behavior problems, mood swings and delusions and non compliance. Blood pressure 169/94, pulse 80, temperature 98.3 F (36.8 C), temperature source Oral, resp. rate 16, SpO2 100.00%.   Assessment/Plan: Schizophrenia with somatic delusions Non compliance with treatment  Recommendation:  1. Patient has been placed on IVC due to uncooperative behavior and delusional psychosis 2. Patient will be transferred to [facility] hospital for complete assessment and possible treatment needs 3. Patient has been deemed incapacitated at this time due to psychosis and unable to understand her medical condition and proposed treatments 4. Patient may offer Geodon 20 mg Q12hours / PRN to control agitation and aggression 5. Will provide Geodon 10 mg IM and may repeat in one hour if needed - do not exceed more that 20 mg within a day  6. Patient may prefer pills over shots to achieve better cooperation.   La Dibella,JANARDHAHA R. 10/23/2012, 3:40 PM

## 2012-10-23 NOTE — Progress Notes (Signed)
.  Faculty Practice OB/GYN Attending Phone Call Documentation  I received a call from the RN taking care of Holly Hines, a 42 y.o. G2P1001 at [redacted]w[redacted]d who is in the [facility] ER awaiting involuntary admission for paranoid schizophrenia.  She was noted to have elevated BP, BP currently is 169/94.  Dr. Penne Lash (Attending on call) was notified and she ordered IM hydralazine which the patient refused as she said she will only take oral medications.  Oral hydralazine prescribed with parameters.  According to her RN and Rapid OB Reponse RN (ROB), she has no current concerning symptoms for preeclampsia. Her BP and symptoms will be monitored closely, she may need more medication and magnesium sulfate eclampsia prophylaxis if her BP remains in severe range (SBP >160 and/or DBP>110).  Of note, patient was also reported to be having mild-moderate contractions every two - four minutes noted on tocometer, patient does not acknowledge feeling these contractions and is not allowing the ROB to do a cervical check.  FHR tracing is reactive; Category I.  She will continue to be monitored for any worsening signs/symptoms of elevated BP and for signs/symptoms of preterm labor.    Jaynie Collins, MD, FACOG Attending Obstetrician & Gynecologist Faculty Practice, [facility] [place]

## 2012-10-23 NOTE — ED Notes (Signed)
Called dr leggett again about changing order from im to po.

## 2012-10-23 NOTE — Progress Notes (Addendum)
ED CM, spoke with [facility] ED nurse in regards to patients disposition. She states, that she is waiting on [facility] and Officer to transfer to [facility] for admission for further evaluation. Beecher Mcardle RN BSN

## 2012-10-23 NOTE — ED Notes (Signed)
Patient animate about not taking the injection. Dr leggett called [phone] to change order to po

## 2012-10-23 NOTE — Progress Notes (Addendum)
1325  Called for NST.  Patient currently resting in low fowlers.  Denies abdominal pain at this time.  When asked if feeling tightening staes yes sometimes.  Does not appear to be laboring. Not breathing through uc's or giving any outward signs that she feels them.1400  EFM removed after 30 min strip.  See EFM documentation.1445 EFM reviewed by dr. Penne Lash.

## 2012-10-23 NOTE — Progress Notes (Signed)
Pt states "my baby is fine, I dont need you to listen"

## 2012-10-23 NOTE — ED Notes (Signed)
Per rapid response nurse patient is contracting now. Patient is not "reactive" to the contractions

## 2012-10-24 ENCOUNTER — Inpatient Hospital Stay (HOSPITAL_COMMUNITY): Payer: Self-pay

## 2012-10-24 MED ORDER — BETAMETHASONE SOD PHOS & ACET 6 (3-3) MG/ML IJ SUSP
12.0000 mg | Freq: Once | INTRAMUSCULAR | Status: AC
  Administered 2012-10-25: 12 mg via INTRAMUSCULAR
  Filled 2012-10-24: qty 2

## 2012-10-24 MED ORDER — LACTATED RINGERS IV SOLN
INTRAVENOUS | Status: DC
  Administered 2012-10-25 (×2): via INTRAVENOUS

## 2012-10-24 MED ORDER — LABETALOL HCL 5 MG/ML IV SOLN
20.0000 mg | INTRAVENOUS | Status: DC | PRN
  Filled 2012-10-24: qty 4

## 2012-10-24 MED ORDER — DIPHENHYDRAMINE HCL 50 MG/ML IJ SOLN
50.0000 mg | Freq: Four times a day (QID) | INTRAMUSCULAR | Status: DC | PRN
  Administered 2012-10-24: 50 mg via INTRAMUSCULAR
  Filled 2012-10-24: qty 1

## 2012-10-24 MED ORDER — HALOPERIDOL LACTATE 5 MG/ML IJ SOLN
5.0000 mg | Freq: Four times a day (QID) | INTRAMUSCULAR | Status: DC | PRN
  Administered 2012-10-24: 5 mg via INTRAMUSCULAR
  Filled 2012-10-24: qty 1

## 2012-10-24 MED ORDER — MAGNESIUM SULFATE BOLUS VIA INFUSION
6.0000 g | Freq: Once | INTRAVENOUS | Status: AC
  Administered 2012-10-25: 6 g via INTRAVENOUS
  Filled 2012-10-24: qty 500

## 2012-10-24 MED ORDER — HYDRALAZINE HCL 20 MG/ML IJ SOLN
10.0000 mg | Freq: Once | INTRAMUSCULAR | Status: AC
  Administered 2012-10-25: 10 mg via INTRAVENOUS
  Filled 2012-10-24: qty 1

## 2012-10-24 MED ORDER — MAGNESIUM SULFATE 40 G IN LACTATED RINGERS - SIMPLE
2.0000 g/h | INTRAVENOUS | Status: DC
  Filled 2012-10-24: qty 500

## 2012-10-24 NOTE — Progress Notes (Addendum)
Pt laying on couch. At bedside with 2 security, 5 nurses, Dr. Shawnie Pons, K. Clelia Croft, cnm, Media planner. Explained to pt about POC. Pt belligerent and refused POC and refused to get in bed. Held down by security and medication administered.

## 2012-10-24 NOTE — Progress Notes (Signed)
Family and MD left bedside. Pt laying on couch. Environment safe and secure

## 2012-10-24 NOTE — Progress Notes (Signed)
Pt sitting at bedside.  Dinner tray given to pt.  Pt agreeable with meal.

## 2012-10-24 NOTE — Progress Notes (Addendum)
Pt up to bathroom and dumped urine. The urine has been collected since 10/23/2012 2000 for 24hour urine. RN explained to pt not to dump urine just leave it in the bathroom for RN to place in container. The pt said " I thought you had all you needed so I didn't keep it". Pt refused to drink glucola for Glucose testing. Pt said she didn't need to drink that stuff.

## 2012-10-24 NOTE — Progress Notes (Signed)
Tech from [employer] at bedside.  Introduced to pt.  Pt continues to lie on couch and refuses any medical interventions.  Pt refusing to communicate with this RN but asked tech to cool down room.

## 2012-10-24 NOTE — Progress Notes (Signed)
Pt in good spirits after eating dinner but when asked to monitor the baby, get her vital signs and take medication, the pt continues to refuse all treatments.  Dr Shawnie Pons notified of situation.

## 2012-10-24 NOTE — Progress Notes (Signed)
Pt agitated with RN when came in to room to ask about drinking glucose drink for testing. Refused to monitor baby.

## 2012-10-24 NOTE — Progress Notes (Signed)
[name] collaborated with H. Nail/Lead [facility] [name].  [name] faxed medical information to [facility] for possible transfer for more appropriate psych symptom management.  [name] will follow up tomorrow to see status.  H. Nail states the tech who will come to assist with pt's care will not be able to physically restrain pt, but will be able to assist in techniques to provide care.  The case continues to be that we can administer medication without pt's approval if it is deemed medically necessary to provide care to her since she is involuntarily committed and has been deemed incompetent by hospital psychiatrist.  Two MDs can order/sign for needed medical treatment.  Kathie Rhodes Taylor/[facility] director states he has spoke with psychiatrist to request a consult with pt tomorrow to re-evaluate and make medication dosing recommendations.  [name] available for support.

## 2012-10-24 NOTE — Progress Notes (Addendum)
Pt lying on couch.  Attempted to discuss the day's POC but the pt refused to talk with this RN.  Stated that I would be back to discuss the POC at a later time...one hour.  Pt verbalized understanding.  Will keep attempting to obtain bloodwork and urine.  Sitter at bedside per protocol.  Environment is safe and secure.

## 2012-10-24 NOTE — Progress Notes (Signed)
Introduced self to pt as Public relations account executive. Dr. Shawnie Pons at bedside as well as sitter and 3 family members. Haldol given to pt, pt refused. Able to obtain BP and FHR. BP elevated. Pt refused an medication or form of treatment at this time. Dr. Shawnie Pons talking to pt and family abt possible stroke, seizures and death of pt and child if pt continues to refuse care.

## 2012-10-24 NOTE — Progress Notes (Signed)
Pt refuses this RN to perform a shift assessment.  Unable to determine if pt has previous C/S scar on abdomen.  Pt on couch wrapped up in several blankets.

## 2012-10-24 NOTE — Progress Notes (Signed)
Pt continues to sleep on couch.  Requested to monitor baby and take vital signs and to see if it would be OK to get some bloodwork.  Pt continues to refuse interventions.  Pt is arousable but is obviously annoyed with the constant intrusions to her sleep.  Displays a flat affect and refuses eye contact with this RN.  Told pt will be back in one hour to reassess her feelings on treatment.

## 2012-10-24 NOTE — Progress Notes (Addendum)
Pt asleep on side on couch.  Arousable.  Discussed importance of monitoring the baby and obtaining vital signs with the pt.  Pt refusing interventions at this time.  Told pt will come back in one hour.  Pt was agreeable to this plan.   Placed container in toilet to assist with collecting urine.

## 2012-10-24 NOTE — Progress Notes (Signed)
Pt continues to refuse any medical interventions.  Social work is in Solicitor with [employer] to get a consultation.

## 2012-10-24 NOTE — Progress Notes (Signed)
Pt sitting quietly in room.  Family (aunt and brother) was called by this Charity fundraiser.  Requested them to come and support the patient.

## 2012-10-24 NOTE — Progress Notes (Signed)
Team from [facility] at bedside.   Attempted to communicate with the pt and assist with getting pt to accept medical interventions here at [facility].  Pt was belligerent and stated, "her blood pressure was fine and we didn't know what we were doing".  Pt continues to refuse medical interventions.    Team assisted this RN and social work in collecting paperwork to send to [facility] for possible transfer of care to their psychiatric unit.  An employee of Behavioral Health has agreed to come and sit with pt this shift and attempt to assist the staff in administering medications for her treatment of schizophrenia.  The Behavioral [facility] team will attempt to staff the pt with sitter's to assist in taking care of the pt.

## 2012-10-24 NOTE — Progress Notes (Signed)
Social work at bedside for consult.  Pt had requesting consult on previous shift to discuss housing issues after her delivery.  Pt declines consult at this time and states she "never wanted to see social work" and "it's none of our business".  Environment safe and secure.  Sitter remains at bedside per protocol.

## 2012-10-24 NOTE — Progress Notes (Signed)
Pt has been in the rocking chair, writing on paper and laying in the bed. She seems very restless, offered ambien for rest pt refused.

## 2012-10-24 NOTE — Progress Notes (Signed)
Offered to order meal for patient.  Emphasized importance of keeping up her strength and making sure her baby is eating.  Ignored RN but gave order to tech for RN to order.

## 2012-10-24 NOTE — Progress Notes (Signed)
Dr Shawnie Pons, 2 members from security, the sitter from [employer] and the charge RN at bedside for administration of Geodon.  Pt was combative and agitated at having to get a shot and grabbed the RN by the hand grabbing the needle.  Offered the pill form.  Pt promptly induced vomiting after swallowing pill.  Security then restrained the patient and the RN gave the shot in her left deltoid.  Pt sat quietly on the couch after administration with the sitter at the bedside.

## 2012-10-24 NOTE — Progress Notes (Signed)
Pt continues to refuse any medical interventions.  Remains on couch.  Refuses for this RN to perform shift assessment.  Pt agreeable for this RN to return in one hour.  It is this RN's hope that these brief encounters will assist in building up trust with this pt to allow access to examine her and obtain bloodwork and urine.

## 2012-10-24 NOTE — Progress Notes (Signed)
Pt in bathroom in tub. Asking for clean gown and wanting to know when will we be doing her laundry.

## 2012-10-24 NOTE — Progress Notes (Addendum)
Patient ID: Holly Hines, female   DOB: Dec 20, 1970, 42 y.o.   MRN: [id number] FACULTY PRACTICE ANTEPARTUM(COMPREHENSIVE) NOTE  Holly Hines is a 42 y.o. G2P1001 with Estimated Date of Delivery: 11/26/12   By  Third trimester ultrasound [redacted]w[redacted]d  who is admitted for evaluation of hypertension in pregnancy.    Fetal presentation is unsure. Length of Stay:  4  Days  Date of admission:10/20/2012  Subjective: Patient uncooperative this morning, found awake lying on sofa. Patient was restless all night according to nurse. Does not want any intervention as she wants to be discharged from this hospital. She is refusing blood work and 1hr glucola. She discarded 12 hours of urine collection. She reports pain all over her body. She is refusing NST this morning. Patient reports the fetal movement as active. Patient reports uterine contraction  activity as uncertain.  Vitals:  Blood pressure 148/81, pulse 58, temperature 98.3 F (36.8 C), temperature source Oral, resp. rate 18, SpO2 99.00%. Filed Vitals:   10/23/12 1649 10/23/12 1859 10/23/12 2014 10/24/12 0048  BP: 166/91 146/83 153/100 148/81  Pulse: 85 74 85 58  Temp: 97.6 F (36.4 C) 98.3 F (36.8 C)    TempSrc: Oral Oral    Resp: 18  20 18   SpO2: 99%      Physical Examination:  Declined by patient  Fetal Monitoring:  Baseline: 120 bpm   Patient declined complete NST  Labs:  No results found for this or any previous visit (from the past 24 hour(s)).  Imaging Studies:      Medications:  Scheduled . hydrALAZINE  10 mg Intramuscular Once  . potassium chloride  40 mEq Oral Once  . prenatal multivitamin  1 tablet Oral Q1200  . ziprasidone  20 mg Oral BID WC   I have reviewed the patient's current medications.  ASSESSMENT: Patient Active Problem List   Diagnosis Date Noted  . Hypertension in pregnancy, antepartum 10/23/2012  . Schizophrenia, paranoid 10/10/2011    PLAN: 42 yo G2P1 at 35 weeks with hypertension in pregnancy -  Will continue to attempt to provide obstetrical care - Follow up MFM consult - Will continue to attempt obtaining prenatal lab - Will consult with psychiatry again for better management of her psychosis as delivery does not appear to be eminent at this time. Being started on an antipsychotic will perhaps stabilize her mental health enough to allow for better obstetrical care.   Holly Hines 10/24/2012,7:07 AM    Psychiatry consult placed at 7:48 am specifically requesting for medical management of psychosis to allow patient to be more responsive to our attempt at delivering obstetrical care. Dr. Shela Commons is the physician on call today

## 2012-10-24 NOTE — Progress Notes (Signed)
Family at the bedside.  Informed of patient's status.  At the bedside providing support to the patient.

## 2012-10-24 NOTE — Progress Notes (Addendum)
[name] received consult to see patient due to active psychosis, homelessness and substance use.  [name] attempted to meet with patient to complete assessment, but pt refused to speak to [name].  She states she did not ask to speak to a Child psychotherapist.  [name] attempted to explain that [name] would like to talk about where she has been living and see if [name] can assist her with making a plan for her and her baby, but she replied, "that is none of your business."  Pt mumbled some other things that [name] could not understand.  [name] did not push her to talk and asked her to let her RN know if she would like to talk to [name] at any time.  [name] spoke to [name] Director/Hope Rife regarding inability of medical team to monitor baby and provide care due to patient's refusal of medical interventions.  H. Rife confirmed medical team's ability to force treatment on patient, including restraining and injecting sedation medication if necessary, since she is involuntarily committed and refusing medical treatment.  [name] has discussed this with Dr. Almira Bar, S. Earl/RN, L. Ericia Moxley/AC and A. Parker/Risk Management.  AJimmey Ralph is awaiting a call from Director of [facility] to request assistance with executing forced treatment on a patient who is actively psychotic, as this patient is.

## 2012-10-24 NOTE — Progress Notes (Signed)
Called to assist with medicating Ms. Holly Hines who is IVC. Given Benadryl 50 mg and Haldol 5 mg IM R VL and R DG. Pt swinging arms, combative. Security present.

## 2012-10-25 ENCOUNTER — Encounter (HOSPITAL_COMMUNITY): Payer: Self-pay | Admitting: *Deleted

## 2012-10-25 DIAGNOSIS — F200 Paranoid schizophrenia: Secondary | ICD-10-CM

## 2012-10-25 DIAGNOSIS — O139 Gestational [pregnancy-induced] hypertension without significant proteinuria, unspecified trimester: Secondary | ICD-10-CM

## 2012-10-25 DIAGNOSIS — F29 Unspecified psychosis not due to a substance or known physiological condition: Secondary | ICD-10-CM

## 2012-10-25 LAB — TYPE AND SCREEN
ABO/RH(D): O POS
Antibody Screen: NEGATIVE

## 2012-10-25 LAB — COMPREHENSIVE METABOLIC PANEL
Alkaline Phosphatase: 149 U/L — ABNORMAL HIGH (ref 39–117)
BUN: 9 mg/dL (ref 6–23)
Creatinine, Ser: 0.66 mg/dL (ref 0.50–1.10)
GFR calc Af Amer: >90 mL/min (ref >90)
Glucose, Bld: 87 mg/dL (ref 70–99)
Potassium: 3.4 mEq/L — ABNORMAL LOW (ref 3.5–5.1)
Total Bilirubin: 0.4 mg/dL (ref 0.3–1.2)
Total Protein: 6.1 g/dL (ref 6.0–8.3)

## 2012-10-25 LAB — DIFFERENTIAL
Basophils Absolute: 0 10*3/uL (ref 0.0–0.1)
Eosinophils Relative: 2 % (ref 0–5)
Lymphocytes Relative: 29 % (ref 12–46)
Monocytes Absolute: 0.6 10*3/uL (ref 0.1–1.0)
Monocytes Relative: 10 % (ref 3–12)
Neutro Abs: 3.7 10*3/uL (ref 1.7–7.7)
Neutrophils Relative %: 60 % (ref 43–77)

## 2012-10-25 LAB — CBC
HCT: 28.9 % — ABNORMAL LOW (ref 36.0–46.0)
Hemoglobin: 9.9 g/dL — ABNORMAL LOW (ref 12.0–15.0)
MCHC: 34.3 g/dL (ref 30.0–36.0)
MCV: 83.8 fL (ref 78.0–100.0)
RDW: 13 % (ref 11.5–15.5)

## 2012-10-25 LAB — PROTEIN / CREATININE RATIO, URINE: Creatinine, Urine: 38.58 mg/dL

## 2012-10-25 MED ORDER — HALOPERIDOL LACTATE 5 MG/ML IJ SOLN
2.0000 mg | Freq: Four times a day (QID) | INTRAMUSCULAR | Status: DC | PRN
  Filled 2012-10-25: qty 1

## 2012-10-25 MED ORDER — DIPHENHYDRAMINE HCL 50 MG/ML IJ SOLN
25.0000 mg | Freq: Four times a day (QID) | INTRAMUSCULAR | Status: DC | PRN
  Administered 2012-10-25: 50 mg via INTRAVENOUS

## 2012-10-25 MED ORDER — HALOPERIDOL LACTATE 5 MG/ML IJ SOLN
2.0000 mg | Freq: Four times a day (QID) | INTRAMUSCULAR | Status: DC | PRN
  Administered 2012-10-25 (×2): 5 mg via INTRAVENOUS
  Filled 2012-10-25 (×2): qty 1

## 2012-10-25 MED ORDER — DIPHENHYDRAMINE HCL 50 MG/ML IJ SOLN
25.0000 mg | Freq: Four times a day (QID) | INTRAMUSCULAR | Status: DC | PRN
  Filled 2012-10-25: qty 1

## 2012-10-25 NOTE — Progress Notes (Signed)
Refusing assessment 

## 2012-10-25 NOTE — Discharge Summary (Signed)
Physician Discharge Summary  Patient ID: Holly Hines MRN: [id number] DOB/AGE: 11-03-1970 42 y.o.  Admit date: 10/20/2012 Discharge date: 10/25/2012  Admission Diagnoses: Active psychosis with hypertension in third trimester of pregnacy  Discharge Diagnoses: same Active Problems:   Hypertension in pregnancy, antepartum   Discharged Condition: stable  Hospital Course: Patient admitted under involuntary committment secondary to active psychosis due to her schizophrenia. Patient also found to be in third trimester of pregnancy with hypertension. Patient not very cooperative with history taking throughout her admission. Upon review of previous admisison, it appears that she has chronic hypertension but superimposed preeclampsia cannot be ruled out. Upon her arrival, patient refused all interventions including medications, lab test and NST. She was administered Haldol and Benadryl by force to allow for an ultrasound to be performed. She later agreed to have an IV started, through which she received an additional dose of Haldol and was started on magnesium sulfate for seizure prophylaxis. Psychiatry was consulted but is not involved in the active management of the patient's psychosis. Due to her psychosis, it has been difficult to provide her with adequate obstetrical care and adequately assess fetal status. Continued attempt were made to transfer her to a facility that can offer her better psychiatric care in order to facilitate obstetrical care. Dr. Vincenza Hews from [facility] hospital accepted the transfer. Throughout her stay, patient never reported abdominal pain or vaginal bleeding. She never allowed a physical exam to be performed.  Consults: psychiatry  Significant Diagnostic Studies: labs: Hg 9.9, Platelets 305  Cr 0.66  AST/ALT 13/5 and radiology: Ultrasound: 33 weeks fetus. Limited ultrasound secondary to patient being uncooperative  Treatments: steroids: betamethasone and IV magnesium sulfate  and haldol. Geodon   Discharge Exam: Blood pressure 142/87, pulse 80, temperature 98.3 F (36.8 C), temperature source Oral, resp. rate 20, SpO2 99.00%. General appearance: alert, no distress and uncooperative GI: gravid with visibale fetal movement Patient declined examination   FHT- 135 bmp x 2 minutes. Patient declined full NST  Disposition: Transfer to [facility]     Medication List    Notice   You have not been prescribed any medications.       Signed: Moxie Kalil 10/25/2012, 11:34 AM

## 2012-10-25 NOTE — Progress Notes (Signed)
Pt refusing all treatment including assessment stating that we won't let her wake up.  Pt also refusing to measure urine stating that we keep trying to take things out of her and that it is nothing but salt water anyway.

## 2012-10-25 NOTE — Progress Notes (Signed)
[facility] and Sheriff in room for transport to [employer].  Pt angry and asking for gown and socks. Not combative but also not cooporative.  Continuing to refuse vital signs and fetal heart tones for both L&D staff and [facility] staff.

## 2012-10-25 NOTE — Progress Notes (Signed)
[name] left message for [facility] to follow up from yesterday regarding a possible bed for patient.  [name] is awaiting a call back.

## 2012-10-25 NOTE — Progress Notes (Signed)
Pt continues to refuse for staff to touch her

## 2012-10-25 NOTE — Progress Notes (Signed)
Patient ID: Holly Hines, female   DOB: Oct 01, 1970, 42 y.o.   MRN: [id number] FACULTY PRACTICE ANTEPARTUM(COMPREHENSIVE) NOTE  Holly Hines is a 42 y.o. G2P1001 at [redacted]w[redacted]d by third trimester u/s who is admitted for concern about pregnancy.  She was brought to the hospital by an aunt.  She reported for evaluation of an electronic device in her abdomen causing distention.  She states it has been going on for "too long". She does not recall her last menstrual period because of this electronic device that has stopped her period.  During presentation, she was found to be hypertensive. Has had eventful day, since transfer to [facility] with forcible medication with Geodon, then Haldol and Benadryl, now giving it IV.  We finally got IV access, she received IV labetalol and hydralazine and an U/S which dated her earlier than predicted. She has received IV Magnesium 6 gm load and is now on 2 g/hour. Her BP went from 179/112--140-160/80-90. She is more agreeable to transfer today.  She could not go last pm secondary to lack of [facility] PD support.  We have managed to document FHR although full NST is not possible.  Fetal presentation is cephalic. Length of Stay:  5  Days  Subjective: Pt. Is not cooperative with history taking.  She lies on the couch and refuses to get in the bed. She does not answer questions about the fetus, it's movement.  States she is fine and does not need our medical attention.   Vitals:  Blood pressure 142/87, pulse 80, temperature 98.3 F (36.8 C), temperature source Oral, resp. rate 18, SpO2 99.00%. Physical Examination:  General appearance - uncooperative and lying on the bed.  No acute distress Fundal Height:  size equals dates   Fetal Monitoring:  Baseline: 135 bpm  Labs:  Results for orders placed during the hospital encounter of 10/20/12 (from the past 24 hour(s))  CBC   Collection Time    10/25/12 12:05 AM      Result Value Range   WBC 6.3  4.0 - 10.5 K/uL   RBC  3.45 (*) 3.87 - 5.11 MIL/uL   Hemoglobin 9.9 (*) 12.0 - 15.0 g/dL   HCT 40.9 (*) 81.1 - 91.4 %   MCV 83.8  78.0 - 100.0 fL   MCH 28.7  26.0 - 34.0 pg   MCHC 34.3  30.0 - 36.0 g/dL   RDW 78.2  95.6 - 21.3 %   Platelets 305  150 - 400 K/uL  COMPREHENSIVE METABOLIC PANEL   Collection Time    10/25/12 12:05 AM      Result Value Range   Sodium 133 (*) 135 - 145 mEq/L   Potassium 3.4 (*) 3.5 - 5.1 mEq/L   Chloride 99  96 - 112 mEq/L   CO2 23  19 - 32 mEq/L   Glucose, Bld 87  70 - 99 mg/dL   BUN 9  6 - 23 mg/dL   Creatinine, Ser 0.86  0.50 - 1.10 mg/dL   Calcium 9.1  8.4 - 57.8 mg/dL   Total Protein 6.1  6.0 - 8.3 g/dL   Albumin 2.5 (*) 3.5 - 5.2 g/dL   AST 13  0 - 37 U/L   ALT 5  0 - 35 U/L   Alkaline Phosphatase 149 (*) 39 - 117 U/L   Total Bilirubin 0.4  0.3 - 1.2 mg/dL   GFR calc non Af Amer >90  >90 mL/min   GFR calc Af Amer >90  >90 mL/min  DIFFERENTIAL   Collection Time    10/25/12 12:05 AM      Result Value Range   Neutrophils Relative % 60  43 - 77 %   Neutro Abs 3.7  1.7 - 7.7 K/uL   Lymphocytes Relative 29  12 - 46 %   Lymphs Abs 1.8  0.7 - 4.0 K/uL   Monocytes Relative 10  3 - 12 %   Monocytes Absolute 0.6  0.1 - 1.0 K/uL   Eosinophils Relative 2  0 - 5 %   Eosinophils Absolute 0.1  0.0 - 0.7 K/uL   Basophils Relative 0  0 - 1 %   Basophils Absolute 0.0  0.0 - 0.1 K/uL  RAPID HIV SCREEN ([facility])   Collection Time    10/25/12 12:05 AM      Result Value Range   SUDS Rapid HIV Screen NON REACTIVE  NON REACTIVE  RPR   Collection Time    10/25/12 12:05 AM      Result Value Range   RPR NON REACTIVE  NON REACTIVE  TYPE AND SCREEN   Collection Time    10/25/12 12:05 AM      Result Value Range   ABO/RH(D) O POS     Antibody Screen NEG     Sample Expiration 10/28/2012    ABO/RH   Collection Time    10/25/12 12:05 AM      Result Value Range   ABO/RH(D) O POS     Pr/Cr ratio  Medications:  Scheduled . hydrALAZINE  10 mg Intramuscular Once  . potassium  chloride  40 mEq Oral Once  . prenatal multivitamin  1 tablet Oral Q1200  . ziprasidone  20 mg Oral BID WC   I have reviewed the patient's current medications.  ASSESSMENT: Patient Active Problem List   Diagnosis Date Noted  . Hypertension in pregnancy, antepartum 10/23/2012  . Schizophrenia, paranoid 10/10/2011  Previous c-section Stable labs, nml plt count and no evidence of HELLP.  Intermittent severe range BP.  [redacted] wks pregnant.  S/p BMZ.  Paranoid schizophrenia.  PLAN:  Continue IV Haldol, ? If can given Geodon IV.  IV ant-hypertensives. Continue Magnesium Sulfate.  Attempt transfer this am, since Magnesium Sulfate is on board.  BP is improved and pt. Is more cooperative.  Will need [place] PD to aide in transport as pt. Has IVC.   Mykah Bellomo S 10/25/2012,7:02 AM
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
## 2012-10-25 NOTE — Progress Notes (Addendum)
Pt calmer now. Security remains in attendance. Pt still lying on couch, will not get into bed. Pt argumentive. This note by B Erdy RN AC.

## 2012-10-25 NOTE — Progress Notes (Signed)
Rn spoke to [place] at [facility] center regarding transfer.  Report given.  Will transport via carelink to room 4170

## 2012-10-26 LAB — RUBELLA SCREEN: Rubella: 4.22 Index — ABNORMAL HIGH (ref <0.90)

## 2013-03-05 ENCOUNTER — Encounter (HOSPITAL_COMMUNITY): Payer: Self-pay | Admitting: Emergency Medicine

## 2013-03-05 ENCOUNTER — Emergency Department (HOSPITAL_COMMUNITY)
Admission: EM | Admit: 2013-03-05 | Discharge: 2013-03-06 | Disposition: A | Discharge status: Other Acute Inpatient Hospital | Payer: Federal, State, Local not specified - Other | Attending: Emergency Medicine | Admitting: Emergency Medicine

## 2013-03-05 DIAGNOSIS — R451 Restlessness and agitation: Secondary | ICD-10-CM

## 2013-03-05 DIAGNOSIS — F259 Schizoaffective disorder, unspecified: Secondary | ICD-10-CM

## 2013-03-05 DIAGNOSIS — IMO0002 Reserved for concepts with insufficient information to code with codable children: Secondary | ICD-10-CM | POA: Insufficient documentation

## 2013-03-05 DIAGNOSIS — F200 Paranoid schizophrenia: Secondary | ICD-10-CM

## 2013-03-05 DIAGNOSIS — F209 Schizophrenia, unspecified: Secondary | ICD-10-CM | POA: Insufficient documentation

## 2013-03-05 LAB — CBC WITH DIFFERENTIAL/PLATELET
Basophils Relative: 1 % (ref 0–1)
Eosinophils Absolute: 0.1 10*3/uL (ref 0.0–0.7)
Eosinophils Relative: 1 % (ref 0–5)
HCT: 40 % (ref 36.0–46.0)
Hemoglobin: 13.6 g/dL (ref 12.0–15.0)
MCH: 28.5 pg (ref 26.0–34.0)
MCHC: 34 g/dL (ref 30.0–36.0)
MCV: 83.9 fL (ref 78.0–100.0)
Monocytes Absolute: 0.3 10*3/uL (ref 0.1–1.0)
Monocytes Relative: 9 % (ref 3–12)
Neutro Abs: 1.6 10*3/uL — ABNORMAL LOW (ref 1.7–7.7)
RDW: 13.1 % (ref 11.5–15.5)

## 2013-03-05 LAB — COMPREHENSIVE METABOLIC PANEL
Albumin: 4.6 g/dL (ref 3.5–5.2)
BUN: 12 mg/dL (ref 6–23)
Calcium: 10 mg/dL (ref 8.4–10.5)
Chloride: 100 mEq/L (ref 96–112)
Creatinine, Ser: 0.67 mg/dL (ref 0.50–1.10)
Total Bilirubin: 0.8 mg/dL (ref 0.3–1.2)

## 2013-03-05 LAB — ETHANOL: Alcohol, Ethyl (B): <11 mg/dL (ref 0–11)

## 2013-03-05 MED ORDER — ZIPRASIDONE MESYLATE 20 MG IM SOLR
20.0000 mg | Freq: Once | INTRAMUSCULAR | Status: AC
  Administered 2013-03-05: 20 mg via INTRAMUSCULAR
  Filled 2013-03-05: qty 20

## 2013-03-05 NOTE — ED Notes (Signed)
Pt is awake and alert with a  flat affect. Patient is  uncooperative and not interested in treatment. Patient reports lower back pain/ movement. Patient refused physical assessment. Will continue to monitor for safety T.Melvyn Neth RN

## 2013-03-05 NOTE — ED Notes (Signed)
Pt belongings moved from locker #29 to locker #35.

## 2013-03-05 NOTE — ED Notes (Signed)
Pt brought in by [employer] under IVC for being danger to herself and others. Pt currently under doctor care and prescribed medications but not taking them on regular basis. Pt has poor eating, sleeping and hygiene habits.  Pt hearing voices and hallucinating and respondent is hostile and aggressive and thinks people are taking over her body.

## 2013-03-05 NOTE — ED Provider Notes (Signed)
CSN: [id number]     Arrival date & time 03/05/13  1359 History   First MD Initiated Contact with Patient 03/05/13 1412     Chief Complaint  Patient presents with  . Medical Clearance  . IVC     HPI  Patient presents to our emergency room in the [facility]. They were called to [facility], continue psychiatric facility red she been placed on an involuntary commitment by the staff there. She presented complaining of hallucinations. She was agitated and hostile. She was felt to represent a danger to herself or others. IVC was written. She was brought here in  [place] police custody. She will not speak  Past Medical History  Diagnosis Date  . Mental disorder   . Pregnant    Past Surgical History  Procedure Laterality Date  . Cesarean section  9 yrs ago    No family history on file. History  Substance Use Topics  . Smoking status: Never Smoker   . Smokeless tobacco: Not on file  . Alcohol Use: No   OB History   Grav Para Term Preterm Abortions TAB SAB Ect Mult Living   2 1 1       1      Review of Systems  Unable to perform ROS: Psychiatric disorder    Allergies  Review of patient's allergies indicates no known allergies.  Home Medications  No current outpatient prescriptions on file. BP 194/129  Pulse 117  Resp 20  SpO2 100% Physical Exam  Physical exam was performed on the patient being restrained by [facility].  Gen./constitutional: Agitated. Nonverbal.  HEENT:She has no nystagmus. HEENT shows no trauma. Pupils are 3 mm reactive symmetric. Neck freely mobile. Lungs clear Heart tachycardic at 110. No murmurs. Soft abdomen Neuro:  She moves all 4 extremities with good strength Psychiatric agitated Skin no wounds  ED Course  Procedures (including critical care time) Labs Review Labs Reviewed  CBC WITH DIFFERENTIAL  COMPREHENSIVE METABOLIC PANEL  ETHANOL  URINE RAPID DRUG SCREEN (HOSP PERFORMED)   Imaging Review No  results found.  EKG Interpretation   None       MDM   1. Schizophrenia   2. Agitation     I agree that her current conditions represents a danger to herself or others. She will undergo psychiatric evaluation. Labs and urine are pending for medical clearance.    Roney Marion, MD 03/05/13 1440

## 2013-03-05 NOTE — ED Notes (Signed)
One pt belonging bag containing soiled clothing, pants, underwear, socks, shoes, shirt and bra (gone through by triage tech) One grey trash bag containing blanket. Inside a floral duffle bag; Bag of socks Navy blue pants Pink underwear Lime green shirt Yellow shirt Red shirt Pink shirt Green tank top Red underwear Black sports bra Wash cloth Brown jacket Black ipod 2 combs and one brush Cell phone Soap  Toothbrush  Lotion   One black purse containing; Pictures radio Batteries Mail Head phones NO MONEY NO WALLET NO Cornelius ID  All belongings locked in locker 29

## 2013-03-05 NOTE — BH Assessment (Signed)
BHH Assessment Progress Note     This Clinical research associate called the nurses' station to set up tele assessment and was told patient had been given Geodon and was not able to participate in an assessment at this time.  Will pass on to incoming shift.

## 2013-03-06 ENCOUNTER — Inpatient Hospital Stay (HOSPITAL_COMMUNITY)
Admission: AD | Admit: 2013-03-06 | Discharge: 2013-03-13 | DRG: 885 | Disposition: A | Discharge status: Home / Self Care | Payer: Federal, State, Local not specified - Other | Source: Intra-hospital | Attending: Psychiatry | Admitting: Psychiatry

## 2013-03-06 ENCOUNTER — Encounter (HOSPITAL_COMMUNITY): Payer: Self-pay | Admitting: Registered Nurse

## 2013-03-06 DIAGNOSIS — Z5900 Homelessness unspecified: Secondary | ICD-10-CM

## 2013-03-06 DIAGNOSIS — O161 Unspecified maternal hypertension, first trimester: Secondary | ICD-10-CM

## 2013-03-06 DIAGNOSIS — F200 Paranoid schizophrenia: Principal | ICD-10-CM | POA: Diagnosis present

## 2013-03-06 DIAGNOSIS — F259 Schizoaffective disorder, unspecified: Secondary | ICD-10-CM

## 2013-03-06 DIAGNOSIS — IMO0002 Reserved for concepts with insufficient information to code with codable children: Secondary | ICD-10-CM

## 2013-03-06 MED ORDER — MAGNESIUM HYDROXIDE 400 MG/5ML PO SUSP: 30.0000 mL | Freq: Every day | ORAL | Status: DC | PRN

## 2013-03-06 MED ORDER — ARIPIPRAZOLE 10 MG PO TABS
10.0000 mg | ORAL_TABLET | Freq: Every day | ORAL | Status: DC
  Administered 2013-03-06: 10 mg via ORAL
  Filled 2013-03-06: qty 1

## 2013-03-06 MED ORDER — LORAZEPAM 1 MG PO TABS: 1.0000 mg | ORAL_TABLET | Freq: Once | ORAL | Status: DC | PRN

## 2013-03-06 MED ORDER — ZIPRASIDONE MESYLATE 20 MG IM SOLR
20.0000 mg | Freq: Once | INTRAMUSCULAR | Status: AC | PRN
  Filled 2013-03-06: qty 20

## 2013-03-06 MED ORDER — LORAZEPAM 2 MG/ML IJ SOLN
1.0000 mg | Freq: Three times a day (TID) | INTRAMUSCULAR | Status: DC | PRN
  Filled 2013-03-06: qty 1

## 2013-03-06 MED ORDER — ZIPRASIDONE HCL 20 MG PO CAPS: 20.0000 mg | ORAL_CAPSULE | Freq: Once | ORAL | Status: AC | PRN

## 2013-03-06 MED ORDER — ALUM & MAG HYDROXIDE-SIMETH 200-200-20 MG/5ML PO SUSP: 30.0000 mL | ORAL | Status: DC | PRN

## 2013-03-06 MED ORDER — ARIPIPRAZOLE 10 MG PO TABS
10.0000 mg | ORAL_TABLET | Freq: Every day | ORAL | Status: DC
  Administered 2013-03-07: 10 mg via ORAL
  Filled 2013-03-06 (×4): qty 1

## 2013-03-06 MED ORDER — ZIPRASIDONE HCL 20 MG PO CAPS
ORAL_CAPSULE | ORAL | Status: AC
  Administered 2013-03-06: 18:00:00
  Filled 2013-03-06: qty 1

## 2013-03-06 MED ORDER — ACETAMINOPHEN 325 MG PO TABS: 650.0000 mg | ORAL_TABLET | Freq: Four times a day (QID) | ORAL | Status: DC | PRN

## 2013-03-06 MED ORDER — LORAZEPAM 1 MG PO TABS: 1.0000 mg | ORAL_TABLET | Freq: Three times a day (TID) | ORAL | Status: DC | PRN

## 2013-03-06 MED ORDER — LORAZEPAM 2 MG/ML IJ SOLN
1.0000 mg | Freq: Once | INTRAMUSCULAR | Status: DC | PRN
Start: 2013-03-06 — End: 2013-03-06

## 2013-03-06 MED ORDER — LORAZEPAM 1 MG PO TABS
ORAL_TABLET | ORAL | Status: AC
  Administered 2013-03-06: 18:00:00
  Filled 2013-03-06: qty 1

## 2013-03-06 NOTE — Progress Notes (Signed)
Patient has been accepted to [facility] Bed 406-1.   The patient is IVC and the admission checklist was faxed to [facility].   Writer informed the ER MD (Dr. Effie Shy) and nurse working with the patient.

## 2013-03-06 NOTE — ED Notes (Signed)
Pt refused [name] tele-assessment, saying "I don't want your computers."
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
## 2013-03-06 NOTE — Progress Notes (Signed)
D   Pt has been in bed asleep  No distress noted   A   Will continue to monitor Q 15 min  R  Pt remains safe

## 2013-03-06 NOTE — Progress Notes (Signed)
Admission note: Pt refused to cooperate during admission process. Pt refused to answer questions and sign documents. Pt skin assessed by Clinical research associate. Pt refused to have any of her belongings given to her that was brought in on admission. Pt became agitated and labile during the admission process. Pt was offered ativan by Equities trader, pt refused to take med. Pt safely taken to the unit. Pt became verbally aggressive towards staff, cursing and threatening to throw her cup of water. 15 minute checks performed for safety. Pt safety maintained.

## 2013-03-06 NOTE — Progress Notes (Signed)
Attempted to assess patient at 10am this morning.  Provided patient with clean scrubs and pads.  Pt cussed and yelled at me to get out, when I walked out pt screamed again and slammed the door and threw scrubs/pads across the room. MD notified

## 2013-03-06 NOTE — ED Notes (Signed)
Pt refused to have her vital signs checked, saying "maybe after lunch."

## 2013-03-07 ENCOUNTER — Encounter (HOSPITAL_COMMUNITY): Payer: Self-pay

## 2013-03-07 DIAGNOSIS — Z8659 Personal history of other mental and behavioral disorders: Secondary | ICD-10-CM

## 2013-03-07 MED ORDER — DIVALPROEX SODIUM 500 MG PO DR TAB
500.0000 mg | DELAYED_RELEASE_TABLET | Freq: Two times a day (BID) | ORAL | Status: DC
  Administered 2013-03-07 – 2013-03-10 (×4): 500 mg via ORAL
  Filled 2013-03-07 (×10): qty 1

## 2013-03-07 MED ORDER — TRAZODONE HCL 50 MG PO TABS
50.0000 mg | ORAL_TABLET | Freq: Every evening | ORAL | Status: DC | PRN
  Filled 2013-03-07: qty 14

## 2013-03-07 MED ORDER — OLANZAPINE 10 MG PO TBDP: 10.0000 mg | ORAL_TABLET | Freq: Three times a day (TID) | ORAL | Status: DC | PRN

## 2013-03-07 NOTE — BHH Group Notes (Signed)
[place]  [facility] LCSW Group Therapy  03/07/2013 2:17 PM  Type of Therapy:  Group therapy   Participation Level:  Did not attend   Holly Hines 03/07/2013, 2:17 PM
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
## 2013-03-07 NOTE — BHH Group Notes (Signed)
[facility] LCSW Aftercare Discharge Planning Group Note   03/07/2013 7:57 AM  Participation Quality:  Did not attend    [place]

## 2013-03-07 NOTE — Progress Notes (Signed)
Adult Psychoeducational Group Note  Date:  03/07/2013 Time:  11:00AM Group Topic/Focus:  personal development  Participation Level:  Did Not Attend    Additional Comments:  Pt didn't attend group.   Bing Plume D 03/07/2013, 2:59 PM

## 2013-03-07 NOTE — Tx Team (Signed)
Initial Interdisciplinary Treatment Plan  PATIENT STRENGTHS: (choose at least two) General fund of knowledge Supportive family/friends  PATIENT STRESSORS: Medication change or noncompliance   PROBLEM LIST: Problem List/Patient Goals Date to be addressed Date deferred Reason deferred Estimated date of resolution  Psychotic symptoms                                                       DISCHARGE CRITERIA:  Improved stabilization in mood, thinking, and/or behavior Reduction of life-threatening or endangering symptoms to within safe limits Verbal commitment to aftercare and medication compliance  PRELIMINARY DISCHARGE PLAN: Attend aftercare/continuing care group Placement in alternative living arrangements  PATIENT/FAMIILY INVOLVEMENT: This treatment plan has been presented to and reviewed with the patient, Holly Hines, and/or family member, .  The patient and family have been given the opportunity to ask questions and make suggestions.  Andrena Mews 03/07/2013, 5:54 AM

## 2013-03-07 NOTE — Progress Notes (Signed)
D: Pt easily agitated this morning and labile. When offered scheduled med this am, pt yelled at writer and stated, "bring me my pill like you did yesterday and get out". Pt compliant with taking morning med. Pt refused to have her v/s taken. MD made aware. Pt refuses to cooperate with Clinical research associate and refuses to answer any questions. A: Medications administered as ordered per MD. Verbal support given. Pt encouraged to attend groups. 15 minute checks performed for safety. R: Pt safety maintained.

## 2013-03-07 NOTE — [facility] Group Notes (Signed)
Adult Psychoeducational Group Note  Date:  03/07/2013 Time:  9:12 PM  Group Topic/Focus:  Wrap-Up Group:   The focus of this group is to help patients review their daily goal of treatment and discuss progress on daily workbooks.  Participation Level:  Did Not Attend  Participation Quality:  None  Affect:  None  Cognitive:  None  Insight: None  Engagement in Group:  None  Modes of Intervention:  Discussion  Additional Comments:  Panzy did not attend group.  Caroll Rancher A 03/07/2013, 9:12 PM

## 2013-03-07 NOTE — Tx Team (Signed)
Initial Interdisciplinary Treatment Plan  PATIENT STRENGTHS: (choose at least two) Supportive family/friends  PATIENT STRESSORS: Marital or family conflict   PROBLEM LIST: Problem List/Patient Goals Date to be addressed Date deferred Reason deferred Estimated date of resolution  Paranoia  03/07/13     Psychosis  03/07/13                                                DISCHARGE CRITERIA:  Ability to meet basic life and health needs Improved stabilization in mood, thinking, and/or behavior Motivation to continue treatment in a less acute level of care Verbal commitment to aftercare and medication compliance  PRELIMINARY DISCHARGE PLAN: Attend aftercare/continuing care group Attend PHP/IOP  PATIENT/FAMIILY INVOLVEMENT: This treatment plan has been presented to and reviewed with the patient, Holly Hines, and/or family member.  The patient and family have been given the opportunity to ask questions and make suggestions.  [name] L 03/07/2013, 10:20 AM

## 2013-03-07 NOTE — Progress Notes (Signed)
Recreation Therapy Notes  Date: 11.12.2014 Time: 9:30am Location: [address] Dayroom  Group Topic: Leisure Education  Goal Area(s) Addresses:  Patient will identify positive leisure/recreation activities. Patient will identify benefit of positive leisure and recreation.   Behavioral Response: Did not attend  Jearl Klinefelter, LRT/CTRS  Jearl Klinefelter 03/07/2013 4:49 PM

## 2013-03-07 NOTE — Consult Note (Signed)
Note reviewed and agreed with  
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
## 2013-03-07 NOTE — Progress Notes (Signed)
Patient ID: Holly Hines, female   DOB: February 19, 1971, 42 y.o.   MRN: [id number] D: Pt is awake and active on the unit this PM. Pt endorses auditory hallucinations but denies SI. Pt mood is irritable/angry and her affect is blunted. Pt is verbally aggressive and defensive but ultimately cooperative with staff. Pt angrily stated that she did not need her Depakote, but eventually agreed to take it. She also expressed a desire to attend dinner in the cafeteria, so she was allowed to go and did well. Pt remains isolative and withdrawn otherwise. She forwards little to staff.  A: Encouraged pt to discuss feelings with staff and administered medication per MD orders. Writer also encouraged pt to participate in groups.  R: Pt is attending groups and tolerating medications well. Writer will continue to monitor. 15 minute checks are ongoing for safety.

## 2013-03-07 NOTE — [facility] Suicide Risk Assessment (Signed)
Suicide Risk Assessment  Admission Assessment     Nursing information obtained from:  Review of record Demographic factors:  Low socioeconomic status;Unemployed Current Mental Status:  Suicidal ideation indicated by others;Self-harm behaviors Loss Factors:  Financial problems / change in socioeconomic status Historical Factors:  Prior suicide attempts Risk Reduction Factors:  Responsible for children under 41 years of age  CLINICAL FACTORS:   Severe Anxiety and/or Agitation Bipolar Disorder: manic  phase Currently Psychotic  COGNITIVE FEATURES THAT CONTRIBUTE TO RISK:  Closed-mindedness Polarized thinking    SUICIDE RISK:   Minimal: No identifiable suicidal ideation.  Patients presenting with no risk factors but with morbid ruminations; may be classified as minimal risk based on the severity of the depressive symptoms  PLAN OF CARE:1. Admit for crisis management and stabilization. 2. Medication management to reduce current symptoms to base line and improve the     patient's overall level of functioning 3. Treat health problems as indicated. 4. Develop treatment plan to decrease risk of relapse upon discharge and the need for     readmission. 5. Psycho-social education regarding relapse prevention and self care. 6. Health care follow up as needed for medical problems. 7. Restart home medications where appropriate.   I certify that inpatient services furnished can reasonably be expected to improve the patient's condition.  Thedore Mins, MD 03/07/2013, 11:22 AM

## 2013-03-08 MED ORDER — HALOPERIDOL 5 MG PO TABS
5.0000 mg | ORAL_TABLET | Freq: Two times a day (BID) | ORAL | Status: DC
  Filled 2013-03-08 (×4): qty 1

## 2013-03-08 MED ORDER — DIPHENHYDRAMINE HCL 50 MG/ML IJ SOLN
25.0000 mg | Freq: Two times a day (BID) | INTRAMUSCULAR | Status: DC
  Filled 2013-03-08 (×4): qty 0.5

## 2013-03-08 MED ORDER — BENZTROPINE MESYLATE 0.5 MG PO TABS
0.5000 mg | ORAL_TABLET | Freq: Two times a day (BID) | ORAL | Status: DC
  Filled 2013-03-08 (×4): qty 1

## 2013-03-08 MED ORDER — HALOPERIDOL 5 MG PO TABS
5.0000 mg | ORAL_TABLET | Freq: Two times a day (BID) | ORAL | Status: DC
  Administered 2013-03-08 – 2013-03-10 (×5): 5 mg via ORAL
  Filled 2013-03-08 (×8): qty 1

## 2013-03-08 MED ORDER — HALOPERIDOL LACTATE 5 MG/ML IJ SOLN
5.0000 mg | Freq: Two times a day (BID) | INTRAMUSCULAR | Status: DC
  Filled 2013-03-08 (×4): qty 1

## 2013-03-08 MED ORDER — BENZTROPINE MESYLATE 1 MG PO TABS
1.0000 mg | ORAL_TABLET | Freq: Two times a day (BID) | ORAL | Status: DC
  Administered 2013-03-08 – 2013-03-12 (×8): 1 mg via ORAL
  Filled 2013-03-08 (×10): qty 1

## 2013-03-08 MED ORDER — BENZTROPINE MESYLATE 1 MG/ML IJ SOLN
1.0000 mg | Freq: Two times a day (BID) | INTRAMUSCULAR | Status: DC
  Filled 2013-03-08 (×10): qty 1

## 2013-03-08 MED ORDER — HALOPERIDOL LACTATE 5 MG/ML IJ SOLN
5.0000 mg | Freq: Two times a day (BID) | INTRAMUSCULAR | Status: DC
  Filled 2013-03-08 (×8): qty 1

## 2013-03-08 NOTE — Progress Notes (Signed)
Patient resting quietly with eyes closed. Respirations even and unlabored. No distress noted, Q 15 minute check continues as ordered to maintain safety.  

## 2013-03-08 NOTE — Progress Notes (Signed)
Patient resting quietly with eyes closed. She received medication from day RN right before shift change and she has sleeping since then. Writer checked on patient couple of time. Her respirations even and unlabored; No distress noted,Q 15 minute check continues as ordered to maintain safety.

## 2013-03-08 NOTE — Progress Notes (Signed)
D:  Patient has been in her room and refuses to come out for groups or meds.  She would not take medications this morning and would not engage in conversation with me.  It is reported that when her breakfast tray was brought down, she threw the coffee.  When Dr. Jannifer Franklin went to the room she was very loud and demanded to be discharged today.  She has refused all medication thus far.  She ripped up her self inventory form and threw it on the floor.   A:  Introduced myself to patient and offered support and encouragement.  Gently advised her that she would not be going home today.   R:  Not interacting with staff or peers.  Not attending groups.  Angry and irritable.  Safety is maintained with 15 minute checks.

## 2013-03-08 NOTE — Progress Notes (Signed)
[facility] MD Progress Note  03/08/2013 11:20 AM Holly Hines  MRN:  [id number] Subjective: "Get me out of here, I don't belong here." Objective: Patient who is uncooperative with her care, she is easily agitated, irritable and labile. She continues to refuse her medications saying that she does not need it . However, she is psychotic and delusion, saying that she is seeing people raping her in her room. Her behavior is bizarre and disorganized. Diagnosis:   DSM5: Schizophrenia Disorders:  Delusional Disorder (297.1) and Brief Psychotic Disorder (298.8) Obsessive-Compulsive Disorders:   Trauma-Stressor Disorders:   Substance/Addictive Disorders:  Patient refused urine toxicology Depressive Disorders:  Disruptive Mood Dysregulation Disorder (296.99)  Axis I: Chronic Paranoid Schizophrenia  ADL's:  Impaired  Sleep: Fair  Appetite:  Fair  Suicidal Ideation: yes Plan:  denies Intent:  denies Means:  denies Homicidal Ideation:  Plan:  denies Intent:  denies Means:  denies AEB (as evidenced by):  Psychiatric Specialty Exam: Review of Systems  Constitutional: Negative.   HENT: Negative.   Eyes: Negative.   Respiratory: Negative.   Cardiovascular: Negative.   Gastrointestinal: Negative.   Genitourinary: Negative.   Musculoskeletal: Negative.   Skin: Negative.   Neurological: Negative.   Endo/Heme/Allergies: Negative.   Psychiatric/Behavioral: Positive for hallucinations.    There were no vitals taken for this visit.There is no weight on file to calculate BMI.  General Appearance: Disheveled  Eye Contact::  Poor  Speech:  Pressured  Volume:  Increased  Mood:  Angry and Irritable  Affect:  Labile and Full Range  Thought Process:  Disorganized  Orientation:  Full (Time, Place, and Person)  Thought Content:  Delusions and Hallucinations: Auditory  Suicidal Thoughts:  Yes.  without intent/plan  Homicidal Thoughts:  No  Memory:  Immediate;   Fair Recent;   Fair Remote;    Fair  Judgement:  Poor  Insight:  Lacking  Psychomotor Activity:  Increased  Concentration:  Poor  Recall:  Fair  Akathisia:  No  Handed:  Right  AIMS (if indicated):     Assets:  Physical Health  Sleep:  Number of Hours: 6.75   Current Medications: Current Facility-Administered Medications  Medication Dose Route Frequency Provider Last Rate Last Dose  . acetaminophen (TYLENOL) tablet 650 mg  650 mg Oral Q6H PRN Shuvon Rankin, NP      . alum & mag hydroxide-simeth (MAALOX/MYLANTA) 200-200-20 MG/5ML suspension 30 mL  30 mL Oral Q4H PRN Shuvon Rankin, NP      . benztropine (COGENTIN) tablet 0.5 mg  0.5 mg Oral BID Ladesha Pacini      . diphenhydrAMINE (BENADRYL) injection 25 mg  25 mg Intramuscular BID PC Jakiah Goree      . divalproex (DEPAKOTE) DR tablet 500 mg  500 mg Oral BID PC Jsoeph Podesta   500 mg at 03/07/13 1800  . haloperidol (HALDOL) tablet 5 mg  5 mg Oral BID Treavor Blomquist      . haloperidol lactate (HALDOL) injection 5 mg  5 mg Intramuscular BID PC Niylah Hassan      . LORazepam (ATIVAN) tablet 1 mg  1 mg Oral Q8H PRN Shuvon Rankin, NP       Or  . LORazepam (ATIVAN) injection 1 mg  1 mg Intramuscular Q8H PRN Shuvon Rankin, NP      . magnesium hydroxide (MILK OF MAGNESIA) suspension 30 mL  30 mL Oral Daily PRN Shuvon Rankin, NP      . OLANZapine zydis (ZYPREXA) disintegrating tablet 10 mg  10  mg Oral Q8H PRN Azaylah Stailey      . traZODone (DESYREL) tablet 50 mg  50 mg Oral QHS PRN Fransisca Kaufmann, NP        Lab Results: No results found for this or any previous visit (from the past 48 hour(s)).  Physical Findings: AIMS: Facial and Oral Movements Muscles of Facial Expression: None, normal (none observed  pt uncooperative) Lips and Perioral Area: None, normal Jaw: None, normal Tongue: None, normal,Extremity Movements Upper (arms, wrists, hands, fingers): None, normal Lower (legs, knees, ankles, toes): None, normal, Trunk Movements Neck, shoulders, hips: None,  normal, Overall Severity Severity of abnormal movements (highest score from questions above): None, normal Incapacitation due to abnormal movements: None, normal Patient's awareness of abnormal movements (rate only patient's report): No Awareness, Dental Status Current problems with teeth and/or dentures?: No Does patient usually wear dentures?: No  CIWA:    COWS:     Treatment Plan Summary: Daily contact with patient to assess and evaluate symptoms and progress in treatment Medication management  Plan:1. Admit for crisis management and stabilization. 2. Medication management to reduce current symptoms to base line and improve the     patient's overall level of functioning 3. Treat health problems as indicated. 4. Develop treatment plan to decrease risk of relapse upon discharge and the need for     readmission. 5. Psycho-social education regarding relapse prevention and self care. 6. Health care follow up as needed for medical problems. 7. Restart home medications where appropriate. 8. Will asked Dr. Dub Mikes to assess patient for forced medication administration. Medical Decision Making Problem Points:  Established problem, worsening (2), Review of last therapy session (1) and Review of psycho-social stressors (1) Data Points:  Order Aims Assessment (2) Review of medication regiment & side effects (2) Review of new medications or change in dosage (2)  I certify that inpatient services furnished can reasonably be expected to improve the patient's condition.   Thedore Mins, MD 03/08/2013, 11:20 AM

## 2013-03-08 NOTE — Progress Notes (Signed)
Patient was once again offered her scheduled medications.  She was advised that there was a forced medication order on her chart.  She was given the choice of taking PO medications or receiving them IM.  She did finally agree to take the oral medications.  She did take the medicine and threw some water around the room.  She also spit on the wall next to her bed.  She continues to be loud and verbally abusive to staff.

## 2013-03-08 NOTE — [facility] Group Notes (Signed)
[facility] Group Notes:  (Counselor/Nursing/MHT/Case Management/Adjunct)  03/08/2013 1:15PM  Type of Therapy:  Group Therapy  Participation Level:  Did not attend    Summary of Progress/Problems: The topic for group was balance in life.  Pt participated in the discussion about when their life was in balance and out of balance and how this feels.  Pt discussed ways to get back in balance and short term goals they can work on to get where they want to be.    Daryel Gerald B 03/08/2013 1:56 PM

## 2013-03-09 DIAGNOSIS — F22 Delusional disorders: Secondary | ICD-10-CM

## 2013-03-09 DIAGNOSIS — F39 Unspecified mood [affective] disorder: Secondary | ICD-10-CM

## 2013-03-09 DIAGNOSIS — F23 Brief psychotic disorder: Secondary | ICD-10-CM

## 2013-03-09 DIAGNOSIS — F200 Paranoid schizophrenia: Secondary | ICD-10-CM

## 2013-03-09 NOTE — Clinical Social Work Note (Signed)
Unable to conduct PSA due to noncooperation of pt.

## 2013-03-09 NOTE — [facility] Group Notes (Signed)
[facility] LCSW Aftercare Discharge Planning Group Note   03/09/2013 10:56 AM  Participation Quality:  Did not attend    [place]

## 2013-03-09 NOTE — Progress Notes (Signed)
Pt refused to participate in giving her morning vitals.

## 2013-03-09 NOTE — [facility] Group Notes (Signed)
[facility] LCSW Group Therapy  03/09/2013 2:46 PM  Type of Therapy:  Group Therapy   Participation Level:  Did not attend.    Summary of Progress/Problems:  Chaplain was here to lead a group on themes of hope and courage.   Holly Hines   03/09/2013  2:46 PM

## 2013-03-09 NOTE — Progress Notes (Signed)
Patient ID: Holly Hines, female   DOB: 02-06-1971, 42 y.o.   MRN: [id number] [facility] MD Progress Note  03/09/2013 11:42 AM Holly Hines  MRN:  [id number] Subjective: Patient states "Are you bringing my release papers? I didn't come here for any treatment." The patient declined to respond to any of writer's questions.   Objective: Patient who is uncooperative with her care, she is easily agitated, irritable and labile. She has been refusing to cooperate with her treatment such as attending groups or allowing vitals to be taken. Patient has become compliant with oral haldol after being informed by nursing staff of forced medication order. However, patient continues to be resistant to care believing that she is ready to be discharged.   Diagnosis:   DSM5: Schizophrenia Disorders:  Delusional Disorder (297.1) and Brief Psychotic Disorder (298.8) Obsessive-Compulsive Disorders:   Trauma-Stressor Disorders:   Substance/Addictive Disorders:  Patient refused urine toxicology Depressive Disorders:  Disruptive Mood Dysregulation Disorder (296.99)  Axis I: Chronic Paranoid Schizophrenia  ADL's:  Impaired  Sleep: Fair  Appetite:  Fair  Suicidal Ideation: yes Plan:  denies Intent:  denies Means:  denies Homicidal Ideation:  Plan:  denies Intent:  denies Means:  denies AEB (as evidenced by):  Psychiatric Specialty Exam: Review of Systems  Constitutional: Negative.   HENT: Negative.   Eyes: Negative.   Respiratory: Negative.   Cardiovascular: Negative.   Gastrointestinal: Negative.   Genitourinary: Negative.   Musculoskeletal: Negative.   Skin: Negative.   Neurological: Negative.   Endo/Heme/Allergies: Negative.   Psychiatric/Behavioral: Positive for depression and hallucinations. The patient is nervous/anxious.     Temperature 0 F (-17.8 C).There is no weight on file to calculate BMI.  General Appearance: Disheveled  Eye Contact::  Poor  Speech:  Pressured  Volume:   Increased  Mood:  Angry and Irritable  Affect:  Labile and Full Range  Thought Process:  Disorganized  Orientation:  Full (Time, Place, and Person)  Thought Content:  Delusions and Hallucinations: Auditory  Suicidal Thoughts:  Yes.  without intent/plan  Homicidal Thoughts:  No  Memory:  Immediate;   Fair Recent;   Fair Remote;   Fair  Judgement:  Poor  Insight:  Lacking  Psychomotor Activity:  Increased  Concentration:  Poor  Recall:  Fair  Akathisia:  No  Handed:  Right  AIMS (if indicated):     Assets:  Physical Health  Sleep:  Number of Hours: 6.75   Current Medications: Current Facility-Administered Medications  Medication Dose Route Frequency Provider Last Rate Last Dose  . acetaminophen (TYLENOL) tablet 650 mg  650 mg Oral Q6H PRN Shuvon Rankin, NP      . alum & mag hydroxide-simeth (MAALOX/MYLANTA) 200-200-20 MG/5ML suspension 30 mL  30 mL Oral Q4H PRN Shuvon Rankin, NP      . benztropine (COGENTIN) tablet 1 mg  1 mg Oral BID Fransisca Kaufmann, NP   1 mg at 03/09/13 0801   Or  . benztropine mesylate (COGENTIN) injection 1 mg  1 mg Intramuscular BID Fransisca Kaufmann, NP      . divalproex (DEPAKOTE) DR tablet 500 mg  500 mg Oral BID PC Mojeed Akintayo   500 mg at 03/09/13 1002  . haloperidol (HALDOL) tablet 5 mg  5 mg Oral BID Fransisca Kaufmann, NP   5 mg at 03/09/13 6962   Or  . haloperidol lactate (HALDOL) injection 5 mg  5 mg Intramuscular BID Fransisca Kaufmann, NP      . LORazepam (ATIVAN) tablet 1 mg  1 mg Oral Q8H PRN Shuvon Rankin, NP       Or  . LORazepam (ATIVAN) injection 1 mg  1 mg Intramuscular Q8H PRN Shuvon Rankin, NP      . magnesium hydroxide (MILK OF MAGNESIA) suspension 30 mL  30 mL Oral Daily PRN Shuvon Rankin, NP      . OLANZapine zydis (ZYPREXA) disintegrating tablet 10 mg  10 mg Oral Q8H PRN Mojeed Akintayo      . traZODone (DESYREL) tablet 50 mg  50 mg Oral QHS PRN Fransisca Kaufmann, NP        Lab Results: No results found for this or any previous visit (from the past 48  hour(s)).  Physical Findings: AIMS: Facial and Oral Movements Muscles of Facial Expression: None, normal (none observed  pt uncooperative) Lips and Perioral Area: None, normal Jaw: None, normal Tongue: None, normal,Extremity Movements Upper (arms, wrists, hands, fingers): None, normal Lower (legs, knees, ankles, toes): None, normal, Trunk Movements Neck, shoulders, hips: None, normal, Overall Severity Severity of abnormal movements (highest score from questions above): None, normal Incapacitation due to abnormal movements: None, normal Patient's awareness of abnormal movements (rate only patient's report): No Awareness, Dental Status Current problems with teeth and/or dentures?: No Does patient usually wear dentures?: No  CIWA:    COWS:     Treatment Plan Summary: Daily contact with patient to assess and evaluate symptoms and progress in treatment Medication management  Plan:1. Continue crisis management and stabilization. 2. Medication management to reduce current symptoms to base line and improve the patient's overall level of functioning 3. Treat health problems as indicated. 4. Develop treatment plan to decrease risk of relapse upon discharge and the need for  readmission. 5. Psycho-social education regarding relapse prevention and self care. 6. Health care follow up as needed for medical problems. 7. Restart home medications where appropriate. 8. Second opinion obtained and forced medications order in place. Patient ordered to receive Haldol 5 mg, Cogentin 1 mg po or IM twice daily.    Medical Decision Making Problem Points:  Established problem, worsening (2), Review of last therapy session (1) and Review of psycho-social stressors (1) Data Points:  Order Aims Assessment (2) Review of medication regiment & side effects (2) Review of new medications or change in dosage (2)  I certify that inpatient services furnished can reasonably be expected to improve the patient's  condition.   Fransisca Kaufmann, NP-C 03/09/2013, 11:42 AM

## 2013-03-09 NOTE — Progress Notes (Addendum)
Patient ID: Holly Hines, female   DOB: 12/26/1970, 42 y.o.   MRN: [id number] 03-09-13 nursing shift note: D: this pt continues to be argumentative and verbally abusive to staff. She will not come to the medication window. She refused to go to the cafeteria.   A: medications have been taken to the patient in her room, as well as meals. She is taking po medication with a great deal of encouragement. R: she insist she is ready to "go home". On her inventory sheet she wrote: slept fair, appetite good, energy low, attention good with her depression and hopelessness both at "1". She denies any SI and remains delusional and has no insight. She has a forced medication order. RN will continue to monitor and Q 15 min ck's continue.

## 2013-03-09 NOTE — Progress Notes (Signed)
Recreation Therapy Notes  Date: 11.14.2014 Time: 9:30am Location: [address] Dayroom  Group Topic: Self-Expression  Goal Area(s) Addresses:  Patient will identify use art as a means of self-expression.  Patient will identify why they chose to draw what they did.  Patient will identify benefit of using self-expression.   Behavioral Response: Did not attend  Jearl Klinefelter, LRT/CTRS  Jearl Klinefelter 03/09/2013 12:43 PM

## 2013-03-10 DIAGNOSIS — R45851 Suicidal ideations: Secondary | ICD-10-CM

## 2013-03-10 MED ORDER — HALOPERIDOL LACTATE 5 MG/ML IJ SOLN
5.0000 mg | INTRAMUSCULAR | Status: DC
  Filled 2013-03-10 (×3): qty 1

## 2013-03-10 MED ORDER — HALOPERIDOL LACTATE 5 MG/ML IJ SOLN
7.0000 mg | Freq: Every day | INTRAMUSCULAR | Status: DC
  Filled 2013-03-10 (×3): qty 1.4

## 2013-03-10 MED ORDER — DIVALPROEX SODIUM 500 MG PO DR TAB
500.0000 mg | DELAYED_RELEASE_TABLET | ORAL | Status: DC
  Administered 2013-03-12: 500 mg via ORAL
  Filled 2013-03-10 (×3): qty 1

## 2013-03-10 MED ORDER — DIVALPROEX SODIUM 500 MG PO DR TAB
750.0000 mg | DELAYED_RELEASE_TABLET | Freq: Every day | ORAL | Status: DC
  Administered 2013-03-11: 750 mg via ORAL
  Filled 2013-03-10 (×5): qty 1

## 2013-03-10 MED ORDER — HALOPERIDOL 5 MG PO TABS
7.0000 mg | ORAL_TABLET | Freq: Every day | ORAL | Status: DC
  Administered 2013-03-11: 21:00:00 7 mg via ORAL
  Filled 2013-03-10 (×3): qty 1

## 2013-03-10 MED ORDER — HALOPERIDOL 5 MG PO TABS
5.0000 mg | ORAL_TABLET | ORAL | Status: DC
  Administered 2013-03-11 – 2013-03-12 (×2): 5 mg via ORAL
  Filled 2013-03-10 (×3): qty 1

## 2013-03-10 NOTE — Progress Notes (Signed)
Adult Psychoeducational Group Note  Date:  03/10/2013 Time:  10:04 PM  Group Topic/Focus:  Wrap-Up Group:   The focus of this group is to help patients review their daily goal of treatment and discuss progress on daily workbooks.  Participation Level:  None  Participation Quality:  Inattentive  Affect:  Defensive  Cognitive:  Appropriate  Insight: None  Engagement in Group:  Lacking  Modes of Intervention:  Discussion  Additional Comments: The patient seems to not accept our program. The patient seemed defensive when asked questions.  Octavio Manns 03/10/2013, 10:04 PM

## 2013-03-10 NOTE — Progress Notes (Signed)
Patient ID: Holly Hines, female   DOB: 18-Jul-1970, 42 y.o.   MRN: [id number] [facility] MD Progress Note  03/10/2013 6:24 PM Yitta Gongaware  MRN:  [id number]  Subjective: Attempted to meet with patient who continues to be rude and hostile with the staff. She is in bed with the covers over her head stating that all she came here for was to be released?   Objective: Patient who is uncooperative with her care, she is easily agitated, irritable and labile.   Diagnosis:   DSM5: Schizophrenia Disorders:  Delusional Disorder (297.1) and Brief Psychotic Disorder (298.8) Obsessive-Compulsive Disorders:   Trauma-Stressor Disorders:   Substance/Addictive Disorders:  Patient refused urine toxicology Depressive Disorders:  Disruptive Mood Dysregulation Disorder (296.99)  Axis I: Chronic Paranoid Schizophrenia  ADL's:  Impaired  Sleep: Fair  Appetite:  Fair  Suicidal Ideation: yes Plan:  denies Intent:  denies Means:  denies Homicidal Ideation:  Plan:  denies Intent:  denies Means:  denies AEB (as evidenced by):  Psychiatric Specialty Exam: Review of Systems  Constitutional: Negative.   HENT: Negative.   Eyes: Negative.   Respiratory: Negative.   Cardiovascular: Negative.   Gastrointestinal: Negative.   Genitourinary: Negative.   Musculoskeletal: Negative.   Skin: Negative.   Neurological: Negative.   Endo/Heme/Allergies: Negative.   Psychiatric/Behavioral: Positive for depression and hallucinations. The patient is nervous/anxious.     Temperature 0 F (-17.8 C), height 0' (0 m), weight 0 kg (0 lb).Cannot calculate BMI with a height equal to zero.  General Appearance: Disheveled  Eye Contact::  Poor  Speech:  Pressured  Volume:  Increased  Mood:  Angry and Irritable  Affect:  Labile and Full Range  Thought Process:  Disorganized  Orientation:  Full (Time, Place, and Person)  Thought Content:  Delusions and Hallucinations: Auditory  Suicidal Thoughts:  Yes.  without  intent/plan  Homicidal Thoughts:  No  Memory:  Immediate;   Fair Recent;   Fair Remote;   Fair  Judgement:  Poor  Insight:  Lacking  Psychomotor Activity:  Increased  Concentration:  Poor  Recall:  Fair  Akathisia:  No  Handed:  Right  AIMS (if indicated):     Assets:  Physical Health  Sleep:  Number of Hours: 6.75   Current Medications: Current Facility-Administered Medications  Medication Dose Route Frequency Provider Last Rate Last Dose  . acetaminophen (TYLENOL) tablet 650 mg  650 mg Oral Q6H PRN Shuvon Rankin, NP      . alum & mag hydroxide-simeth (MAALOX/MYLANTA) 200-200-20 MG/5ML suspension 30 mL  30 mL Oral Q4H PRN Shuvon Rankin, NP      . benztropine (COGENTIN) tablet 1 mg  1 mg Oral BID Fransisca Kaufmann, NP   1 mg at 03/10/13 1706   Or  . benztropine mesylate (COGENTIN) injection 1 mg  1 mg Intramuscular BID Fransisca Kaufmann, NP      . divalproex (DEPAKOTE) DR tablet 500 mg  500 mg Oral BID PC Mojeed Akintayo   500 mg at 03/10/13 1209  . haloperidol (HALDOL) tablet 5 mg  5 mg Oral BID Fransisca Kaufmann, NP   5 mg at 03/10/13 1706   Or  . haloperidol lactate (HALDOL) injection 5 mg  5 mg Intramuscular BID Fransisca Kaufmann, NP      . LORazepam (ATIVAN) tablet 1 mg  1 mg Oral Q8H PRN Shuvon Rankin, NP       Or  . LORazepam (ATIVAN) injection 1 mg  1 mg Intramuscular Q8H PRN Shuvon Rankin, NP      .  magnesium hydroxide (MILK OF MAGNESIA) suspension 30 mL  30 mL Oral Daily PRN Shuvon Rankin, NP      . OLANZapine zydis (ZYPREXA) disintegrating tablet 10 mg  10 mg Oral Q8H PRN Mojeed Akintayo      . traZODone (DESYREL) tablet 50 mg  50 mg Oral QHS PRN Fransisca Kaufmann, NP        Lab Results: No results found for this or any previous visit (from the past 48 hour(s)).  Physical Findings: AIMS: Facial and Oral Movements Muscles of Facial Expression: None, normal (none observed  pt uncooperative) Lips and Perioral Area: None, normal Jaw: None, normal Tongue: None, normal,Extremity Movements Upper  (arms, wrists, hands, fingers): None, normal Lower (legs, knees, ankles, toes): None, normal, Trunk Movements Neck, shoulders, hips: None, normal, Overall Severity Severity of abnormal movements (highest score from questions above): None, normal Incapacitation due to abnormal movements: None, normal Patient's awareness of abnormal movements (rate only patient's report): No Awareness, Dental Status Current problems with teeth and/or dentures?: No Does patient usually wear dentures?: No  CIWA:    COWS:     Treatment Plan Summary: Daily contact with patient to assess and evaluate symptoms and progress in treatment Medication management  Plan: 1. Patient is seen and chart is reviewed. 2. Will increase pm Haldol dose to 7mg  at HS and continue 5mg  each AM. 3. Will also increase pm dose of Depakote to 750mg  at hs and continue 500mg  in the AM. 4. Will follow.   Medical Decision Making Problem Points:  Established problem, worsening (2), Review of last therapy session (1) and Review of psycho-social stressors (1) Data Points:  Order Aims Assessment (2) Review of medication regiment & side effects (2) Review of new medications or change in dosage (2)  I certify that inpatient services furnished can reasonably be expected to improve the patient's condition.   Rona Ravens. Mashburn RPAC 6:29 PM 03/10/2013  Reviewed the information documented and agree with the treatment plan.  Dawnna Gritz,JANARDHAHA R. 03/12/2013 8:32 AM

## 2013-03-10 NOTE — Progress Notes (Signed)
Patient ID: Holly Hines, female   DOB: [birth year], 42 y.o.   MRN: [id number] 03-10-13 nursing shift note: D: pt continues to be obstinate and uncooperative. She is verbally aggressive with staff and is "gamey" when taking her medications. She is not going to groups and is staying in her room most of the day. A: staff continues to work with this patient encouraging her and praising her.staff checks her mouth after administration of all medications. R: she is still verbally abusive and uncooperative. She is refusing her v/s and will not engage with staff. RN will monitor and Q 15 min ck's continue.

## 2013-03-10 NOTE — Progress Notes (Signed)
Patient ID: Holly Hines, female   DOBPsychoeducational Group Note  Date:  03/10/2013 Time:0930am  Group Topic/Focus:  Identifying Needs:   The focus of this group is to help patients identify their personal needs that have been historically problematic and identify healthy behaviors to address their needs.  Participation Level:  Did Not Attend  Participation Quality:    Affect:  Cognitive:  Insight: Engagement in Group: Additional Comments:  Inventory and Psychoeducational group   [name] 03/10/2013,10:11 AM: [birth year], 42 y.o.   MRN: [id number]

## 2013-03-10 NOTE — [facility] Group Notes (Signed)
[facility] Group Notes: (Clinical Social Work)   03/10/2013      Type of Therapy:  Group Therapy   Participation Level:  Did Not Attend    Ambrose Mantle, LCSW 03/10/2013, 12:56 PM

## 2013-03-10 NOTE — Progress Notes (Signed)
Pt did not attend wrap up group this evening.  

## 2013-03-10 NOTE — Progress Notes (Signed)
Patient ID: Holly Hines, female   DOB: 02-Jun-1970, 42 y.o.   MRN: [id number] D. The patient was resting in bed with her eyes closed throughout the evening.  A. Attempt made to encourage patient to attend evening wrap up group. R. The patient did not attend evening group. Remains resting in bed with eyes closed. Will continue to monitor.

## 2013-03-11 LAB — PREGNANCY, URINE: Preg Test, Ur: NEGATIVE

## 2013-03-11 LAB — VALPROIC ACID LEVEL: Valproic Acid Lvl: 32.4 ug/mL — ABNORMAL LOW (ref 50.0–100.0)

## 2013-03-11 NOTE — Progress Notes (Signed)
Adult Psychoeducational Group Note  Date:  03/11/2013 Time:  8:00 pm  Group Topic/Focus:  Wrap-Up Group:   The focus of this group is to help patients review their daily goal of treatment and discuss progress on daily workbooks.  Participation Level:  Did Not Attend  Modena Nunnery 03/11/2013, 10:37 PM

## 2013-03-11 NOTE — Progress Notes (Signed)
Patient ID: Holly Hines, female   DOB: 30-Jun-1970, 42 y.o.   MRN: [id number] D. The patient came to group this evening. She sat and listened while others shared. When it was her turn to speak, another patient began to talk about his day. She was annoyed an agitated by this and stormed out of the group. She returned on her own a few minutes later. The topic of discussion was identifying support systems as part of a recovery plan. She refused to engage in the discussion, stating that she had plenty of support and she wasn't going to tell us anything about her family. After group she paced the hallway. When asked if there was anything she needed, she politely asked if she could have a snack. She asked staff for pretzels and juice and politely said thank you after receiving them.  A. Encouraged patient to engage in group. Praised for returning to group after she had abruptly left group before it was over. Snack provided for patient. Asked patient for a urine specimen and was given a urine cup. R. The patient was compliant with request and gave a urine specimen.

## 2013-03-11 NOTE — Progress Notes (Signed)
Patient ID: Holly Hines, female   DOB: 02/28/1971, 42 y.o.   MRN: [id number] Psychoeducational Group Note  Date:  03/11/2013 Time:  0930am  Group Topic/Focus:  Making Healthy Choices:   The focus of this group is to help patients identify negative/unhealthy choices they were using prior to admission and identify positive/healthier coping strategies to replace them upon discharge.  Participation Level:  Minimal  Participation Quality:  Inattentive  Affect:  Irritable  Cognitive:  Disorganized and Delusional  Insight:  Poor  Engagement in Group:  Poor  Additional Comments:  Inventory group and Psychoeducational group   [name] 03/11/2013,9:56 AM

## 2013-03-11 NOTE — Progress Notes (Signed)
Patient ID: Holly Hines, female   DOB: [birth year], 42 y.o.   MRN: [id number] 03-11-13 nursing shift note: d: pt's behavior has improved some today. She is walking the halls, coming to the medication window and went to group. She is not staying in the group the full time but is coming for a while,  then leaving. A: she is taking her medications and still needs to be watched for "cheeking". She denies any SI. She can still be verbally aggressive with staff. V/S were obtained this am.  R: on her inventory sheet she wrote: appetite good, attention good with her depression and hopelessness both 1. No w/d symptoms and no physical problems. After discharge she plans to "changes", she stated. RN will monitor and Q 15 min ck's continue.

## 2013-03-11 NOTE — Progress Notes (Signed)
Patient ID: Holly Hines, female   DOB: Oct 01, 1970, 42 y.o.   MRN: [id number] [facility] MD Progress Note  03/11/2013 4:07 PM Holly Hines  MRN:  [id number] Subjective: Patient has been up out of bed and coming to some of the groups today. She has been less irritable and is quite appropriate with me today. Holly Hines continues to deny SI/HI/ or AVH. She is brief and curt but does respond appropriately. She is oriented x 3, but her eye contact is poor. Again she asks if she can be released. I explained that she will need to speak directly with her treating physician tomorrow as she is up for review.  Diagnosis:   DSM5: Schizophrenia Disorders:  Delusional Disorder (297.1) and Brief Psychotic Disorder (298.8) Obsessive-Compulsive Disorders:   Trauma-Stressor Disorders:   Substance/Addictive Disorders:  Patient refused urine toxicology Depressive Disorders:  Disruptive Mood Dysregulation Disorder (296.99)  Axis I: Chronic Paranoid Schizophrenia  ADL's:  Impaired  Sleep: Fair  Appetite:  Fair  Suicidal Ideation: yes Plan:  denies Intent:  denies Means:  denies Homicidal Ideation:  Plan:  denies Intent:  denies Means:  denies AEB (as evidenced by):  Psychiatric Specialty Exam: Review of Systems  Constitutional: Negative.   HENT: Negative.   Eyes: Negative.   Respiratory: Negative.   Cardiovascular: Negative.   Gastrointestinal: Negative.   Genitourinary: Negative.   Musculoskeletal: Negative.   Skin: Negative.   Neurological: Negative.   Endo/Heme/Allergies: Negative.   Psychiatric/Behavioral: Positive for depression and hallucinations. The patient is nervous/anxious.     Blood pressure 130/87, pulse 79, temperature 97.8 F (36.6 C), temperature source Oral, resp. rate 16, height 0' (0 m), weight 0 kg (0 lb).Cannot calculate BMI with a height equal to zero.  General Appearance: Disheveled  Eye Contact::  Poor  Speech:  Pressured  Volume:  Increased  Mood:  improving   Affect:  Carlis Abbott but not hostile  Thought Process:  Goal directed.  Orientation:  Full (Time, Place, and Person)  Thought Content:  Delusions and Hallucinations: Auditory  Suicidal Thoughts:  Yes.  without intent/plan  Homicidal Thoughts:  No  Memory:  Immediate;   Fair Recent;   Fair Remote;   Fair  Judgement:  Poor  Insight:  Lacking  Psychomotor Activity:  Increased  Concentration:  Poor  Recall:  Fair  Akathisia:  No  Handed:  Right  AIMS (if indicated):     Assets:  Physical Health  Sleep:  Number of Hours: 5.25   Current Medications: Current Facility-Administered Medications  Medication Dose Route Frequency Provider Last Rate Last Dose  . acetaminophen (TYLENOL) tablet 650 mg  650 mg Oral Q6H PRN Shuvon Rankin, NP      . alum & mag hydroxide-simeth (MAALOX/MYLANTA) 200-200-20 MG/5ML suspension 30 mL  30 mL Oral Q4H PRN Shuvon Rankin, NP      . benztropine (COGENTIN) tablet 1 mg  1 mg Oral BID Fransisca Kaufmann, NP   1 mg at 03/11/13 0930   Or  . benztropine mesylate (COGENTIN) injection 1 mg  1 mg Intramuscular BID Fransisca Kaufmann, NP      . Melene Muller ON 03/12/2013] divalproex (DEPAKOTE) DR tablet 500 mg  500 mg Oral BH-q7a Verne Spurr, PA-C      . divalproex (DEPAKOTE) DR tablet 750 mg  750 mg Oral QHS Verne Spurr, PA-C      . haloperidol (HALDOL) tablet 5 mg  5 mg Oral BH-q7a Verne Spurr, PA-C   5 mg at 03/11/13 7829   Or  .  haloperidol lactate (HALDOL) injection 5 mg  5 mg Intramuscular BH-q7a Verne Spurr, PA-C      . haloperidol (HALDOL) tablet 7 mg  7 mg Oral QHS Verne Spurr, PA-C       Or  . haloperidol lactate (HALDOL) injection 7 mg  7 mg Intramuscular QHS Verne Spurr, PA-C      . LORazepam (ATIVAN) tablet 1 mg  1 mg Oral Q8H PRN Shuvon Rankin, NP       Or  . LORazepam (ATIVAN) injection 1 mg  1 mg Intramuscular Q8H PRN Shuvon Rankin, NP      . magnesium hydroxide (MILK OF MAGNESIA) suspension 30 mL  30 mL Oral Daily PRN Shuvon Rankin, NP      . OLANZapine zydis  (ZYPREXA) disintegrating tablet 10 mg  10 mg Oral Q8H PRN Mojeed Akintayo      . traZODone (DESYREL) tablet 50 mg  50 mg Oral QHS PRN Fransisca Kaufmann, NP        Lab Results:  Results for orders placed during the hospital encounter of 03/06/13 (from the past 48 hour(s))  PREGNANCY, URINE     Status: None   Collection Time    03/11/13 12:03 AM      Result Value Range   Preg Test, Ur NEGATIVE  NEGATIVE   Comment:            THE SENSITIVITY OF THIS     METHODOLOGY IS >20 mIU/mL.     Performed at [facility]    Physical Findings: AIMS: Facial and Oral Movements Muscles of Facial Expression: None, normal (none observed  pt uncooperative) Lips and Perioral Area: None, normal Jaw: None, normal Tongue: None, normal,Extremity Movements Upper (arms, wrists, hands, fingers): None, normal Lower (legs, knees, ankles, toes): None, normal, Trunk Movements Neck, shoulders, hips: None, normal, Overall Severity Severity of abnormal movements (highest score from questions above): None, normal Incapacitation due to abnormal movements: None, normal Patient's awareness of abnormal movements (rate only patient's report): No Awareness, Dental Status Current problems with teeth and/or dentures?: No Does patient usually wear dentures?: No  CIWA:    COWS:     Treatment Plan Summary: Daily contact with patient to assess and evaluate symptoms and progress in treatment Medication management  Plan: 1. Patient is seen and chart is reviewed. 2. Continue Haldol dose to 7mg  at HS and continue 5mg  each AM. 3. Continue pm dose of Depakote to 750mg  at hs and continue 500mg  in the AM. 4. Depakote level tonight. 5. Continue to follow :   Medical Decision Making Problem Points:  Established problem, worsening (2), Review of last therapy session (1) and Review of psycho-social stressors (1) Data Points:  Order Aims Assessment (2) Review of medication regiment & side effects (2) Review of new  medications or change in dosage (2)  I certify that inpatient services furnished can reasonably be expected to improve the patient's condition.  Rona Ravens. [place] RPAC 4:07 PM 03/11/2013  Reviewed the information documented and agree with the treatment plan.  [place],JANARDHAHA R. 03/12/2013 8:42 AM

## 2013-03-11 NOTE — [facility] Group Notes (Signed)
[facility] Group Notes: (Clinical Social Work)   03/11/2013      Type of Therapy:  Group Therapy   Participation Level:  Did Not Attend    Ambrose Mantle, LCSW 03/11/2013, 12:50 PM

## 2013-03-12 MED ORDER — DIVALPROEX SODIUM 250 MG PO DR TAB
750.0000 mg | DELAYED_RELEASE_TABLET | Freq: Two times a day (BID) | ORAL | Status: DC
  Administered 2013-03-12 – 2013-03-13 (×2): 750 mg via ORAL
  Filled 2013-03-12 (×4): qty 3

## 2013-03-12 MED ORDER — HALOPERIDOL 5 MG PO TABS
5.0000 mg | ORAL_TABLET | Freq: Two times a day (BID) | ORAL | Status: DC
  Administered 2013-03-12 – 2013-03-13 (×2): 5 mg via ORAL
  Filled 2013-03-12 (×4): qty 1

## 2013-03-12 MED ORDER — HALOPERIDOL 5 MG PO TABS: 10.0000 mg | ORAL_TABLET | Freq: Two times a day (BID) | ORAL | Status: DC

## 2013-03-12 MED ORDER — HALOPERIDOL LACTATE 5 MG/ML IJ SOLN: 10.0000 mg | INTRAMUSCULAR | Status: DC

## 2013-03-12 MED ORDER — BENZTROPINE MESYLATE 1 MG PO TABS
1.0000 mg | ORAL_TABLET | Freq: Two times a day (BID) | ORAL | Status: DC
  Administered 2013-03-12 – 2013-03-13 (×2): 1 mg via ORAL
  Filled 2013-03-12 (×5): qty 1

## 2013-03-12 MED ORDER — HALOPERIDOL 5 MG PO TABS: 10.0000 mg | ORAL_TABLET | ORAL | Status: DC

## 2013-03-12 NOTE — Progress Notes (Signed)
Recreation Therapy Notes  Date: 11.17.2014 Time: 9:30am Location: [address] Dayroom  Group Topic: Gratitude  Goal Area(s) Addresses:  Patient will be able to identify things they are grateful for. Patient will be able to identify benefit of recognizing things they are grateful for.   Behavioral Response: Disengaged  Intervention: [place]ala   Activity: Patients were provided worksheet with "I am Grateful For" surrounded by categories - Happiness, Laughter, Work, Play, Rest, Knowledge, Education. Using these categories patients were asked identify 2-3 things they are grateful that fit into each category.   Education: Runner, broadcasting/film/video, Pharmacologist, Self-expression  Education Outcome: Acknowledges understanding.   Clinical Observations/Feedback: Patient wandered in and out of group, but never engaged or participated.   Marykay Lex Ferron Ishmael, LRT/CTRS  Jearl Klinefelter 03/12/2013 12:56 PM

## 2013-03-12 NOTE — Progress Notes (Signed)
Patient ID: Holly Hines, female   DOB: 06-24-1970, 42 y.o.   MRN: [id number] D: pt. Lying in bed, up ad lib to bathroom, poor interaction, "I'm sleepy" A: Writer introduced self to client and encouraged her to verbalize concerns. Staff encouraged group. Staff will monitor q35min for safety. R: Pt. Is safe on the unit, did not attend group.

## 2013-03-12 NOTE — Progress Notes (Signed)
Patient ID: Holly Hines, female   DOB: 01/12/1971, 42 y.o.   MRN: [id number] D. The patient was resting in bed with eyes closed and did not appear as if she would be attending evening group. She on her own, with out prompting by staff came into the day room for evening group. She chose to sit on the piano bench away from everyone else. A. Awakened to assess and informed of the evening schedule. Reviewed and administered HS medication. Offered HS snack. R. As soon as group began, Roslin began fidgeting on the piano bench. She got up and left the room before she had a turn to speak. When approached with her HS medications yelled,"I told you, I don't need this." However, she took the medication cup and swallowed the medications. Came to the dayroom on her own to get a snack.

## 2013-03-12 NOTE — Progress Notes (Signed)
D: Pt denies SI/HI/AVH. Pt easily agitated. When approached by writer this morning, pt began to yell at [employer], stating, "what do you want, get out and leave me alone". Pt stated that she is tired of taking meds and we continue to give her meds after she have already taken some earlier. Writer explained to pt that she have scheduled meds ordered  throughout the day. Writer encouraged pt to attend morning group so she can meet with CM. Pt stated, "I'm not going, I don't want to be around those people". When asked if someone or a pt have done something to her, pt stated, "I don't need to talk". Writer went to pt room to get her so she could meet with MD this morning and pt was sitting in group. Pt labile at times throughout the day when approached. Pt can be redirected. Pt has poor insight for treatment. A: Medications administered as ordered per MD. Verbal support given. 15 minute checks performed for safety. R: Pt compliant with taking meds. Pt safety maintained

## 2013-03-12 NOTE — [facility] Group Notes (Signed)
[facility] LCSW Aftercare Discharge Planning Group Note   03/12/2013 8:31 AM  Participation Quality:  Minimal  Mood/Affect:  Irritable  Depression Rating:  "I'll be depressed if you make me stay here longer."  Anxiety Rating:  "Only that you won't let me out today."  Thoughts of Suicide:  No Will you contract for safety?   NA  Current AVH:  No  Plan for Discharge/Comments:  Holly Hines is focused on being released today.  "I ain't asking you, I'm telling you."  States she will take meds when she leaves "only when I need them" and that she will go to [place] "If I need their services."  Reluctant to divulge information.  Transportation Means: unk  Supports: unk  [employer], [place] B

## 2013-03-12 NOTE — Progress Notes (Signed)
Patient ID: Holly Hines, female   DOB: 05/04/1970, 42 y.o.   MRN: [id number] Seen and agreed. Thedore Mins, MD [facility] MD Progress Note  03/12/2013 10:44 AM Holly Hines  MRN:  [id number] Subjective: "I had a good weekend and I think I am doing pretty good." Objective: Patient reports decreased agitation, mood swings, paranoia and psychosis. Patient is now compliant with her medications and has not reported any adverse reactions. Her behavior and thinking process are more organized.  Diagnosis:   DSM5: Schizophrenia Disorders:  Delusional Disorder (297.1) and Brief Psychotic Disorder (298.8) Obsessive-Compulsive Disorders:   Trauma-Stressor Disorders:   Substance/Addictive Disorders:  Patient refused urine toxicology Depressive Disorders:  Disruptive Mood Dysregulation Disorder (296.99)  Axis I: Chronic Paranoid Schizophrenia  ADL's:  Intact  Sleep: Fair  Appetite:  Fair  Suicidal Ideation: denies Plan:  denies Intent:  denies Means:  denies Homicidal Ideation:  Plan:  denies Intent:  denies Means:  denies AEB (as evidenced by):  Psychiatric Specialty Exam: Review of Systems  Constitutional: Negative.   HENT: Negative.   Eyes: Negative.   Respiratory: Negative.   Cardiovascular: Negative.   Gastrointestinal: Negative.   Genitourinary: Negative.   Musculoskeletal: Negative.   Skin: Negative.   Neurological: Negative.   Endo/Heme/Allergies: Negative.   Psychiatric/Behavioral: Positive for hallucinations.    Blood pressure 130/87, pulse 79, temperature 97.8 F (36.6 C), temperature source Oral, resp. rate 16, height 0' (0 m), weight 0 kg (0 lb).Cannot calculate BMI with a height equal to zero.  General Appearance: Fairly groomed  Patent attorney::  minimal  Speech:  Pressured  Volume:  Increased  Mood:   Irritable  Affect:  Labile and Full Range  Thought Process:  organized  Orientation:  Full (Time, Place, and Person)  Thought Content:  Delusions    Suicidal Thoughts:  denies  Homicidal Thoughts:  No  Memory:  Immediate;   Fair Recent;   Fair Remote;   Fair  Judgement:  shallow  Insight:  Lacking  Psychomotor Activity:  Increased  Concentration: fair  Recall:  Fair  Akathisia:  No  Handed:  Right  AIMS (if indicated):     Assets:  Physical Health  Sleep:  Number of Hours: 6.75   Current Medications: Current Facility-Administered Medications  Medication Dose Route Frequency Provider Last Rate Last Dose  . acetaminophen (TYLENOL) tablet 650 mg  650 mg Oral Q6H PRN Shuvon Rankin, NP      . alum & mag hydroxide-simeth (MAALOX/MYLANTA) 200-200-20 MG/5ML suspension 30 mL  30 mL Oral Q4H PRN Shuvon Rankin, NP      . benztropine (COGENTIN) tablet 1 mg  1 mg Oral BID Corine Solorio      . divalproex (DEPAKOTE) DR tablet 750 mg  750 mg Oral BID AC Fransisca Kaufmann, NP      . haloperidol (HALDOL) tablet 10 mg  10 mg Oral BID Jomo Forand      . magnesium hydroxide (MILK OF MAGNESIA) suspension 30 mL  30 mL Oral Daily PRN Shuvon Rankin, NP      . OLANZapine zydis (ZYPREXA) disintegrating tablet 10 mg  10 mg Oral Q8H PRN Chalise Pe      . traZODone (DESYREL) tablet 50 mg  50 mg Oral QHS PRN Fransisca Kaufmann, NP        Lab Results:  Results for orders placed during the hospital encounter of 03/06/13 (from the past 48 hour(s))  PREGNANCY, URINE     Status: None   Collection Time  03/11/13 12:03 AM      Result Value Range   Preg Test, Ur NEGATIVE  NEGATIVE   Comment:            THE SENSITIVITY OF THIS     METHODOLOGY IS >20 mIU/mL.     Performed at [facility]  VALPROIC ACID LEVEL     Status: Abnormal   Collection Time    03/11/13  7:58 PM      Result Value Range   Valproic Acid Lvl 32.4 (*) 50.0 - 100.0 ug/mL   Comment: Performed at [facility]    Physical Findings: AIMS: Facial and Oral Movements Muscles of Facial Expression: None, normal (none observed  pt uncooperative) Lips and Perioral Area:  None, normal Jaw: None, normal Tongue: None, normal,Extremity Movements Upper (arms, wrists, hands, fingers): None, normal Lower (legs, knees, ankles, toes): None, normal, Trunk Movements Neck, shoulders, hips: None, normal, Overall Severity Severity of abnormal movements (highest score from questions above): None, normal Incapacitation due to abnormal movements: None, normal Patient's awareness of abnormal movements (rate only patient's report): No Awareness, Dental Status Current problems with teeth and/or dentures?: No Does patient usually wear dentures?: No  CIWA:    COWS:     Treatment Plan Summary: Daily contact with patient to assess and evaluate symptoms and progress in treatment Medication management  Plan:1. Admit for crisis management and stabilization. 2. Medication management to reduce current symptoms to base line and improve the     patient's overall level of functioning 3. Treat health problems as indicated. 4. Develop treatment plan to decrease risk of relapse upon discharge and the need for     readmission. 5. Psycho-social education regarding relapse prevention and self care. 6. Health care follow up as needed for medical problems. 7. Restart home medications where appropriate. 8. Increase Depakote to 750mg  po BID 9. Continue Haldol 5mg  po BID 10. Valproic acid level on 03/13/13 Medical Decision Making Problem Points:  Established problem, improving (1), Review of last therapy session (1) and Review of psycho-social stressors (1) Data Points:  Order Aims Assessment (2) Review of medication regiment & side effects (2) Review of new medications or change in dosage (2)  I certify that inpatient services furnished can reasonably be expected to improve the patient's condition.   Thedore Mins, MD 03/12/2013, 10:44 AM

## 2013-03-12 NOTE — Progress Notes (Signed)
Adult Psychoeducational Group Note  Date:  03/12/2013 Time:  11:34 AM  Group Topic/Focus:  Wellness Toolbox:   The focus of this group is to discuss various aspects of wellness, balancing those aspects and exploring ways to increase the ability to experience wellness.  Patients will create a wellness toolbox for use upon discharge.  Participation Level:  Did Not Attend  Participation Quality:    Affect:    Cognitive:    Insight:   Engagement in Group:    Modes of Intervention:    Additional Comments:  Pt refused to attend.  Kyson Kupper M 03/12/2013, 11:34 AM  

## 2013-03-12 NOTE — [facility] Group Notes (Signed)
[facility] LCSW Group Therapy  03/12/2013 1:15 pm  Type of Therapy: Process Group Therapy  Did not attend  Summary of Progress/Problems: Today's group addressed the issue of overcoming obstacles.  Patients were asked to identify their biggest obstacle post d/c that stands in the way of their on-going success, and then problem solve as to how to manage this.  Daryel Gerald B 03/12/2013   3:24 PM

## 2013-03-12 NOTE — Progress Notes (Signed)
Seen and agreed. Ahmani Daoud, MD 

## 2013-03-12 NOTE — Tx Team (Signed)
  Interdisciplinary Treatment Plan Update   Date Reviewed:  03/12/2013  Time Reviewed:  8:31 AM  Progress in Treatment:   Attending groups: Yes Participating in groups: Minimally Taking medication as prescribed: Yes  Tolerating medication: Yes Family/Significant other contact made: No  Patient understands diagnosis: No    Discussing patient identified problems/goals with staff: Yes Medical problems stabilized or resolved: Yes Denies suicidal/homicidal ideation: Yes  In tx team Patient has not harmed self or others: Yes  For review of initial/current patient goals, please see plan of care.  Estimated Length of Stay:  Likely d/c tomorrow  Reason for Continuation of Hospitalization:   New Problems/Goals identified:  N/A  Discharge Plan or Barriers:   return home, follow up [facility]  Additional Comments:  Attendees:  Signature: Thedore Mins, MD 03/12/2013 8:31 AM   Signature: Richelle Ito, LCSW 03/12/2013 8:31 AM  Signature: Fransisca Kaufmann, NP 03/12/2013 8:31 AM  Signature: Joslyn Devon, RN 03/12/2013 8:31 AM  Signature: Liborio Nixon, RN 03/12/2013 8:31 AM  Signature:  03/12/2013 8:31 AM  Signature:   03/12/2013 8:31 AM  Signature:    Signature:    Signature:    Signature:    Signature:    Signature:      Scribe for Treatment Team:   Richelle Ito, LCSW  03/12/2013 8:31 AM

## 2013-03-12 NOTE — [facility] Counselor (Signed)
Adult Comprehensive Assessment  Patient ID: Holly Hines, female   DOB: 03-31-1971, 42 y.o.   MRN: [id number]  Information Source:    Current Stressors:     Living/Environment/Situation:     Family History:     Childhood History:  By whom was/is the patient raised?: Both parents Patient's description of current relationship with people who raised him/her: both are deceased Does patient have siblings?: Yes Number of Siblings: 1 Description of patient's current relationship with siblings: has "some" contact with brother  Education:  Highest grade of school patient has completed: GED  Employment/Work Situation:      Surveyor, quantity Resources:      Alcohol/Substance Abuse:      Social Support System:      Leisure/Recreation:      Strengths/Needs:      Discharge Plan:      Summary/Recommendations:   Summary and Recommendations (to be completed by the evaluator): Holly Hines refused to answer any personal questions during her stay.  She was approached on 11/12, 11/14 and 11/17 about doing the PSA.  Response was always the same.  "Why should I talk to you about my business?"  Information gathered was from minimal information given at a previous admission.  Daryel Gerald B. 03/12/2013

## 2013-03-12 NOTE — [facility] Suicide Risk Assessment (Signed)
[facility] INPATIENT:  Family/Significant Other Suicide Prevention Education  Suicide Prevention Education:  Patient Refusal for Family/Significant Other Suicide Prevention Education: The patient Holly Hines has refused to provide written consent for family/significant other to be provided Family/Significant Other Suicide Prevention Education during admission and/or prior to discharge.  Physician notified.  Daryel Gerald B 03/12/2013, 12:06 PM

## 2013-03-13 DIAGNOSIS — F200 Paranoid schizophrenia: Principal | ICD-10-CM

## 2013-03-13 LAB — VALPROIC ACID LEVEL: Valproic Acid Lvl: 94.4 ug/mL (ref 50.0–100.0)

## 2013-03-13 MED ORDER — DIVALPROEX SODIUM 250 MG PO DR TAB: 750.0000 mg | DELAYED_RELEASE_TABLET | Freq: Two times a day (BID) | ORAL | Status: DC

## 2013-03-13 MED ORDER — HALOPERIDOL 5 MG PO TABS: 5.0000 mg | ORAL_TABLET | Freq: Two times a day (BID) | ORAL | Status: DC

## 2013-03-13 MED ORDER — TRAZODONE HCL 50 MG PO TABS: 50.0000 mg | ORAL_TABLET | Freq: Every evening | ORAL | Status: DC | PRN

## 2013-03-13 MED ORDER — BENZTROPINE MESYLATE 1 MG PO TABS: 1.0000 mg | ORAL_TABLET | Freq: Two times a day (BID) | ORAL | Status: DC

## 2013-03-13 NOTE — [facility] Suicide Risk Assessment (Signed)
Suicide Risk Assessment  Discharge Assessment     Demographic Factors:  Low socioeconomic status, Unemployed and female  Mental Status Per Nursing Assessment::   On Admission:  Suicidal ideation indicated by others;Self-harm behaviors  Current Mental Status by Physician: patient denies suicidal ideation, intent or plan  Loss Factors: Financial problems/change in socioeconomic status  Historical Factors: Impulsivity  Risk Reduction Factors:   Living with another person, especially a relative  Continued Clinical Symptoms:  Schizophrenia:   Paranoid or undifferentiated type  Cognitive Features That Contribute To Risk:  Closed-mindedness Polarized thinking    Suicide Risk:  Minimal: No identifiable suicidal ideation.  Patients presenting with no risk factors but with morbid ruminations; may be classified as minimal risk based on the severity of the depressive symptoms  Discharge Diagnoses:   AXIS I:  Schizophrenia, paranoid  AXIS II:  Deferred AXIS III:   Past Medical History  Diagnosis Date  . Mental disorder   . Pregnant    AXIS IV:  other psychosocial or environmental problems and problems related to social environment AXIS V:  61-70 mild symptoms  Plan Of Care/Follow-up recommendations:  Activity:  as tolerated Diet:  healthy Tests:  Valproic acid level: 96.4 Other:  patient to keep her after care appointment  Is patient on multiple antipsychotic therapies at discharge:  No   Has Patient had three or more failed trials of antipsychotic monotherapy by history:  No  Recommended Plan for Multiple Antipsychotic Therapies: NA  Thedore Mins, MD 03/13/2013, 9:49 AM
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
## 2013-03-13 NOTE — Progress Notes (Signed)
[facility] Adult Case Management Discharge Plan :  Will you be returning to the same living situation after discharge: Yes,  pt unwilling to divulge At discharge, do you have transportation home?:Yes,  bus pass Do you have the ability to pay for your medications:Yes,  mental health  Release of information consent forms completed and in the chart;  Patient's signature needed at discharge.  Patient to Follow up at: Follow-up Information   Follow up with [facility]. (Go to the walk-in clinic M-F between 8 and 9AM for your hospital follow-up appointment)    Contact information:   [address]  [place]  [phone]      Patient denies SI/HI:   Yes,  yes    Safety Planning and Suicide Prevention discussed:  Yes,  yes  Holly Hines 03/13/2013, 9:59 AM

## 2013-03-13 NOTE — Progress Notes (Signed)
Psychoeducational Group Note  Date:  03/13/2013 Time:  0945  Group Topic/Focus:  Recovery Goals:   The focus of this group is to identify appropriate goals for recovery and establish a plan to achieve them.  Participation Level: Did Not Attend  Participation Quality:  Not Applicable  Affect:  Not Applicable  Cognitive:  Not Applicable  Insight:  Not Applicable  Engagement in Group: Not Applicable  Additional Comments:  Brennley did not attend group, patient remained in bed.  Karleen Hampshire Brittini 03/13/2013, 10:50 AM '

## 2013-03-13 NOTE — Progress Notes (Signed)
Discharge note: Pt received both written and verbal discharge instructions. Pt verbalized understanding of discharge instructions. Pt received sample meds, prescriptions, bus pass and belongings from room and locker. Pt safely left [facility]. Pt denied SI/HI/AVH at time of discharge.

## 2013-03-15 NOTE — Discharge Summary (Signed)
Seen and agreed. Gavon Majano, MD 

## 2013-03-16 NOTE — Progress Notes (Signed)
Patient Discharge Instructions:  After Visit Summary (AVS):   Faxed to:  03/16/13 Discharge Summary Note:   Faxed to:  03/16/13 Psychiatric Admission Assessment Note:   Faxed to:  03/16/13 Suicide Risk Assessment - Discharge Assessment:   Faxed to:  03/16/13 Faxed/Sent to the Next Level Care provider:  03/16/13 Faxed to [facility] @ [phone]  Jerelene Redden, 03/16/2013, 3:35 PM

## 2013-04-13 ENCOUNTER — Encounter (HOSPITAL_COMMUNITY): Payer: Self-pay | Admitting: Emergency Medicine

## 2013-04-13 ENCOUNTER — Emergency Department (HOSPITAL_COMMUNITY)
Admission: EM | Admit: 2013-04-13 | Discharge: 2013-04-13 | Disposition: A | Discharge status: Home / Self Care | Payer: Self-pay | Attending: Emergency Medicine | Admitting: Emergency Medicine

## 2013-04-13 DIAGNOSIS — B889 Infestation, unspecified: Secondary | ICD-10-CM

## 2013-04-13 DIAGNOSIS — Z3202 Encounter for pregnancy test, result negative: Secondary | ICD-10-CM | POA: Insufficient documentation

## 2013-04-13 DIAGNOSIS — Z79899 Other long term (current) drug therapy: Secondary | ICD-10-CM | POA: Insufficient documentation

## 2013-04-13 DIAGNOSIS — Z8659 Personal history of other mental and behavioral disorders: Secondary | ICD-10-CM | POA: Insufficient documentation

## 2013-04-13 DIAGNOSIS — B880 Other acariasis: Secondary | ICD-10-CM | POA: Insufficient documentation

## 2013-04-13 DIAGNOSIS — R21 Rash and other nonspecific skin eruption: Secondary | ICD-10-CM | POA: Insufficient documentation

## 2013-04-13 LAB — POCT PREGNANCY, URINE: Preg Test, Ur: NEGATIVE

## 2013-04-13 LAB — URINALYSIS, ROUTINE W REFLEX MICROSCOPIC
Hgb urine dipstick: NEGATIVE
Ketones, ur: 15 mg/dL — AB
Leukocytes, UA: NEGATIVE
Nitrite: NEGATIVE
Urobilinogen, UA: 1 mg/dL (ref 0.0–1.0)
pH: 5.5 (ref 5.0–8.0)

## 2013-04-13 LAB — WET PREP, GENITAL
Clue Cells Wet Prep HPF POC: NONE SEEN
Trich, Wet Prep: NONE SEEN
WBC, Wet Prep HPF POC: NONE SEEN

## 2013-04-13 MED ORDER — PERMETHRIN 5 % EX CREA: TOPICAL_CREAM | CUTANEOUS | Status: DC

## 2013-04-13 MED ORDER — HYDROXYZINE HCL 25 MG PO TABS: 25.0000 mg | ORAL_TABLET | Freq: Four times a day (QID) | ORAL | Status: DC

## 2013-04-13 NOTE — ED Notes (Signed)
Pt reports that she has been having vaginal itching x 2 days. Denies discharge.

## 2013-04-13 NOTE — ED Provider Notes (Addendum)
CSN: [id number]     Arrival date & time 04/13/13  4098 History   First MD Initiated Contact with Patient 04/13/13 [phone]     Chief Complaint  Patient presents with  . Vaginal Itching   (Consider location/radiation/quality/duration/timing/severity/associated sxs/prior Treatment) Patient is a 42 y.o. female presenting with vaginal itching. The history is provided by the patient.  Vaginal Itching This is a new problem. Episode onset: 3 days. The problem occurs constantly. The problem has been gradually worsening. Associated symptoms comments: Rash on the arms, breast and chest mostly that started 1 week ago and itchy.  There are animals in the place she is now staying and this is a new home.  Unclear if anyone else has sx.  Pt denies new partners and states that she uses protection most of the time.  Denies dysuria,vaginal d/c.Marland Kitchen Nothing aggravates the symptoms. Nothing relieves the symptoms. She has tried nothing for the symptoms. The treatment provided no relief.    Past Medical History  Diagnosis Date  . Mental disorder   . Pregnant    Past Surgical History  Procedure Laterality Date  . Cesarean section  9 yrs ago    History reviewed. No pertinent family history. History  Substance Use Topics  . Smoking status: Never Smoker   . Smokeless tobacco: Not on file  . Alcohol Use: No   OB History   Grav Para Term Preterm Abortions TAB SAB Ect Mult Living   2 1 1       1      Review of Systems  Genitourinary: Negative for vaginal bleeding and vaginal discharge.  Skin: Positive for rash.  All other systems reviewed and are negative.    Allergies  Review of patient's allergies indicates no known allergies.  Home Medications   Current Outpatient Rx  Name  Route  Sig  Dispense  Refill  . benztropine (COGENTIN) 1 MG tablet   Oral   Take 1 tablet (1 mg total) by mouth 2 (two) times daily.   60 tablet   0   . divalproex (DEPAKOTE) 250 MG DR tablet   Oral   Take 3 tablets (750 mg  total) by mouth 2 (two) times daily before a meal.   60 tablet   0   . haloperidol (HALDOL) 5 MG tablet   Oral   Take 1 tablet (5 mg total) by mouth 2 (two) times daily.   60 tablet   0   . traZODone (DESYREL) 50 MG tablet   Oral   Take 1 tablet (50 mg total) by mouth at bedtime as needed for sleep.   30 tablet   0    BP 143/110  Pulse 74  Temp(Src) 98.5 F (36.9 C) (Oral)  SpO2 100%  LMP 03/30/2013  Breastfeeding? Unknown Physical Exam  Nursing note and vitals reviewed. Constitutional: She is oriented to person, place, and time. She appears well-developed and well-nourished. No distress.  HENT:  Head: Normocephalic and atraumatic.  Mouth/Throat: Oropharynx is clear and moist.  Eyes: Conjunctivae and EOM are normal. Pupils are equal, round, and reactive to light.  Neck: Normal range of motion. Neck supple.  Cardiovascular: Normal rate, regular rhythm and intact distal pulses.   No murmur heard. Pulmonary/Chest: Effort normal and breath sounds normal. No respiratory distress. She has no wheezes. She has no rales.  Abdominal: Soft. She exhibits no distension. There is no tenderness. There is no rebound and no guarding.  Genitourinary: Uterus normal.    There is no  rash or lesion on the right labia. There is no rash or lesion on the left labia. Cervix exhibits no motion tenderness, no discharge and no friability. Right adnexum displays no mass and no tenderness. Left adnexum displays no mass and no tenderness. No tenderness around the vagina. No vaginal discharge found.  Musculoskeletal: Normal range of motion. She exhibits no edema and no tenderness.  Neurological: She is alert and oriented to person, place, and time.  Skin: Skin is warm and dry. Lesion and rash noted. No erythema.  Small lesions consistent with mite bites over the upper ext, breast and chest with mild erythema and excoriation.  Psychiatric: She has a normal mood and affect. Her behavior is normal.  guarded     ED Course  Procedures (including critical care time) Labs Review Labs Reviewed  WET PREP, GENITAL  GC/CHLAMYDIA PROBE AMP  URINALYSIS, ROUTINE W REFLEX MICROSCOPIC  POCT PREGNANCY, URINE   Imaging Review No results found.  EKG Interpretation   None       MDM   1. Mite infestation     Pt here with c/o of rash for 1 week that is itchy and appears some type of mite bite.  It is concentrated on the breast and upper arms.  Possible scabies but pt states sleeping somewhere new and says there are animals and unclear if others in the same place have issues so possible flea bites vs bed bugs.  Secondly pt c/o of vaginal rash for the last 3 days.  Denies new partners and states uses protection most of the times.  Will do pelvic exam and swabs.  8:35 AM Wet prep neg.  UPT neg.  UA without concerning findings.  Will start on permethrin cream and benadryl but also recommended exterminating the house.  Gwyneth Sprout, MD 04/13/13 1478  Gwyneth Sprout, MD 04/13/13 [phone]

## 2013-04-14 LAB — GC/CHLAMYDIA PROBE AMP
CT Probe RNA: NEGATIVE
GC Probe RNA: NEGATIVE

## 2013-04-27 ENCOUNTER — Emergency Department (HOSPITAL_COMMUNITY)
Admission: EM | Admit: 2013-04-27 | Discharge: 2013-04-27 | Disposition: A | Discharge status: Home / Self Care | Payer: Self-pay | Attending: Emergency Medicine | Admitting: Emergency Medicine

## 2013-04-27 ENCOUNTER — Encounter (HOSPITAL_COMMUNITY): Payer: Self-pay | Admitting: Emergency Medicine

## 2013-04-27 DIAGNOSIS — F489 Nonpsychotic mental disorder, unspecified: Secondary | ICD-10-CM | POA: Insufficient documentation

## 2013-04-27 DIAGNOSIS — Z7721 Contact with and (suspected) exposure to potentially hazardous body fluids: Secondary | ICD-10-CM | POA: Insufficient documentation

## 2013-04-27 DIAGNOSIS — I10 Essential (primary) hypertension: Secondary | ICD-10-CM | POA: Insufficient documentation

## 2013-04-27 DIAGNOSIS — Z113 Encounter for screening for infections with a predominantly sexual mode of transmission: Secondary | ICD-10-CM

## 2013-04-27 DIAGNOSIS — Z3202 Encounter for pregnancy test, result negative: Secondary | ICD-10-CM | POA: Insufficient documentation

## 2013-04-27 LAB — WET PREP, GENITAL
Clue Cells Wet Prep HPF POC: NONE SEEN
TRICH WET PREP: NONE SEEN
YEAST WET PREP: NONE SEEN

## 2013-04-27 LAB — URINALYSIS, ROUTINE W REFLEX MICROSCOPIC
Bilirubin Urine: NEGATIVE
GLUCOSE, UA: NEGATIVE mg/dL
Hgb urine dipstick: NEGATIVE
Ketones, ur: NEGATIVE mg/dL
LEUKOCYTES UA: NEGATIVE
Nitrite: NEGATIVE
Protein, ur: NEGATIVE mg/dL
Specific Gravity, Urine: 1.029 (ref 1.005–1.030)
Urobilinogen, UA: 1 mg/dL (ref 0.0–1.0)
pH: 5.5 (ref 5.0–8.0)

## 2013-04-27 LAB — PREGNANCY, URINE: Preg Test, Ur: NEGATIVE

## 2013-04-27 NOTE — ED Provider Notes (Signed)
CSN: [id number]     Arrival date & time 04/27/13  0645 History   First MD Initiated Contact with Patient 04/27/13 [phone]     Chief Complaint  Patient presents with  . Exposure to STD   (Consider location/radiation/quality/duration/timing/severity/associated sxs/prior Treatment) Patient is a 43 y.o. female presenting with STD exposure. The history is provided by the patient.  Exposure to STD Pertinent negatives include no chest pain, no abdominal pain and no shortness of breath.  .patient reports with concern for STD. Patient had sexual intercourse with a man 2 days ago was to make sure she does not have an STD. Patient reports she did not use any protection is not on any birth control at this time. Patient denies vaginal discharge but does report itching. Patient's past medical history is significant for mental disorder only.  Patient is insisting on a female provider to do the pelvic. Will get one of our PAs to do that. Past Medical History  Diagnosis Date  . Mental disorder   . Pregnant    Past Surgical History  Procedure Laterality Date  . Cesarean section  9 yrs ago    No family history on file. History  Substance Use Topics  . Smoking status: Never Smoker   . Smokeless tobacco: Not on file  . Alcohol Use: No   OB History   Grav Para Term Preterm Abortions TAB SAB Ect Mult Living   2 1 1       1      Review of Systems  Constitutional: Negative for fever.  HENT: Negative for congestion.   Respiratory: Negative for shortness of breath.   Cardiovascular: Negative for chest pain.  Gastrointestinal: Negative for nausea, vomiting and abdominal pain.  Genitourinary: Negative for dysuria, frequency, hematuria, vaginal bleeding, vaginal discharge, vaginal pain and pelvic pain.  Musculoskeletal: Negative for back pain.  Skin: Negative for rash.  Hematological: Does not bruise/bleed easily.  Psychiatric/Behavioral: Negative for confusion.    Allergies  Pollen extract  Home  Medications   Current Outpatient Rx  Name  Route  Sig  Dispense  Refill  . diphenhydrAMINE (BENADRYL) 25 mg capsule   Oral   Take 25 mg by mouth every 6 (six) hours as needed for allergies.          BP 189/119  Pulse 78  Temp(Src) 97.1 F (36.2 C) (Oral)  Resp 15  SpO2 100%  LMP 03/30/2013  Breastfeeding? No Physical Exam  Nursing note and vitals reviewed. Constitutional: She appears well-developed and well-nourished. No distress.  HENT:  Head: Normocephalic and atraumatic.  Eyes: Conjunctivae and EOM are normal. Pupils are equal, round, and reactive to light.  Abdominal: Soft. Bowel sounds are normal. There is no tenderness.  Genitourinary:  Pelvic as per mid-level  Musculoskeletal: Normal range of motion.  Neurological: She is alert. No cranial nerve deficit. She exhibits normal muscle tone. Coordination normal.  Skin: Skin is warm. No rash noted.    ED Course  Procedures (including critical care time) Labs Review Labs Reviewed  WET PREP, GENITAL - Abnormal; Notable for the following:    WBC, Wet Prep HPF POC FEW (*)    All other components within normal limits  URINALYSIS, ROUTINE W REFLEX MICROSCOPIC - Abnormal; Notable for the following:    APPearance CLOUDY (*)    All other components within normal limits  GC/CHLAMYDIA PROBE AMP  PREGNANCY, URINE   Results for orders placed during the hospital encounter of 04/27/13  WET PREP, GENITAL  Result Value Range   Yeast Wet Prep HPF POC NONE SEEN  NONE SEEN   Trich, Wet Prep NONE SEEN  NONE SEEN   Clue Cells Wet Prep HPF POC NONE SEEN  NONE SEEN   WBC, Wet Prep HPF POC FEW (*) NONE SEEN  URINALYSIS, ROUTINE W REFLEX MICROSCOPIC      Result Value Range   Color, Urine YELLOW  YELLOW   APPearance CLOUDY (*) CLEAR   Specific Gravity, Urine 1.029  1.005 - 1.030   pH 5.5  5.0 - 8.0   Glucose, UA NEGATIVE  NEGATIVE mg/dL   Hgb urine dipstick NEGATIVE  NEGATIVE   Bilirubin Urine NEGATIVE  NEGATIVE   Ketones, ur  NEGATIVE  NEGATIVE mg/dL   Protein, ur NEGATIVE  NEGATIVE mg/dL   Urobilinogen, UA 1.0  0.0 - 1.0 mg/dL   Nitrite NEGATIVE  NEGATIVE   Leukocytes, UA NEGATIVE  NEGATIVE  PREGNANCY, URINE      Result Value Range   Preg Test, Ur NEGATIVE  NEGATIVE    Imaging Review No results found.  EKG Interpretation   None       MDM   1. Screen for STD (sexually transmitted disease)   2. Hypertension    Pelvic examination done by mid-level. The prep without any sniffing findings urinalysis negative for urinary tract infection. Pretty test negative. Patient has been consistently hypertensive here may have undiagnosed hypertension. Will refer to wellness clinic and given resource guide for followup for hypertension. Patient asymptomatic currently from hypertension. Patient's cultures from the pelvic are pending wet prep showed no evidence of yeast infection treat infection or bacterial vaginosis.   Medical screening examination/treatment/procedure(s) were conducted as a shared visit with non-physician practitioner(s) and myself.  I personally evaluated the patient during the encounter.  EKG Interpretation   None          Shelda JakesScott W. Dianca Owensby, MD 04/27/13 1021

## 2013-04-27 NOTE — ED Notes (Signed)
Pt would not sign her discharge papers. Ambulatory to discharge, took all belongings with her.

## 2013-04-27 NOTE — ED Provider Notes (Signed)
Patient request for female provider. Pelvic exam: VULVA: normal appearing vulva with no masses, tenderness or lesions, no rashes VAGINA: vaginal discharge - white and curd-like, CERVIX: DNA probe for chlamydia and GC obtained, cervical motion tenderness absent, nulliparous os, UTERUS: uterus is normal size, shape, consistency and nontender, ADNEXA: normal adnexa in size, nontender and no masses, exam chaperoned.  Arthor Captainbigail Sadeen Wiegel, PA-C 04/27/13 1134

## 2013-04-27 NOTE — ED Notes (Signed)
Pt reports "I had sexual intercourse with a man two days ago and I want to make sure I do not have an STD." pt reports she did not use any protection and is not on any birth control at this time. Pt denies vaginal discharge but reports itching.

## 2013-04-27 NOTE — ED Notes (Signed)
EDP made aware of patient's blood pressure. Pt is ready to leave. Is sitting in chair with her suitcase and all belongings in lap.

## 2013-04-27 NOTE — ED Notes (Signed)
Pelvic cart is ready at bedside.

## 2013-04-27 NOTE — Discharge Instructions (Signed)
Pelvic examination without any significant findings. Patient hypertensive here will need followup for that I will give patient resource guide make appointment at the wellness clinic for followup or the health department. Should her blood pressure rechecked in several times over the next 2 weeks. Cultures from the pelvic are still pending if they are positive he will be notified. Wet prep showed no abnormalities.    Emergency Department Resource Guide 1) Find a Doctor and Pay Out of Pocket Although you won't have to find out who is covered by your insurance plan, it is a good idea to ask around and get recommendations. You will then need to call the office and see if the doctor you have chosen will accept you as a new patient and what types of options they offer for patients who are self-pay. Some doctors offer discounts or will set up payment plans for their patients who do not have insurance, but you will need to ask so you aren't surprised when you get to your appointment.  2) Contact Your Local Health Department Not all health departments have doctors that can see patients for sick visits, but many do, so it is worth a call to see if yours does. If you don't know where your local health department is, you can check in your phone book. The CDC also has a tool to help you locate your state's health department, and many state websites also have listings of all of their local health departments.  3) Find a Walk-in Clinic If your illness is not likely to be very severe or complicated, you may want to try a walk in clinic. These are popping up all over the country in pharmacies, drugstores, and shopping centers. They're usually staffed by nurse practitioners or physician assistants that have been trained to treat common illnesses and complaints. They're usually fairly quick and inexpensive. However, if you have serious medical issues or chronic medical problems, these are probably not your best  option.  No Primary Care Doctor: - Call Health Connect at  [phone] - they can help you locate a primary care doctor that  accepts your insurance, provides certain services, etc. - Physician Referral Service- [phone]  Chronic Pain Problems: Organization         Address  Phone   Notes  [facility]  [phone] Patients need to be referred by their primary care doctor.   Medication Assistance: Organization         Address  Phone   Notes  [facility] [address] [place], [place] [address] [phone] --Must be a resident of [facility] -- Must have NO insurance coverage whatsoever (no Medicaid/ Medicare, etc.) -- The pt. MUST have a primary care doctor that directs their care regularly and follows them in the community   MedAssist  [phone]) [phone]   [employer]  [phone]) [phone]    Agencies that provide inexpensive medical care: Organization         Address  Phone   Notes  [facility]  [phone]) [phone]   [facility]    [phone]   [facility] [address] [place], [place] [address] [phone]   [facility] [address], [place] [phone]) [phone]   Planned Parenthood    [phone]) [phone]   [facility]    [phone]) [phone]   [facility]  [address], [place] Phone:  [phone], Fax:  [phone] Hours of Operation:  9 am - 6 pm, M-F.  Also accepts Medicaid/Medicare and self-pay.  [facility] for [place], [address], [place] Phone: [phone], Fax: [phone]. Hours of Operation:  8:30 am - 5:30 pm, M-F.  Also accepts Medicaid and self-pay.  [facility] [place] [address] [place] Phone: [phone]   [place], [place], [place] [phone], Ext. [phone] Mondays & Thursdays: 7-9 AM.  First 15  patients are seen on a first come, first serve basis.    [place] Providers:  Organization         Address  Phone   Notes  [facility] [address], [place] [phone] Also accepts self-pay patients.  [facility] [address], [place]  [phone]   [place], [address], [place] [phone]   [facility] [address], [place] [phone]   Lucianne Lei [address], [place]   [phone] Only accepts [place] Access [place] patients after they have their name applied to their card.   Self-Pay (no insurance) in [facility]:  Organization         Address  Phone   Notes  Sickle Cell Patients, [facility] [place] [phone]   [facility] [place] [phone]   [facility] [place]  [place], [place], [place] [phone]   [facility]/Dr. Osei-Bonsu  [address], [place] or [address], [place] [phone] Phone number for both [place] and [place] locations is the same.  [facility] [address] [place] [phone]   [facility] [address], [place] or [address] [phone]   [facility] [address] [place] [phone], phone; [phone], fax Sees patients 1st and 3rd Saturday of every month.  Must not qualify for public or private insurance (i.e. Medicaid, Medicare, [place] Health Choice, Veterans' Benefits)  Household income should be no more than 200% of the poverty level The clinic cannot treat you if you are pregnant or think you are pregnant  Sexually transmitted diseases are not treated at the clinic.    Dental  Care: Organization         Address  Phone  Notes  [facility] [place] [phone] Accepts children up to age 58 who are enrolled in [place] or [place]; pregnant women with a Medicaid card; and children who have applied for Medicaid or [place] Health Choice, but were declined, whose parents can pay a reduced fee at time of service.  [facility] of [facility]  [address], [place] [phone] Accepts children up to age 82 who are enrolled in [place] or [place]; pregnant women with a Medicaid card; and children who have applied for Medicaid or  Health Choice, but were declined, whose parents can pay a reduced fee at time of service.  [facility] Adult Dental Access PROGRAM  [place] [phone] Patients are  seen by appointment only. Walk-ins are not accepted. [place] will see patients 55 years of age and older. Monday - Tuesday (8am-5pm) Most Wednesdays (8:30-5pm) $30 per visit, cash only  [facility] Adult Dental Access PROGRAM  [address], [facility] [phone] Patients are seen by appointment only. Walk-ins are not accepted. [place] will see patients 42 years of age and older. One Wednesday Evening (Monthly: Volunteer Based).  $30 per visit, cash only  [place]  [phone] for adults; Children under age 42, call Graduate Pediatric Dentistry at [phone]. Children aged 37-14, please call [phone] to request a pediatric application.  Dental services are provided in all areas of dental care including fillings, crowns and bridges, complete and partial dentures, implants, gum treatment, root canals, and extractions. Preventive care is also provided. Treatment is provided to both adults and children. Patients are selected via a lottery and there is often a waiting list.   [facility] [address] [place]  [phone] [email or web address]   [facility] [address] [place], [place] [phone], Ext. [phone] Second and Fourth Thursday of each month, opens at 6:30 AM; Clinic ends at 9 AM.  Patients are seen on a first-come first-served basis, and a limited number are seen during each clinic.   [facility]  [address] Hillard Danker [place], [place] [phone]   Eligibility Requirements You must have lived in [place], [place], or [place] counties for at least the last three months.   You cannot be eligible for state or federal sponsored [employer], including [employer], [place], or [employer].   You generally cannot be eligible for healthcare insurance through your employer.    How to apply: Eligibility screenings are held every Tuesday and Wednesday afternoon from 1:00 pm until 4:00 pm. You do not need an appointment for the interview!  [facility] [address], [place], [place]   [place]  [place] Department  [place]  [phone]    Behavioral Health Resources in the Community: Intensive Outpatient Programs Organization         Address  Phone  Notes  [place]. [address], [place], [place] [phone]   [facility] [address] [place], [place]   ADS: Alcohol & Drug Svcs [address], [place], [place]   [place] [address]  [place], [place] or [phone]   Substance Abuse Resources Organization         Address  Phone  Notes  Alcohol and Drug Services  [phone]   [place]  [phone]   [facility] [place]   Chinita Pester  [phone]   Residential & Outpatient Substance Abuse Program  [phone]    Psychological Services Organization         Address  Phone  Notes  [facility] [place]  [place]  [phone]   [place] [address] [place] or [phone]    Mobile Crisis Teams Organization         Address  Phone  Notes  [facility], Mobile Crisis Care Unit  [phone]   Assertive Psychotherapeutic Services  [address] [place], [place]   [facility] [address] [place]  [place] [phone]    Self-Help/Support Groups Organization         Address  Phone             Notes  [facility] of [place] - variety of support groups  [place] Call for more information  Narcotics Anonymous (NA), Caring Services [address], [employer] [place]  2 meetings at this location   Special educational needs teacher         Address  Phone  Notes  ASAP Residential Treatment [place],    [place]  1-4356619613   [facility]  [address], [place] [phone], [place], [place]   [place], [place] [phone] Admissions: 8am-3pm M-F  Incentives Substance [place] [address],    [place], [place] [phone]   [facility] [address] [place], [place], [place]   [facility] [address],  [place], [place]   [facility] Programs - Intensive Outpatient [address], [place], [place]   [facility] [address]  [place], [place] [phone] or [phone]   [facility] (RTS) [address] [place], [place] Accepts Medicaid  [facility] [address]  [place] [phone] Substance Abuse/Addiction Treatment   [facility] Organization         Address  Phone  Notes  [facility]  4371576689   Domenic Schwab, PhD [address] [place]lis [place], [place]   [phone] or [phone]   [place], [place] [phone]   [facility] [address], [place], [place] [phone] Insurance/Medicaid/sponsorship through [facility] and Families [address] [place]                                    [place], [place] [phone] [place] [address], [place] [phone]    Dr. Adele Schilder  [phone]   [facility] Dept. 1) [address] [place] 2) [place] 3)  [place] [address], [place] [phone]  [phone]   [place] [phone] or [phone] (After Hours)

## 2013-04-27 NOTE — ED Notes (Signed)
Pt sitting in chair. Asked for urine sample reports just used the bathroom. Given urine cup and cup of water to provide sample when able. Pt has suitcase and bags beside her.

## 2013-04-27 NOTE — ED Provider Notes (Signed)
MSE was initiated and I personally evaluated the patient and placed orders (if any) at  6:52 AM on April 27, 2013.  The patient appears stable so that the remainder of the MSE may be completed by another provider.  Patient reports vaginal itching.  She would like for female physician to perform the pelvic examination.  I informed her that we'll do we can tio honor this wish however it may not be possible based on physician in mid level staffing.  Patient has a history of schizophrenia  Lyanne CoKevin M Takima [place], MD 04/27/13 [phone]

## 2013-04-29 LAB — GC/CHLAMYDIA PROBE AMP
CT Probe RNA: NEGATIVE
GC Probe RNA: NEGATIVE

## 2013-04-30 NOTE — ED Provider Notes (Signed)
Medical screening examination/treatment/procedure(s) were conducted as a shared visit with non-physician practitioner(s) and myself.  I personally evaluated the patient during the encounter.  EKG Interpretation   None         Shelda JakesScott W. Rishit Burkhalter, MD 04/30/13 [id number]

## 2013-06-07 ENCOUNTER — Telehealth (HOSPITAL_COMMUNITY): Payer: Self-pay

## 2013-06-07 NOTE — ED Notes (Signed)
Pt calling for STD results.  ID verified x 2.  Pt informed both Gonorrhea and Chlamydia results were negative.

## 2013-06-19 ENCOUNTER — Emergency Department (HOSPITAL_COMMUNITY)
Admission: EM | Admit: 2013-06-19 | Discharge: 2013-06-19 | Discharge status: Against Medical Advice | Payer: Self-pay | Attending: Emergency Medicine | Admitting: Emergency Medicine

## 2013-06-19 ENCOUNTER — Encounter (HOSPITAL_COMMUNITY): Payer: Self-pay | Admitting: Emergency Medicine

## 2013-06-19 DIAGNOSIS — F17200 Nicotine dependence, unspecified, uncomplicated: Secondary | ICD-10-CM | POA: Insufficient documentation

## 2013-06-19 DIAGNOSIS — F200 Paranoid schizophrenia: Secondary | ICD-10-CM | POA: Insufficient documentation
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
## 2013-06-19 NOTE — ED Notes (Signed)
Pt back to ED room to collect belongings. Pt asked to change. Pt stated, "Im leaving because ya'll aren't doing anything for me." Patient instructed to have a seat and still refused gathering belongs and walked out.

## 2013-06-19 NOTE — ED Notes (Signed)
CALLED CARLA FOR A SITTER
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
## 2013-06-22 NOTE — ED Provider Notes (Signed)
CSN: [id number]     Arrival date & time 06/19/13  1325 History   First MD Initiated Contact with Patient 06/19/13 1448     Chief Complaint  Patient presents with  . Medical Clearance   PT not seen by provider. Left before seen.  (Consider location/radiation/quality/duration/timing/severity/associated sxs/prior Treatment) HPI  Past Medical History  Diagnosis Date  . Mental disorder   . Pregnant    Past Surgical History  Procedure Laterality Date  . Cesarean section  9 yrs ago    No family history on file. History  Substance Use Topics  . Smoking status: Current Some Day Smoker  . Smokeless tobacco: Not on file  . Alcohol Use: No   OB History   Grav Para Term Preterm Abortions TAB SAB Ect Mult Living   2 1 1       1      Review of Systems    Allergies  Pollen extract  Home Medications  No current outpatient prescriptions on file. BP 199/108  Pulse 89  Temp(Src) 97.5 F (36.4 C) (Oral)  Resp 18  SpO2 100% Physical Exam  ED Course  Procedures (including critical care time) Labs Review Labs Reviewed - No data to display Imaging Review No results found.  EKG Interpretation  None  MDM   Final diagnoses:  Schizophrenia, paranoid        Irish EldersKelly Dashonda Bonneau, NP 06/22/13 1423

## 2013-09-10 ENCOUNTER — Emergency Department (HOSPITAL_COMMUNITY)
Admission: EM | Admit: 2013-09-10 | Discharge: 2013-09-10 | Disposition: A | Discharge status: Home / Self Care | Payer: Self-pay | Attending: Emergency Medicine | Admitting: Emergency Medicine

## 2013-09-10 ENCOUNTER — Encounter (HOSPITAL_COMMUNITY): Payer: Self-pay | Admitting: Emergency Medicine

## 2013-09-10 DIAGNOSIS — F17200 Nicotine dependence, unspecified, uncomplicated: Secondary | ICD-10-CM | POA: Insufficient documentation

## 2013-09-10 DIAGNOSIS — Z113 Encounter for screening for infections with a predominantly sexual mode of transmission: Secondary | ICD-10-CM | POA: Insufficient documentation

## 2013-09-10 NOTE — ED Notes (Signed)
Pt. requesting STD check , denies dysuria / no vaginal discharge.

## 2013-09-10 NOTE — ED Notes (Signed)
Pt. Would not go into room, states we need to move the "plastic". Housekeeping equipment outside room. Pt. Refused to wait and walked out to waiting room

## 2013-09-27 ENCOUNTER — Emergency Department (HOSPITAL_COMMUNITY)
Admission: EM | Admit: 2013-09-27 | Discharge: 2013-09-27 | Disposition: A | Discharge status: Home / Self Care | Payer: Self-pay | Attending: Emergency Medicine | Admitting: Emergency Medicine

## 2013-09-27 ENCOUNTER — Encounter (HOSPITAL_COMMUNITY): Payer: Self-pay | Admitting: Emergency Medicine

## 2013-09-27 DIAGNOSIS — K6289 Other specified diseases of anus and rectum: Secondary | ICD-10-CM

## 2013-09-27 DIAGNOSIS — K602 Anal fissure, unspecified: Secondary | ICD-10-CM | POA: Insufficient documentation

## 2013-09-27 DIAGNOSIS — Z8659 Personal history of other mental and behavioral disorders: Secondary | ICD-10-CM | POA: Insufficient documentation

## 2013-09-27 DIAGNOSIS — F17200 Nicotine dependence, unspecified, uncomplicated: Secondary | ICD-10-CM | POA: Insufficient documentation

## 2013-09-27 DIAGNOSIS — Z79899 Other long term (current) drug therapy: Secondary | ICD-10-CM | POA: Insufficient documentation

## 2013-09-27 DIAGNOSIS — Z202 Contact with and (suspected) exposure to infections with a predominantly sexual mode of transmission: Secondary | ICD-10-CM | POA: Insufficient documentation

## 2013-09-27 MED ORDER — POLYETHYLENE GLYCOL 3350 17 G PO PACK
17.0000 g | PACK | Freq: Every day | ORAL | Status: DC
  Administered 2013-09-27: 17 g via ORAL
  Filled 2013-09-27: qty 1

## 2013-09-27 MED ORDER — POLYETHYLENE GLYCOL 3350 17 GM/SCOOP PO POWD
17.0000 g | Freq: Two times a day (BID) | ORAL | Status: DC
Start: 2013-09-27 — End: 2014-12-05
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
## 2013-09-27 NOTE — ED Notes (Signed)
Patient requesting OJ for medication.
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
## 2013-09-27 NOTE — ED Notes (Signed)
Pharmacy notified for med.  Patient requesting dose here.

## 2013-09-27 NOTE — ED Notes (Addendum)
Patient given medication with OJ per patient request. Patient looking at OJ and sts "oh I'm allergic to this". Patient refusing to answer further questioning. Patient sts "don't worry about it, I'll just follow up with the health center." RN reinforced resources and follow-up patient asking "why am I not being treated for an STD?" RN informed patient that she had no symptoms and sts last intercourse was 6 months per patient and last STD check was in February. Patient sts "I dont think you all know anything" and walked out of room. RN walked patient to discharge desk. Patient muttering quietly under breath. Patient in NAD, ambulatory with steady gait.
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
## 2013-09-27 NOTE — ED Provider Notes (Signed)
Medical screening examination/treatment/procedure(s) were performed by non-physician practitioner and as supervising physician I was immediately available for consultation/collaboration.   EKG Interpretation None        Candyce Churn III, MD 09/27/13 2035

## 2013-09-27 NOTE — Discharge Instructions (Signed)
Anal Fissure, Adult  An anal fissure is a small tear or crack in the skin around the anus. Bleeding from a fissure usually stops on its own within a few minutes. However, bleeding will often reoccur with each bowel movement until the crack heals.   CAUSES    Passing large, hard stools.   Frequent diarrheal stools.   Constipation.   Inflammatory bowel disease (Crohn's disease or ulcerative colitis).   Infections.   Anal sex.  SYMPTOMS    Small amounts of blood seen on your stools, on toilet paper, or in the toilet after a bowel movement.   Rectal bleeding.   Painful bowel movements.   Itching or irritation around the anus.  DIAGNOSIS  Your caregiver will examine the anal area. An anal fissure can usually be seen with careful inspection. A rectal exam may be performed and a short tube (anoscope) may be used to examine the anal canal.  TREATMENT    You may be instructed to take fiber supplements. These supplements can soften your stool to help make bowel movements easier.   Sitz baths may be recommended to help heal the tear. Do not use soap in the sitz baths.   A medicated cream or ointment may be prescribed to lessen discomfort.  HOME CARE INSTRUCTIONS    Maintain a diet high in fruits, whole grains, and vegetables. Avoid constipating foods like bananas and dairy products.   Take sitz baths as directed by your caregiver.   Drink enough fluids to keep your urine clear or pale yellow.   Only take over-the-counter or prescription medicines for pain, discomfort, or fever as directed by your caregiver. Do not take aspirin as this may increase bleeding.   Do not use ointments containing numbing medications (anesthetics) or hydrocortisone. They could slow healing.  SEEK MEDICAL CARE IF:    Your fissure is not completely healed within 3 days.   You have further bleeding.   You have a fever.   You have diarrhea mixed with blood.   You have pain.   Your problem is getting worse rather than  better.  MAKE SURE YOU:    Understand these instructions.   Will watch your condition.   Will get help right away if you are not doing well or get worse.  Document Released: 04/12/2005 Document Revised: 07/05/2011 Document Reviewed: 09/27/2010  [employer] Patient Information 2014 [employer].

## 2014-02-25 ENCOUNTER — Encounter (HOSPITAL_COMMUNITY): Payer: Self-pay | Admitting: Emergency Medicine

## 2014-06-04 IMAGING — US US OB LIMITED
1 series · 14 of 15 positions shown · non-contrast
Comparison: none

CLINICAL DATA: Evaluate for viability and fetal age.  The patient
has schizophrenia and is uncooperative.

LIMITED OBSTETRIC ULTRASOUND
Number of Fetuses: 1
Heart Rate: 772bpm
Movement:  Present
Presentation: Cephalic
Placental Location: Anterior/superior
Amniotic Fluid (Subjective): Normal
BPD:  8.7cm   34w    6d
MATERNAL FINDINGS:
Cervix:  Not visualized.

[Series 1: us ob limited · 0.31mm/px · 14 of 15 slices shown]
[im 1/15]
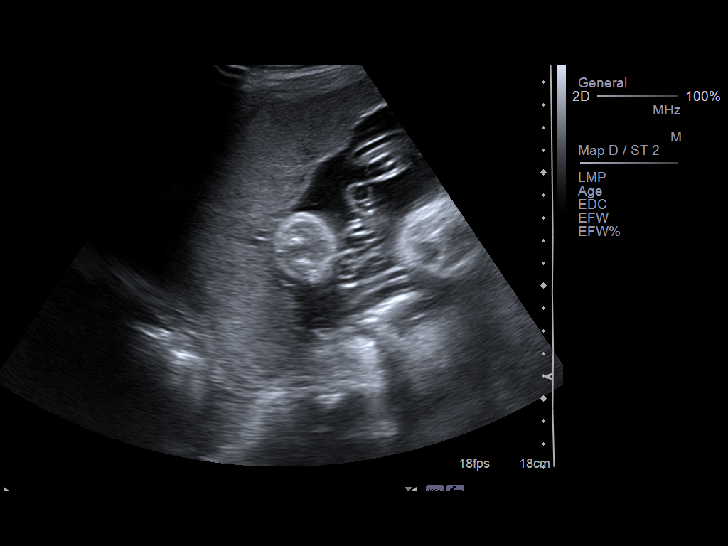
[im 2/15]
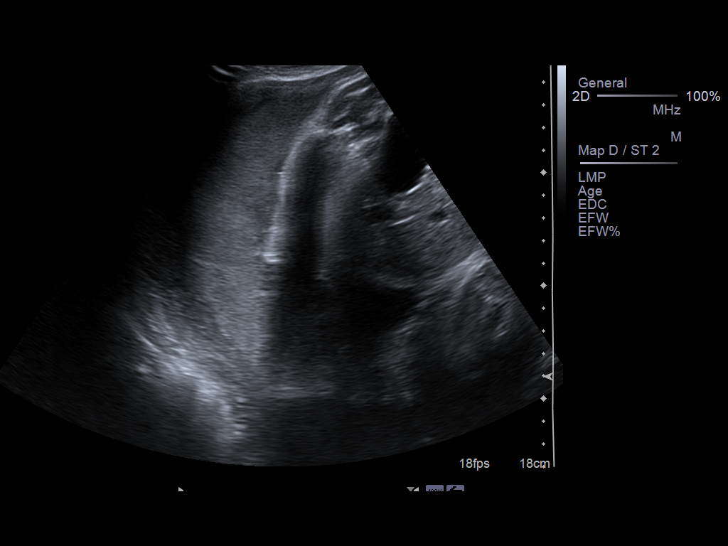
[im 3/15]
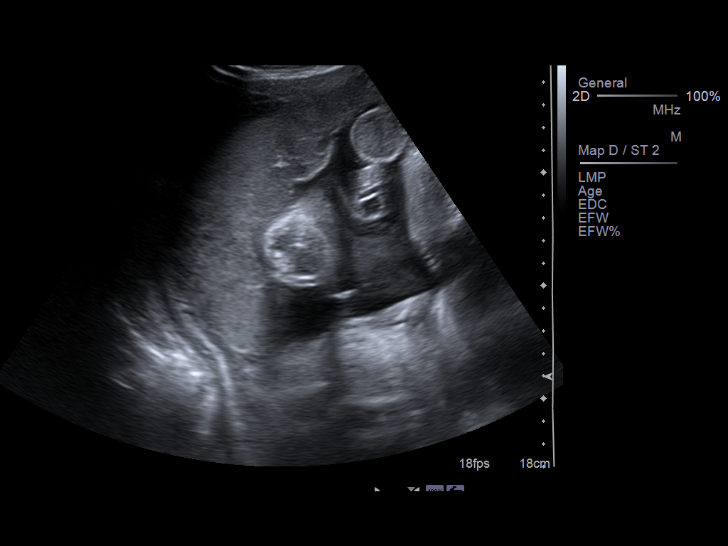
[im 4/15]
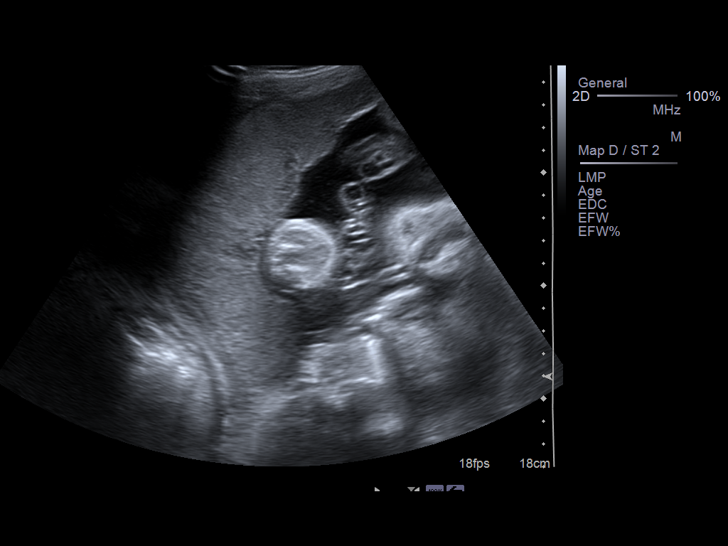
[im 5/15]
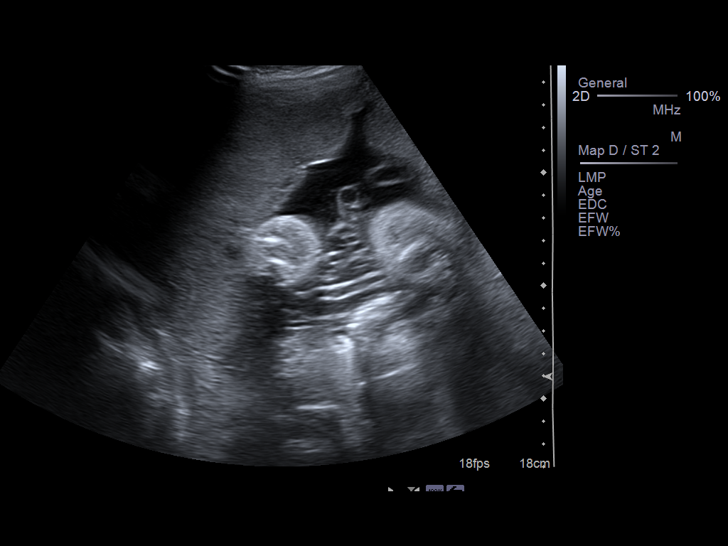
[im 6/15]
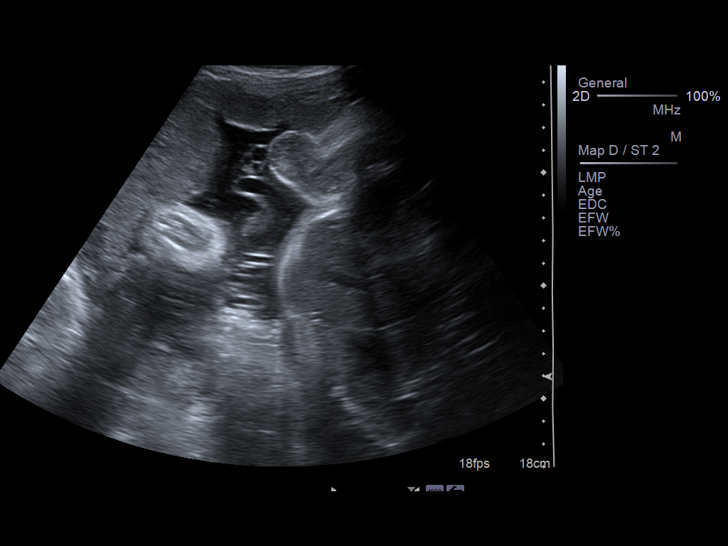
[im 7/15]
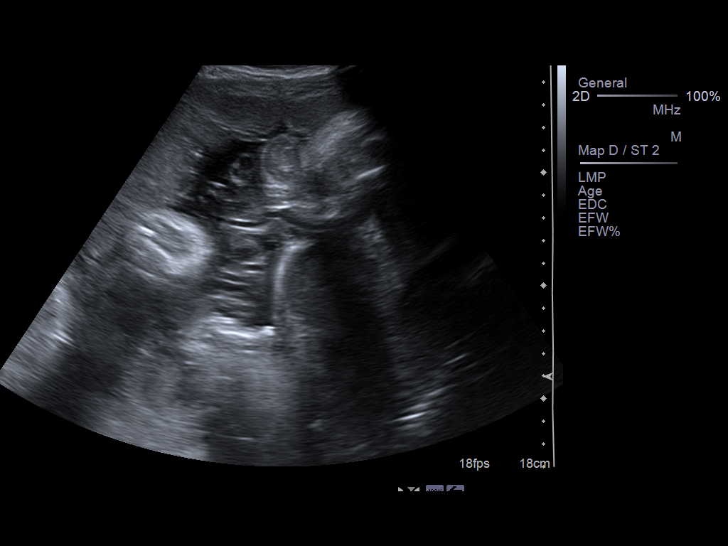
[im 9/15]
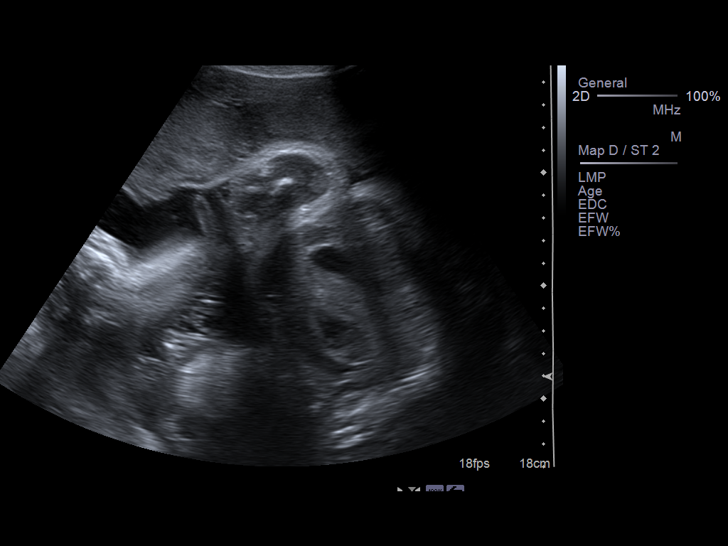
[im 10/15]
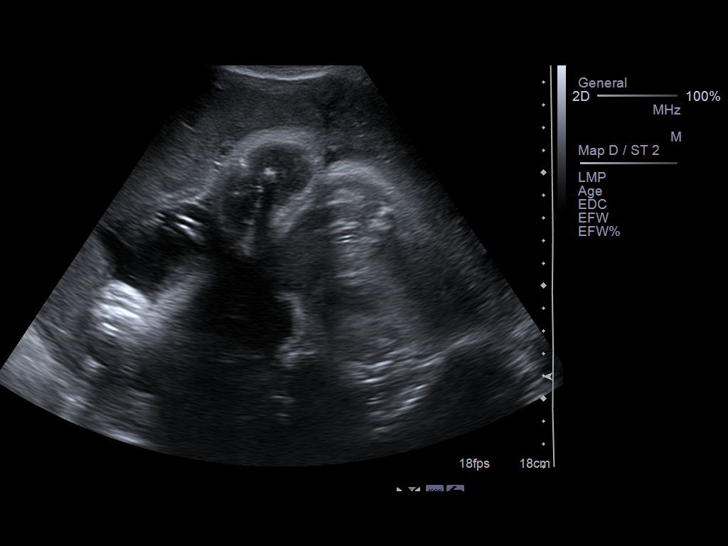
[im 11/15]
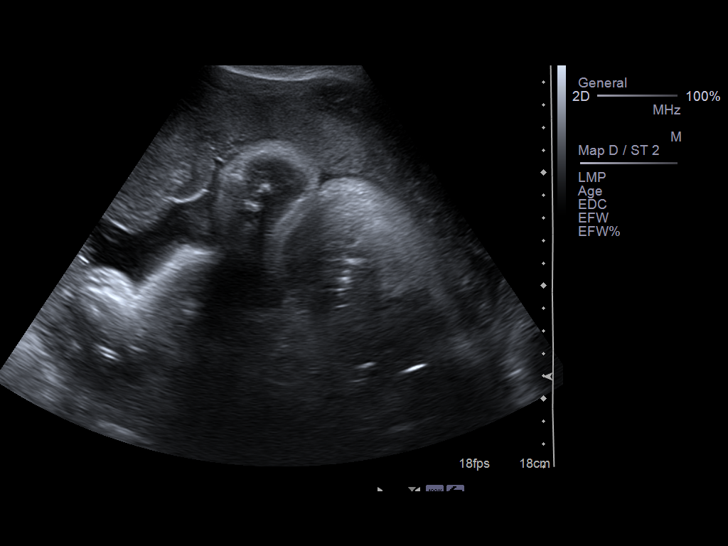
[im 12/15]
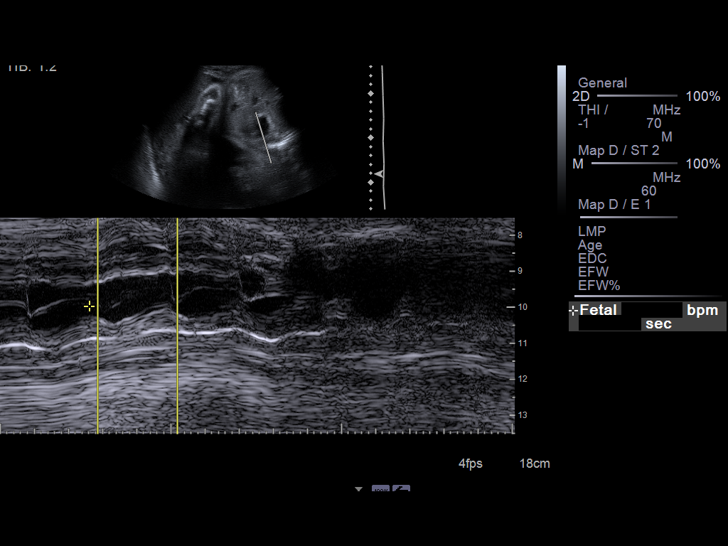
[im 13/15]
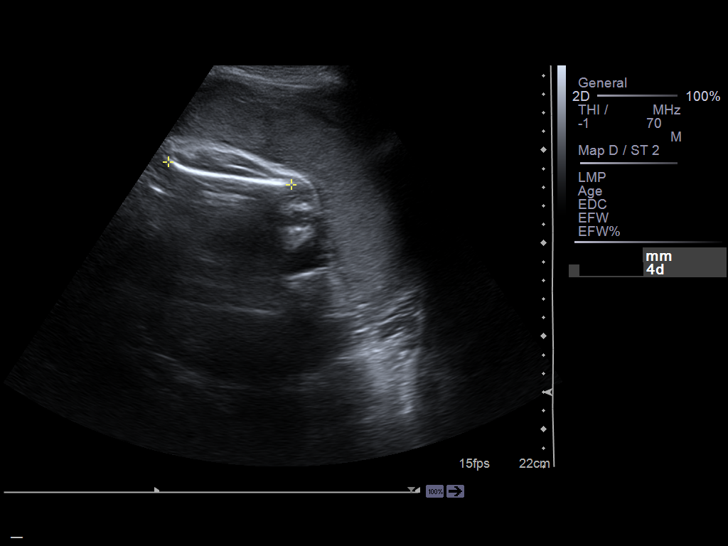
[im 14/15]
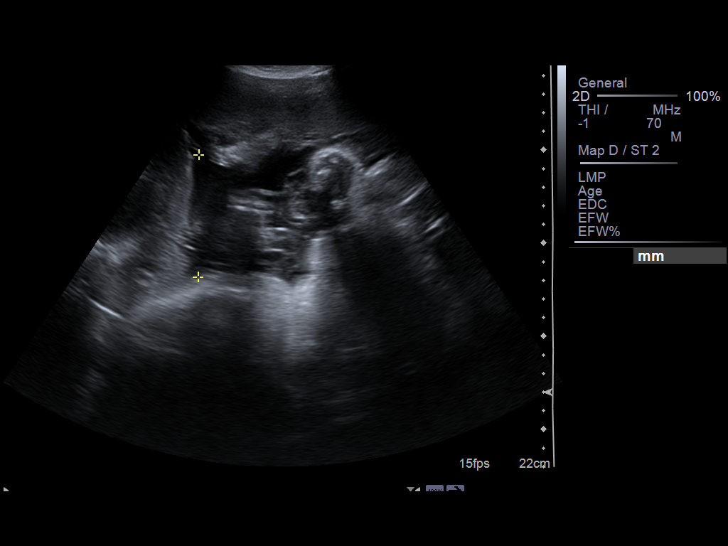
[im 15/15]
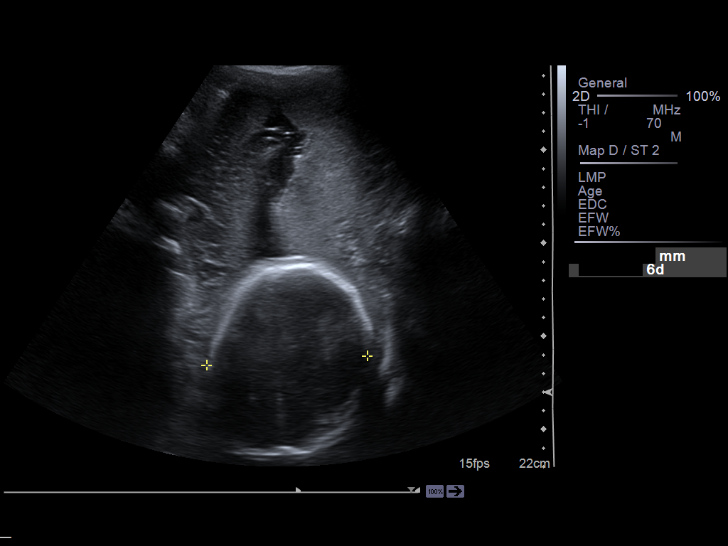

[14 of 15 positions shown; findings below may reference images not displayed]

IMPRESSION: Intrauterine pregnancy of approximately 34 weeks 6 days with fetal
heart rate of 128 beats per minute.

Exam degraded by patient clinical status and lack of cooperation.

This exam is performed on an emergent basis and does not
comprehensively evaluate fetal size, dating, or anatomy, and a
follow-up complete OB US should be considered if further fetal
assessment is warranted.

## 2014-06-08 IMAGING — US US OB COMP +14 WK
1 series · 12 of 28 positions shown · non-contrast
Comparison: none

[Series 1: us ob comp +14 wk · 35 acquisitions, 12 frames shown]
[im 2/35]
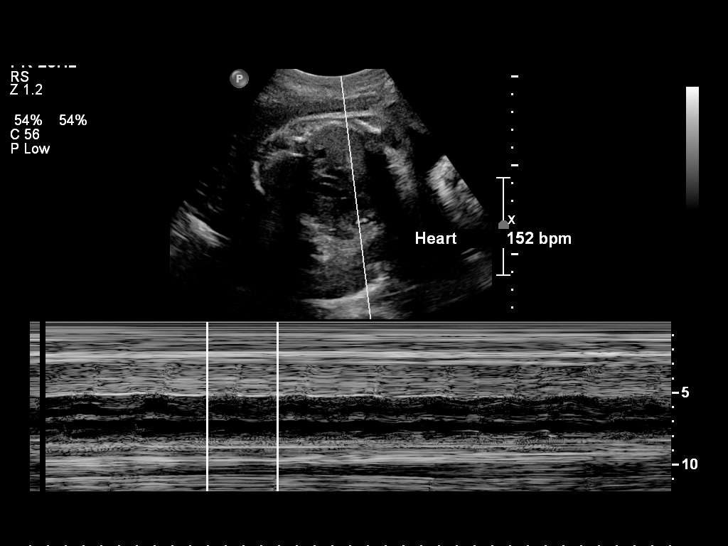
[im 4/35]
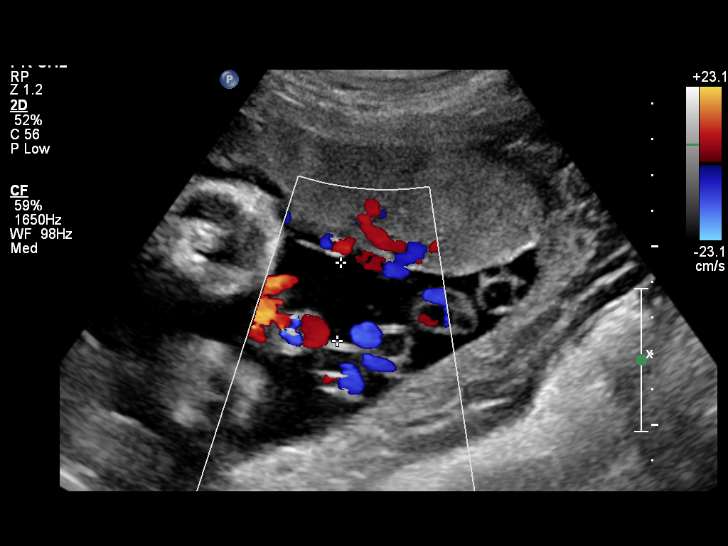
[im 7/35]
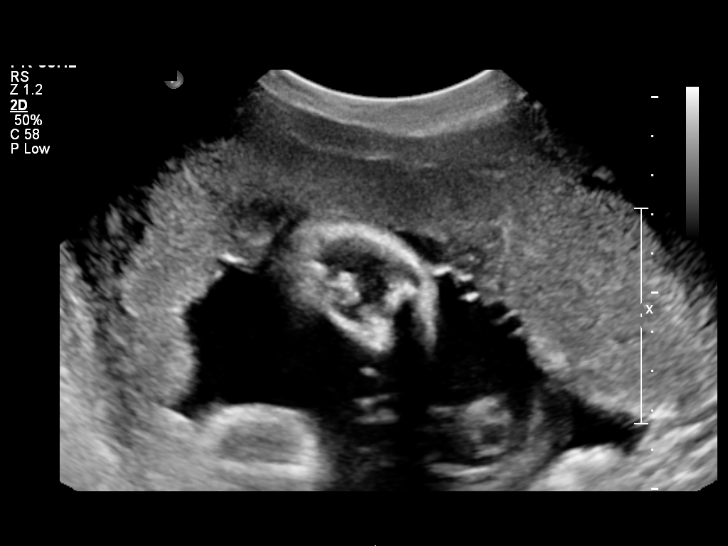
[im 11/35]
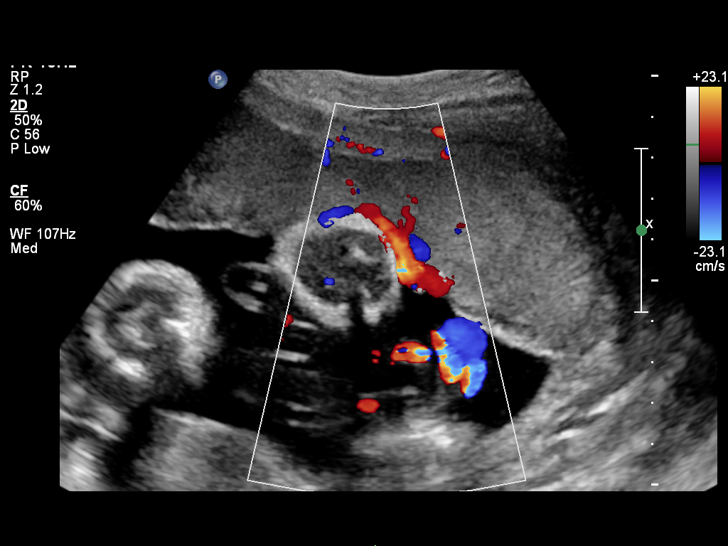
[im 13/35]
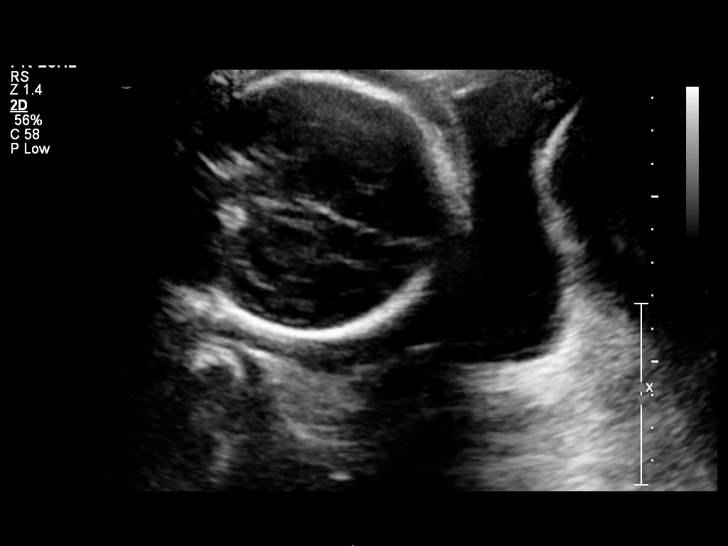
[im 16/35]
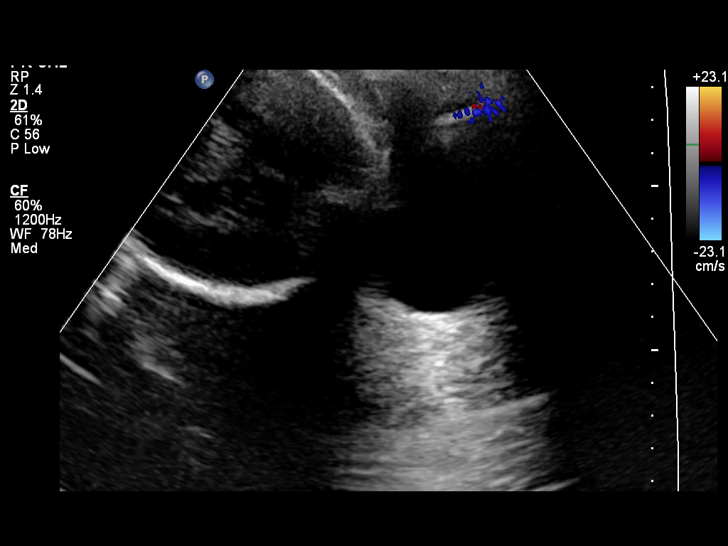
[im 19/35]
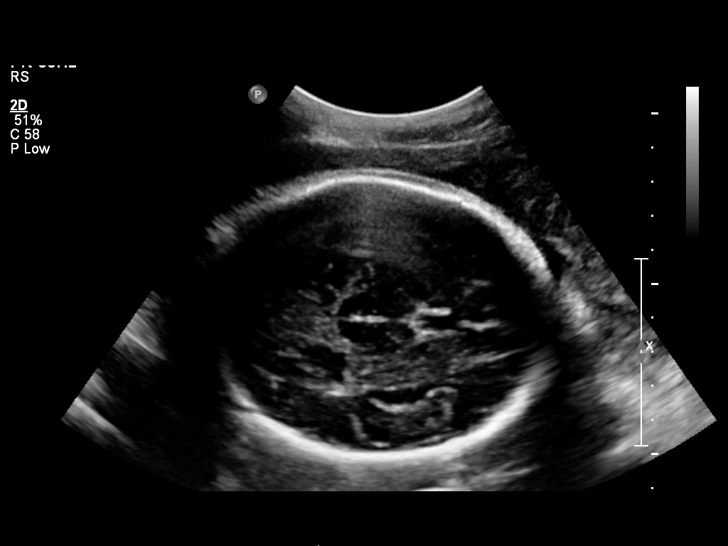
[im 22/35]
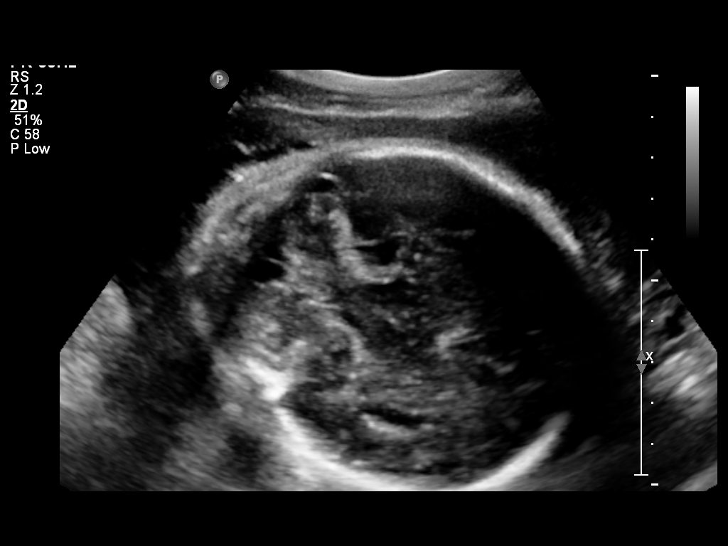
[im 24/35]
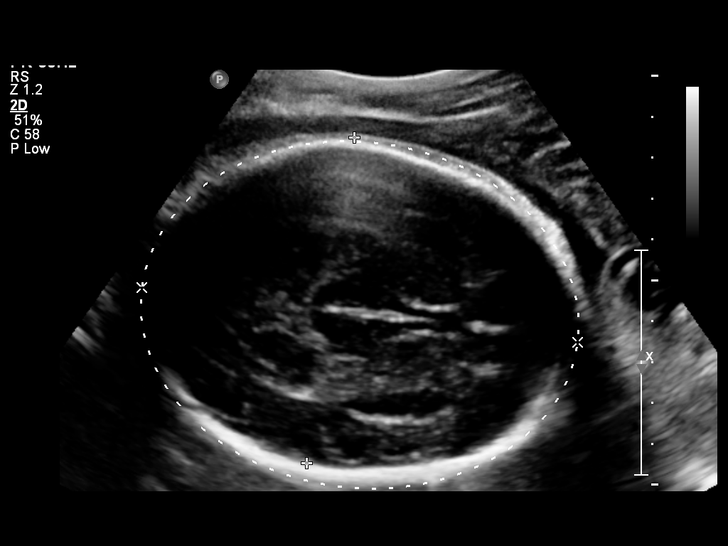
[im 28/35]
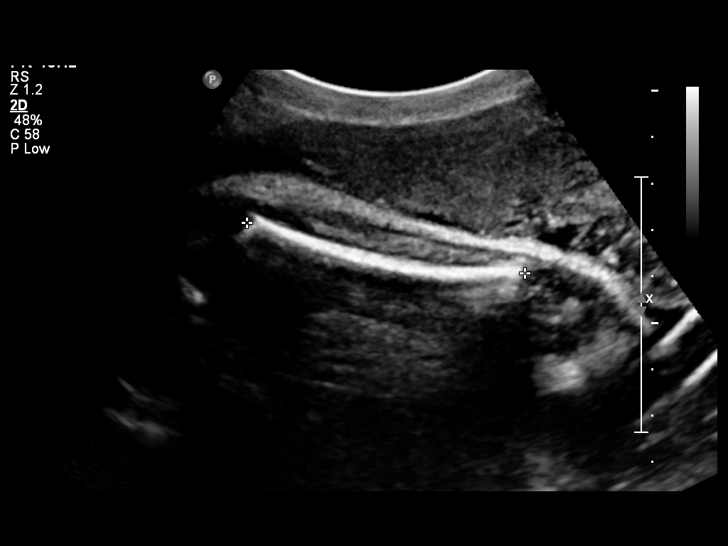
[im 31/35]
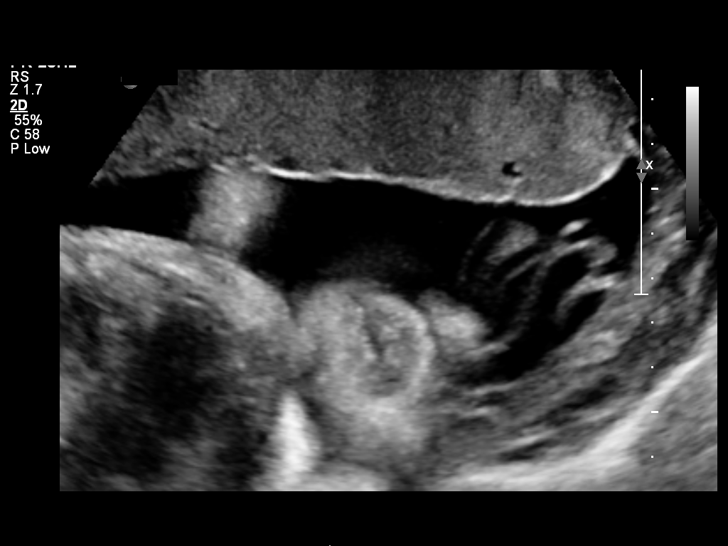
[im 33/35]
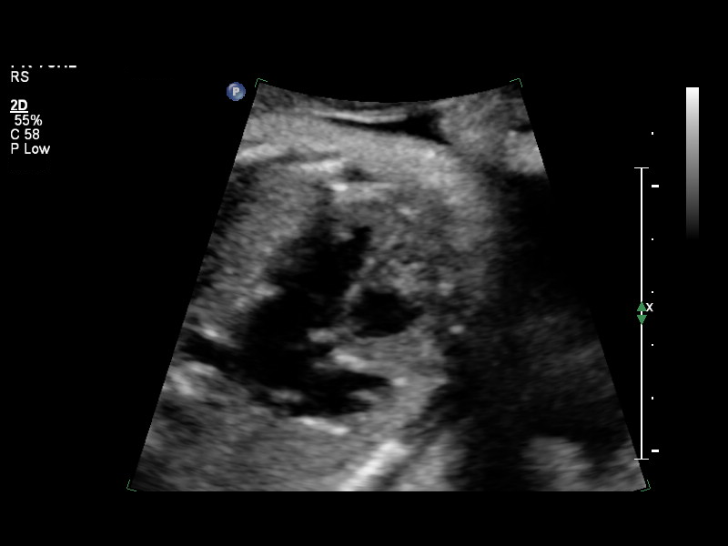

[12 of 28 positions shown; findings below may reference images not displayed]

OBSTETRICS REPORT
                      (Signed Final 10/24/2012 [DATE])

Service(s) Provided

 US OB COMP + 14 WK                                    76805.1
Indications

 No or Little Prenatal Care
 Mental disorder
 Previous cesarean section
Fetal Evaluation

 Num Of Fetuses:    1
 Fetal Heart Rate:  152                         bpm
 Cardiac Activity:  Observed
 Presentation:      Cephalic
 Placenta:          Anterior, above cervical os
 P. Cord            Visualized, central
 Insertion:

 Amniotic Fluid
 AFI FV:      Subjectively within normal limits
 AFI Sum:     12.78   cm      39   %Tile     Larg Pckt:   5.06   cm
 RUQ:   2.38   cm    RLQ:    5.06   cm    LUQ:   3.16    cm   LLQ:    2.18   cm
Biometry

 BPD:     80.4  mm    G. Age:   32w 2d                CI:        71.01   70 - 86
                                                      FL/HC:      20.4   19.9 -

 HC:       304  mm    G. Age:   33w 6d       34  %    HC/AC:      1.02   0.96 -

 AC:     297.8  mm    G. Age:   33w 5d       73  %    FL/BPD:     77.1   71 - 87
 FL:        62  mm    G. Age:   32w 1d       19  %    FL/AC:      20.8   20 - 24

 Est. FW:    4555  gm    4 lb 11 oz      61  %
Gestational Age

 U/S Today:     33w 0d                                        EDD:   12/12/12
 Best:          33w 0d    Det. By:   U/S (10/24/12)           EDD:   12/12/12
Anatomy

 Cranium:          Previously seen        Aortic Arch:      Basic anatomy
                                                            exam per order
 Fetal Cavum:      Previously seen        Ductal Arch:      Basic anatomy
                                                            exam per order
 Ventricles:       Not well visualized    Diaphragm:        Not well visualized
 Choroid Plexus:   Appears normal         Stomach:          Appears normal, left
                                                            sided
 Cerebellum:       Appears normal         Abdomen:          Not well visualized
 Posterior Fossa:  Appears normal         Abdominal Wall:   Not well visualized
 Nuchal Fold:      Not applicable (>20    Cord Vessels:     Not well visualized
                   wks GA)
 Face:             Basic anatomy          Kidneys:          Not well visualized
                   exam per order
 Lips:             Appears normal         Bladder:          Not well visualized
 Heart:            Appears normal         Spine:            Not well visualized
                   (4CH, axis, and
                   situs)
 RVOT:             Not well visualized    Lower             Not well visualized
                                          Extremities:
 LVOT:             Not well visualized    Upper             Not well visualized
                                          Extremities:

 Other:  Fetus appears to be a male. Technically difficult due to fetal position.
         Pt would not coorporate to do a complete anatomy scan.
Cervix Uterus Adnexa

 Cervical Length:   3.96      cm

 Cervix:       Normal appearance by transabdominal scan.
 Left Ovary:   Within normal limits.
 Right Ovary:  Within normal limits.

 Adnexa:     No abnormality visualized.
Impression

 Single living IUP with US Gest. Age of 33w 0d, and EDD of
 12/12/2012.
 Technically challenging examination as the patient requested
 that the ultrasound be terminated early. No fetal anatomic
 abnormality is identified. Limited by advanced GA.
 Normal amniotic fluid volume and cervical length.

## 2014-07-01 ENCOUNTER — Encounter (HOSPITAL_COMMUNITY): Payer: Self-pay

## 2014-07-01 ENCOUNTER — Emergency Department (HOSPITAL_COMMUNITY)
Admission: EM | Admit: 2014-07-01 | Discharge: 2014-07-01 | Disposition: A | Discharge status: Home / Self Care | Payer: Self-pay | Attending: Emergency Medicine | Admitting: Emergency Medicine

## 2014-07-01 DIAGNOSIS — N898 Other specified noninflammatory disorders of vagina: Secondary | ICD-10-CM | POA: Insufficient documentation

## 2014-07-01 DIAGNOSIS — Z3202 Encounter for pregnancy test, result negative: Secondary | ICD-10-CM | POA: Insufficient documentation

## 2014-07-01 DIAGNOSIS — Z8659 Personal history of other mental and behavioral disorders: Secondary | ICD-10-CM | POA: Insufficient documentation

## 2014-07-01 DIAGNOSIS — R102 Pelvic and perineal pain: Secondary | ICD-10-CM | POA: Insufficient documentation

## 2014-07-01 DIAGNOSIS — L293 Anogenital pruritus, unspecified: Secondary | ICD-10-CM | POA: Insufficient documentation

## 2014-07-01 DIAGNOSIS — G8929 Other chronic pain: Secondary | ICD-10-CM | POA: Insufficient documentation

## 2014-07-01 DIAGNOSIS — Z720 Tobacco use: Secondary | ICD-10-CM | POA: Insufficient documentation

## 2014-07-01 LAB — COMPREHENSIVE METABOLIC PANEL
ALT: 11 U/L (ref 0–35)
AST: 20 U/L (ref 0–37)
Albumin: 4.1 g/dL (ref 3.5–5.2)
Alkaline Phosphatase: 70 U/L (ref 39–117)
Anion gap: 8 (ref 5–15)
BILIRUBIN TOTAL: 0.8 mg/dL (ref 0.3–1.2)
BUN: 12 mg/dL (ref 6–23)
CHLORIDE: 104 mmol/L (ref 96–112)
CO2: 26 mmol/L (ref 19–32)
Calcium: 9.4 mg/dL (ref 8.4–10.5)
Creatinine, Ser: 0.84 mg/dL (ref 0.50–1.10)
GFR, EST NON AFRICAN AMERICAN: 85 mL/min — AB (ref >90)
Glucose, Bld: 112 mg/dL — ABNORMAL HIGH (ref 70–99)
Potassium: 4 mmol/L (ref 3.5–5.1)
SODIUM: 138 mmol/L (ref 135–145)
Total Protein: 7.7 g/dL (ref 6.0–8.3)

## 2014-07-01 LAB — CBC
HCT: 37.6 % (ref 36.0–46.0)
Hemoglobin: 12.6 g/dL (ref 12.0–15.0)
MCH: 29.3 pg (ref 26.0–34.0)
MCHC: 33.5 g/dL (ref 30.0–36.0)
MCV: 87.4 fL (ref 78.0–100.0)
PLATELETS: 270 10*3/uL (ref 150–400)
RBC: 4.3 MIL/uL (ref 3.87–5.11)
RDW: 13.8 % (ref 11.5–15.5)
WBC: 4.6 10*3/uL (ref 4.0–10.5)

## 2014-07-01 LAB — RAPID URINE DRUG SCREEN, HOSP PERFORMED
AMPHETAMINES: NOT DETECTED
BENZODIAZEPINES: NOT DETECTED
Barbiturates: NOT DETECTED
COCAINE: NOT DETECTED
Opiates: NOT DETECTED
Tetrahydrocannabinol: NOT DETECTED

## 2014-07-01 LAB — URINALYSIS, ROUTINE W REFLEX MICROSCOPIC
Bilirubin Urine: NEGATIVE
Glucose, UA: NEGATIVE mg/dL
HGB URINE DIPSTICK: NEGATIVE
Ketones, ur: NEGATIVE mg/dL
LEUKOCYTES UA: NEGATIVE
Nitrite: NEGATIVE
PH: 5.5 (ref 5.0–8.0)
Protein, ur: NEGATIVE mg/dL
Specific Gravity, Urine: 1.028 (ref 1.005–1.030)
Urobilinogen, UA: 0.2 mg/dL (ref 0.0–1.0)

## 2014-07-01 LAB — POC URINE PREG, ED: Preg Test, Ur: NEGATIVE

## 2014-07-01 LAB — ACETAMINOPHEN LEVEL: Acetaminophen (Tylenol), Serum: <10 ug/mL — ABNORMAL LOW (ref 10–30)

## 2014-07-01 LAB — SALICYLATE LEVEL: Salicylate Lvl: <4 mg/dL (ref 2.8–20.0)

## 2014-07-01 LAB — VALPROIC ACID LEVEL

## 2014-07-01 NOTE — ED Notes (Signed)
Pt. Reports here for a STD check and to get the "razor blades out of my stomach".

## 2014-07-01 NOTE — ED Notes (Signed)
Pt refused to stay until PA cameron could write up her d/c papers. Pt refused to sign AMA papers and Eloped

## 2014-07-01 NOTE — ED Notes (Signed)
MD at bedside. 

## 2014-07-01 NOTE — Discharge Instructions (Signed)
You presented to the ED for evaluation of a sexually transmitted infection. Because you are unwilling to let us perform a pelvic exam, we are not sure if you may have an infection. This is an important part of your evaluation. Please return to the ED immediately if you would like to pursue further work-up. Otherwise, present to your Health Department for testing.    Abdominal Pain, Women Abdominal (stomach, pelvic, or belly) pain can be caused by many things. It is important to tell your doctor:  The location of the pain.  Does it come and go or is it present all the time?  Are there things that start the pain (eating certain foods, exercise)?  Are there other symptoms associated with the pain (fever, nausea, vomiting, diarrhea)? All of this is helpful to know when trying to find the cause of the pain. CAUSES   Stomach: virus or bacteria infection, or ulcer.  Intestine: appendicitis (inflamed appendix), regional ileitis (Crohn's disease), ulcerative colitis (inflamed colon), irritable bowel syndrome, diverticulitis (inflamed diverticulum of the colon), or cancer of the stomach or intestine.  Gallbladder disease or stones in the gallbladder.  Kidney disease, kidney stones, or infection.  Pancreas infection or cancer.  Fibromyalgia (pain disorder).  Diseases of the female organs:  Uterus: fibroid (non-cancerous) tumors or infection.  Fallopian tubes: infection or tubal pregnancy.  Ovary: cysts or tumors.  Pelvic adhesions (scar tissue).  Endometriosis (uterus lining tissue growing in the pelvis and on the pelvic organs).  Pelvic congestion syndrome (female organs filling up with blood just before the menstrual period).  Pain with the menstrual period.  Pain with ovulation (producing an egg).  Pain with an IUD (intrauterine device, birth control) in the uterus.  Cancer of the female organs.  Functional pain (pain not caused by a disease, may improve without  treatment).  Psychological pain.  Depression. DIAGNOSIS  Your doctor will decide the seriousness of your pain by doing an examination.  Blood tests.  X-rays.  Ultrasound.  CT scan (computed tomography, special type of X-ray).  MRI (magnetic resonance imaging).  Cultures, for infection.  Barium enema (dye inserted in the large intestine, to better view it with X-rays).  Colonoscopy (looking in intestine with a lighted tube).  Laparoscopy (minor surgery, looking in abdomen with a lighted tube).  Major abdominal exploratory surgery (looking in abdomen with a large incision). TREATMENT  The treatment will depend on the cause of the pain.   Many cases can be observed and treated at home.  Over-the-counter medicines recommended by your caregiver.  Prescription medicine.  Antibiotics, for infection.  Birth control pills, for painful periods or for ovulation pain.  Hormone treatment, for endometriosis.  Nerve blocking injections.  Physical therapy.  Antidepressants.  Counseling with a psychologist or psychiatrist.  Minor or major surgery. HOME CARE INSTRUCTIONS   Do not take laxatives, unless directed by your caregiver.  Take over-the-counter pain medicine only if ordered by your caregiver. Do not take aspirin because it can cause an upset stomach or bleeding.  Try a clear liquid diet (broth or water) as ordered by your caregiver. Slowly move to a bland diet, as tolerated, if the pain is related to the stomach or intestine.  Have a thermometer and take your temperature several times a day, and record it.  Bed rest and sleep, if it helps the pain.  Avoid sexual intercourse, if it causes pain.  Avoid stressful situations.  Keep your follow-up appointments and tests, as your caregiver orders.  If the pain does not go away with medicine or surgery, you may try:  Acupuncture.  Relaxation exercises (yoga, meditation).  Group therapy.  Counseling. SEEK  MEDICAL CARE IF:   You notice certain foods cause stomach pain.  Your home care treatment is not helping your pain.  You need stronger pain medicine.  You want your IUD removed.  You feel faint or lightheaded.  You develop nausea and vomiting.  You develop a rash.  You are having side effects or an allergy to your medicine. SEEK IMMEDIATE MEDICAL CARE IF:   Your pain does not go away or gets worse.  You have a fever.  Your pain is felt only in portions of the abdomen. The right side could possibly be appendicitis. The left lower portion of the abdomen could be colitis or diverticulitis.  You are passing blood in your stools (bright red or black tarry stools, with or without vomiting).  You have blood in your urine.  You develop chills, with or without a fever.  You pass out. MAKE SURE YOU:   Understand these instructions.  Will watch your condition.  Will get help right away if you are not doing well or get worse. Document Released: 02/07/2007 Document Revised: 08/27/2013 Document Reviewed: 02/27/2009 [facility] Patient Information 2015 [place], [place]. This information is not intended to replace advice given to you by your health care provider. Make sure you discuss any questions you have with your health care provider.

## 2014-07-01 NOTE — ED Notes (Signed)
Pt states she cant wait to get pelvic exam done and needs to leave. Dr. Preston FleetingGlick made aware

## 2014-07-01 NOTE — ED Notes (Signed)
Pelvic Cart at bedside 

## 2014-07-01 NOTE — ED Provider Notes (Signed)
CSN: [id number]     Arrival date & time 07/01/14  1447 History   First MD Initiated Contact with Patient 07/01/14 1713     Chief Complaint  Patient presents with  . SEXUALLY TRANSMITTED DISEASE  . Paranoid     (Consider location/radiation/quality/duration/timing/severity/associated sxs/prior Treatment) HPI   44 year old female with past medical history of schizophrenia with chronic delusions as well as history of recurrent visits for pelvic pain and STD checks, who presents for evaluation of STD check. The patient states that she is here today because she has noticed mild vaginal irritation several days ago. This irritation has completely resolved and she is asymptomatic currently. However, she is sexually active without protection and subsequently presents for evaluation. She states she was just evaluated at the health department for these symptoms and was told that her tests were negative, but she does not believe them. She denies any vaginal bleeding or discharge. Denies any dysuria or urinary frequency. No hematuria. She has chronic pelvic pain that she describes as sharp, suprapubic pains that are intermittent. Denies any known alleviating or aggravating factors. Of note, the patient states that her pains are due to "razor blades" that she has swallowed in the past. She has been worked up for this previously and the "x-rays are always negative." When asked whether she was told this was not true by her psychiatrist, she refuses to answer. Denies any active visual or auditory hallucinations. Denies any SI, HI, or AVH. She states her primary reason for this visit is STD check, and that her other problems have been "going on for years." She states she has been compliant with her medications. No recent head trauma.  Past Medical History  Diagnosis Date  . Mental disorder   . Pregnant    Past Surgical History  Procedure Laterality Date  . Cesarean section  9 yrs ago    No family history on  file. History  Substance Use Topics  . Smoking status: Current Some Day Smoker  . Smokeless tobacco: Not on file  . Alcohol Use: No   OB History    Gravida Para Term Preterm AB TAB SAB Ectopic Multiple Living   Review of Systems  Constitutional: Negative for fever and chills.  HENT: Negative for congestion, rhinorrhea and sore throat.   Eyes: Negative for visual disturbance.  Respiratory: Negative for cough, shortness of breath and wheezing.   Cardiovascular: Negative for chest pain and leg swelling.  Gastrointestinal: Negative for nausea, vomiting, abdominal pain and diarrhea.  Genitourinary: Positive for vaginal discharge (Transient, now resolved) and pelvic pain. Negative for vaginal bleeding and vaginal pain.  Musculoskeletal: Negative for neck pain and neck stiffness.  Skin: Negative for rash.  Neurological: Negative for dizziness, weakness, light-headedness and headaches.      Allergies  Pollen extract  Home Medications   Prior to Admission medications   Medication Sig Start Date End Date Taking? Authorizing Provider  polyethylene glycol powder (GLYCOLAX/MIRALAX) powder Take 17 g by mouth 2 (two) times daily. Until daily soft stools  OTC Patient not taking: Reported on 07/01/2014 09/27/13   Roxy Horseman, PA-C   BP 167/105 mmHg  Pulse 60  Temp(Src) 98.9 F (37.2 C) (Oral)  Resp 17  SpO2 100%  LMP 06/02/2014 Physical Exam  Constitutional: She is oriented to person, place, and time. She appears well-developed and well-nourished. No distress.  HENT:  Head: Normocephalic and atraumatic.  Mouth/Throat: No  oropharyngeal exudate.  Eyes: Conjunctivae are normal. Pupils are equal, round, and reactive to light.  Neck: Neck supple.  Cardiovascular: Normal rate, normal heart sounds and intact distal pulses.  Exam reveals no friction rub.   No murmur heard. Pulmonary/Chest: Effort normal and breath sounds normal. No respiratory distress. She has no  wheezes. She has no rales.  Abdominal: Soft. Bowel sounds are normal. She exhibits no distension. There is no tenderness. There is no rebound and no guarding.  Musculoskeletal: She exhibits no edema.  Neurological: She is alert and oriented to person, place, and time.  Skin: Skin is warm. No rash noted.  Psychiatric: She has a normal mood and affect. Her speech is normal and behavior is normal. She is not agitated and not aggressive. Cognition and memory are not impaired. She does not express impulsivity or inappropriate judgment. She expresses no homicidal and no suicidal ideation.  Nursing note and vitals reviewed.   ED Course  Procedures (including critical care time) Labs Review Labs Reviewed  COMPREHENSIVE METABOLIC PANEL - Abnormal; Notable for the following:    Glucose, Bld 112 (*)    GFR calc non Af Amer 85 (*)    All other components within normal limits  VALPROIC ACID LEVEL - Abnormal; Notable for the following:    Valproic Acid Lvl <10.0 (*)    All other components within normal limits  ACETAMINOPHEN LEVEL - Abnormal; Notable for the following:    Acetaminophen (Tylenol), Serum <10.0 (*)    All other components within normal limits  WET PREP, GENITAL  URINALYSIS, ROUTINE W REFLEX MICROSCOPIC  CBC  URINE RAPID DRUG SCREEN (HOSP PERFORMED)  SALICYLATE LEVEL  RPR  HIV ANTIBODY (ROUTINE TESTING)  POC URINE PREG, ED  GC/CHLAMYDIA PROBE AMP ([place])    Imaging Review No results found.   EKG Interpretation None      MDM   44 year old female with past medical history of schizophrenia with chronic delusions as well as history of recurrent visits for pelvic pain and STD checks, who presents for evaluation of STD check. See HPi above. On arrival, T 98.42F, HR 66, RR 18, BP 171/97, satting 100% on RA. Exam as above, pt overall well-appearing and in NAD.  Regarding her c/o STD, she had recent negative w/u at [facility] department but given complaints, although transient,  will obtain wet prep, GC/C, HIV, RPR/STD panel. Pt in agreement with this. Her abdomen is completley soft, NT, ND, with no fever, no current vaginal sx and do not suspect PID, TOA at this time. No signs of systemic illness. Denies any current vaginal irritation to suggest ongoing yeast or BV infection, but will test for on wet prep. Will also send screening labs. Regarding her c/o chronic pelvic pain, will send UA to eval for UTI. Pt does endorse "razor blades" in her stomach causing the pain but states she has thought this for years, and this is likely a manifestation of her chronic, stable schizophrenia/mental health disorder. She is not currently RTIS, is appropriate and calm on my interview, and has endorsed paranoid delusions persistently and chronically in the past. She denies any current SI, HI, AVH, and does not meet IVC criteria at this time. Will advise her to f/u with her psychiatrist for this.  Screening labs sent. Patient asked to take pants off under gown for pelvic exam. UA negative. Will plan to perform pelvic.  Upon entering room to perform pelvic exam, patient dressed and stating she has to leave. I discussed that  without performing a pelvic exam, we would be unable to test for STDs as well as assess for any reasons/etiologies for her pelvic pain. She expressed understanding and states she will accept the risk, including understanding that this may include worsening infection and death. Given her alert oriented status, with no intoxication, and ability to fully express the consequences of not performing an exam - including her understanding that an infection could be missed - I feel the patient has capacity to make this decision. Upon returning to room to provide her with papers encouraging her to return, she had eloped. Given her otherwise stable vitals, normal labs as above, and stable psychiatric condition, do not feel IVC merited.   Labs reviewed. UPT/UA neg. APAP, salicylate negative.  CBC with no leukocytosis. CMP unremarkable.   Clinical Impression: 1. Chronic pelvic pain in female     Disposition: Eloped  Condition: Good  At seen in conjunction with Dr. Regina EckGlick   Joss Mcdill, MD 07/02/14 [id number]  Dione Boozeavid Glick, MD 07/02/14 [phone]

## 2014-07-02 LAB — HIV ANTIBODY (ROUTINE TESTING W REFLEX): HIV SCREEN 4TH GENERATION: NONREACTIVE

## 2014-07-02 LAB — RPR: RPR: NONREACTIVE

## 2014-07-03 ENCOUNTER — Telehealth (HOSPITAL_BASED_OUTPATIENT_CLINIC_OR_DEPARTMENT_OTHER): Payer: Self-pay | Admitting: Emergency Medicine

## 2014-07-23 ENCOUNTER — Telehealth (HOSPITAL_COMMUNITY): Payer: Self-pay

## 2014-07-31 ENCOUNTER — Encounter (HOSPITAL_COMMUNITY): Payer: Self-pay

## 2014-07-31 ENCOUNTER — Emergency Department (HOSPITAL_COMMUNITY)
Admission: EM | Admit: 2014-07-31 | Discharge: 2014-08-01 | Disposition: A | Discharge status: Other Acute Inpatient Hospital | Payer: Self-pay | Attending: Emergency Medicine | Admitting: Emergency Medicine

## 2014-07-31 DIAGNOSIS — Z720 Tobacco use: Secondary | ICD-10-CM | POA: Insufficient documentation

## 2014-07-31 DIAGNOSIS — F200 Paranoid schizophrenia: Secondary | ICD-10-CM | POA: Insufficient documentation

## 2014-07-31 DIAGNOSIS — F29 Unspecified psychosis not due to a substance or known physiological condition: Secondary | ICD-10-CM | POA: Insufficient documentation

## 2014-07-31 HISTORY — DX: Essential (primary) hypertension: I10

## 2014-07-31 LAB — CBC WITH DIFFERENTIAL/PLATELET
Basophils Absolute: 0 10*3/uL (ref 0.0–0.1)
Basophils Relative: 1 % (ref 0–1)
EOS ABS: 0.1 10*3/uL (ref 0.0–0.7)
Eosinophils Relative: 3 % (ref 0–5)
HCT: 38.8 % (ref 36.0–46.0)
HEMOGLOBIN: 12.9 g/dL (ref 12.0–15.0)
LYMPHS ABS: 2.2 10*3/uL (ref 0.7–4.0)
LYMPHS PCT: 52 % — AB (ref 12–46)
MCH: 29 pg (ref 26.0–34.0)
MCHC: 33.2 g/dL (ref 30.0–36.0)
MCV: 87.2 fL (ref 78.0–100.0)
MONOS PCT: 7 % (ref 3–12)
Monocytes Absolute: 0.3 10*3/uL (ref 0.1–1.0)
NEUTROS ABS: 1.5 10*3/uL — AB (ref 1.7–7.7)
NEUTROS PCT: 37 % — AB (ref 43–77)
Platelets: 301 10*3/uL (ref 150–400)
RBC: 4.45 MIL/uL (ref 3.87–5.11)
RDW: 13.3 % (ref 11.5–15.5)
WBC: 4.1 10*3/uL (ref 4.0–10.5)

## 2014-07-31 LAB — COMPREHENSIVE METABOLIC PANEL
ALBUMIN: 4.1 g/dL (ref 3.5–5.2)
ALK PHOS: 71 U/L (ref 39–117)
ALT: 10 U/L (ref 0–35)
ANION GAP: 7 (ref 5–15)
AST: 18 U/L (ref 0–37)
BUN: 7 mg/dL (ref 6–23)
CHLORIDE: 105 mmol/L (ref 96–112)
CO2: 26 mmol/L (ref 19–32)
Calcium: 9.1 mg/dL (ref 8.4–10.5)
Creatinine, Ser: 0.73 mg/dL (ref 0.50–1.10)
GFR calc Af Amer: >90 mL/min (ref >90)
GFR calc non Af Amer: >90 mL/min (ref >90)
GLUCOSE: 108 mg/dL — AB (ref 70–99)
Potassium: 3.9 mmol/L (ref 3.5–5.1)
Sodium: 138 mmol/L (ref 135–145)
Total Bilirubin: 0.8 mg/dL (ref 0.3–1.2)
Total Protein: 7.8 g/dL (ref 6.0–8.3)

## 2014-07-31 LAB — RAPID URINE DRUG SCREEN, HOSP PERFORMED
Amphetamines: NOT DETECTED
BARBITURATES: NOT DETECTED
BENZODIAZEPINES: NOT DETECTED
Cocaine: NOT DETECTED
Opiates: NOT DETECTED
Tetrahydrocannabinol: NOT DETECTED

## 2014-07-31 LAB — WET PREP, GENITAL
CLUE CELLS WET PREP: NONE SEEN
Trich, Wet Prep: NONE SEEN
Yeast Wet Prep HPF POC: NONE SEEN

## 2014-07-31 LAB — ETHANOL: Alcohol, Ethyl (B): <5 mg/dL (ref 0–9)

## 2014-07-31 MED ORDER — IBUPROFEN 400 MG PO TABS: 600.0000 mg | ORAL_TABLET | Freq: Three times a day (TID) | ORAL | Status: DC | PRN

## 2014-07-31 MED ORDER — NICOTINE 21 MG/24HR TD PT24: 21.0000 mg | MEDICATED_PATCH | Freq: Every day | TRANSDERMAL | Status: DC

## 2014-07-31 MED ORDER — HYDRALAZINE HCL 20 MG/ML IJ SOLN: 10.0000 mg | INTRAMUSCULAR | Status: DC

## 2014-07-31 MED ORDER — LORAZEPAM 1 MG PO TABS: 1.0000 mg | ORAL_TABLET | Freq: Three times a day (TID) | ORAL | Status: DC | PRN

## 2014-07-31 MED ORDER — NICOTINE 21 MG/24HR TD PT24
21.0000 mg | MEDICATED_PATCH | Freq: Every day | TRANSDERMAL | Status: DC
  Filled 2014-07-31: qty 1

## 2014-07-31 MED ORDER — HYDRALAZINE HCL 20 MG/ML IJ SOLN: 10.0000 mg | Freq: Once | INTRAMUSCULAR | Status: DC

## 2014-07-31 MED ORDER — ALUM & MAG HYDROXIDE-SIMETH 200-200-20 MG/5ML PO SUSP: 30.0000 mL | ORAL | Status: DC | PRN

## 2014-07-31 MED ORDER — LORAZEPAM 2 MG/ML IJ SOLN: 1.0000 mg | Freq: Once | INTRAMUSCULAR | Status: DC

## 2014-07-31 MED ORDER — ZIPRASIDONE MESYLATE 20 MG IM SOLR
20.0000 mg | Freq: Once | INTRAMUSCULAR | Status: AC
  Administered 2014-07-31: 20 mg via INTRAMUSCULAR
  Filled 2014-07-31: qty 20

## 2014-07-31 MED ORDER — ONDANSETRON HCL 4 MG PO TABS: 4.0000 mg | ORAL_TABLET | Freq: Three times a day (TID) | ORAL | Status: DC | PRN

## 2014-07-31 MED ORDER — STERILE WATER FOR INJECTION IJ SOLN
INTRAMUSCULAR | Status: AC
  Administered 2014-07-31: 1.2 mL
  Filled 2014-07-31: qty 10

## 2014-07-31 MED ORDER — DIPHENHYDRAMINE HCL 50 MG/ML IJ SOLN: 25.0000 mg | Freq: Once | INTRAMUSCULAR | Status: DC

## 2014-07-31 MED ORDER — HYDRALAZINE HCL 25 MG PO TABS
25.0000 mg | ORAL_TABLET | Freq: Once | ORAL | Status: AC
  Administered 2014-07-31: 25 mg via ORAL
  Filled 2014-07-31 (×2): qty 1

## 2014-07-31 MED ORDER — ACETAMINOPHEN 325 MG PO TABS: 650.0000 mg | ORAL_TABLET | ORAL | Status: DC | PRN

## 2014-07-31 MED ORDER — RISPERIDONE 0.5 MG PO TABS
2.0000 mg | ORAL_TABLET | Freq: Every day | ORAL | Status: DC
  Filled 2014-07-31: qty 4

## 2014-07-31 MED ORDER — LISINOPRIL 10 MG PO TABS
10.0000 mg | ORAL_TABLET | Freq: Every day | ORAL | Status: DC
  Administered 2014-07-31: 10 mg via ORAL
  Filled 2014-07-31: qty 1

## 2014-07-31 NOTE — ED Notes (Signed)
Spoke with PA about patient's care. Stated once patient's IVC papers were complete, Patient would have to get Blood Work and do pelvic exam, but she wanted to wait until these were complete before continuing.

## 2014-07-31 NOTE — ED Notes (Signed)
MEAL ORDERED FOR PATIENT

## 2014-07-31 NOTE — ED Notes (Signed)
Patient walked to the bathroom. Patient given a cup to collect urine sample.

## 2014-07-31 NOTE — ED Notes (Signed)
Made patient aware that the Psychiatrist felt she needed to stay in the hospital to have inpatient treatment. Patient became angry and stated, "I am not going to stay here. Give me my papers so I can leave." Security and [employer] at the bedside. Patient threw remote on the ground and three trash around the room. [employer] restrained patient and patient encouraged to calm down. Patient yelling, "I am not staying at this f* hospital."

## 2014-07-31 NOTE — Progress Notes (Signed)
[facility].   Patient worried about her belongings, thus [facility] is in the [facility].  [facility] and also do a note with dates for patient being in hospital.  Patient made aware and agreeable.  Deretha EmoryHannah Kiarrah Rausch LCSW, MSW Clinical Social Work: Emergency Room [phone]

## 2014-07-31 NOTE — ED Notes (Signed)
Patient given [employer]. Waiting for IVC papers to be approved. Patient refuses to get blood drawn at this time. Patient is cooperative.
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
## 2014-07-31 NOTE — ED Notes (Signed)
Patient asleep with sitter at the bedside. 

## 2014-07-31 NOTE — ED Notes (Signed)
RN attempted to call [facility] to let them know [name] up and ready and pt may cooperate in the moment; no one can be reached.

## 2014-07-31 NOTE — Progress Notes (Addendum)
Patient referred to the following [facility]: [facility], [facility], [facility], [facility], [facility], [facility], [facility].  [facility] - Per Nicholos JohnsKathleen, referral under review. [facility] - "we have one adult female bed", per Olegario MessierKathy. Per Jonny RuizJohn, "Not sure, I've got several to look at". [facility] - Per Florentina AddisonKatie, have beds, fax referral. Per Dennie BiblePat, he will touch base with assessment team and get back with writer. [facility] - per Molly Maduroobert, fax psych and MD's assessments, and labs. Per [facility] "have pts in ED, didn't look at referrals yet. [facility] - voicemails [facility] - Per Tinnie GensJennie, "fax referral for admissions tomorrow". [facility], referral was received at 11pm. [facility] - per Morrie SheldonAshley, fax referral, "we do have a couple of adult female beds, no child/adolescent/geriatric beds but using the wait list". [facility] - [facility]email. [facility] - voicemail. [facility] - "you can fax the referral 'cause we have d/c tomorrow".  At capacity: [facility] - low acuity only. No high acuity beds.  Declined at: [facility] due to chronicity  [name] will continue to seek placement.  Holly Hines, LCSWA Disposition staff 07/31/2014 5:40 PM
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
## 2014-07-31 NOTE — ED Notes (Signed)
Patient calmed down and placed scrubs on. Patient was wanded and sitter continued to be at the bedside with [employer]. Patient was cooperative and walked to Pod C to bed placement. Drinda ButtsAnnette, RN made aware of patient's situation and verbalized understanding. IVC paperwork at the bedside.

## 2014-07-31 NOTE — ED Notes (Signed)
[facility] inquiring about pt

## 2014-07-31 NOTE — ED Notes (Signed)
Patient is awake and alert. Patient is speaking with [employer]. Sitting up in the bed.

## 2014-07-31 NOTE — ED Notes (Signed)
Phlebotomy at the bedside. Patient cooperative.

## 2014-07-31 NOTE — ED Notes (Signed)
[employer] at the patient's room with IVC papers. Stated papers are complete. PA made aware.

## 2014-07-31 NOTE — ED Notes (Addendum)
[name] set up at bedside. Pt refusing blood draw.

## 2014-07-31 NOTE — ED Notes (Signed)
I asked the patient if she wanted her nicotine patch and initially she said yes.  When I was going to put it on her she said, "just let me keep it and I will put it on myself."   I advised her that I could not give her the Nicotine ne patch that I would have to put it on her.  She then said she did not want it.  I also advised her that I was going to chart refused and she would not be able to have it until tomorrow.   She said that was fine.
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
## 2014-07-31 NOTE — ED Notes (Signed)
PA made aware of patient's Vital Signs. Stated she would speak with Behavioral Health and continue from there.

## 2014-07-31 NOTE — ED Notes (Signed)
PA at bedside.

## 2014-07-31 NOTE — ED Notes (Addendum)
Lab in to attempt to draw and pt takes the tourniquet off her arm and states,"they just did blood work and I don't have any STD's". PA made aware.

## 2014-08-01 LAB — GC/CHLAMYDIA PROBE AMP (~~LOC~~) NOT AT ARMC
CHLAMYDIA, DNA PROBE: NEGATIVE
Neisseria Gonorrhea: NEGATIVE

## 2014-08-01 NOTE — ED Notes (Signed)
sherriff called to transport patient. He advises should be before lunch

## 2014-08-01 NOTE — ED Notes (Signed)
Patient calm, sitter at the bedside. 

## 2014-08-01 NOTE — [facility] Counselor (Signed)
Pt accepted [facility] per [place]. Accepting physician Dr. Tonita PhoenixKomissarova. Pt will be transported to the [facility] unit. Number to call report- [phone]. Can be transported any time.   Kateri PlummerKristin Deb Loudin, M.S., LPCA, [place], [facility] Licensed Professional Counselor Associate  Triage Specialist  [facility]  Therapeutic Triage Services Phone: [phone] Fax: [phone]

## 2014-08-01 NOTE — ED Notes (Signed)
The patient is asleep with sitter at the bedside. 

## 2014-08-01 NOTE — ED Notes (Signed)
Dr. Oni at bedside. 

## 2014-08-01 NOTE — ED Notes (Signed)
Patient told sherriff she doesn't want to go to [facility], that she doesn't need her. sherriff placed pt in handcuffs for safety. Pt cooperative with cuff placement

## 2014-08-01 NOTE — ED Notes (Signed)
Pt ambulated to restroom. 

## 2014-08-01 NOTE — ED Notes (Signed)
All valuables and belongings sent to [facility] with patient

## 2014-08-01 NOTE — ED Notes (Signed)
The patient is watching tv with sitter at the bedside.  She did say she was not going to

## 2014-08-01 NOTE — ED Notes (Signed)
sherriff has arrived to transport [facility], papers, valuables and belongings released to sherriff spears

## 2014-08-01 NOTE — Progress Notes (Signed)
Spoke with [facility] RN regarding [facility]'s [facility] by Dr. Tonita PhoenixKomissarova. Report #[phone]  Ilean SkillMeghan Kaylaann Mountz, MSW, LCSWA Clinical Social Work, Disposition  08/01/2014 [phone]

## 2014-08-01 NOTE — ED Notes (Signed)
Pt stating that she wants to leave, Pt states that she does not want to be a patient and that she is not safe here. Pt appears to be agitated.

## 2014-11-15 ENCOUNTER — Emergency Department (HOSPITAL_COMMUNITY)
Admission: EM | Admit: 2014-11-15 | Discharge: 2014-11-15 | Discharge status: Against Medical Advice | Payer: Medicaid Other | Attending: Emergency Medicine | Admitting: Emergency Medicine

## 2014-11-15 ENCOUNTER — Encounter (HOSPITAL_COMMUNITY): Payer: Self-pay

## 2014-11-15 DIAGNOSIS — Z720 Tobacco use: Secondary | ICD-10-CM | POA: Diagnosis not present

## 2014-11-15 DIAGNOSIS — Z79899 Other long term (current) drug therapy: Secondary | ICD-10-CM | POA: Insufficient documentation

## 2014-11-15 DIAGNOSIS — I10 Essential (primary) hypertension: Secondary | ICD-10-CM | POA: Insufficient documentation

## 2014-11-15 DIAGNOSIS — R443 Hallucinations, unspecified: Secondary | ICD-10-CM

## 2014-11-15 DIAGNOSIS — F22 Delusional disorders: Secondary | ICD-10-CM | POA: Diagnosis present

## 2014-11-15 NOTE — ED Notes (Signed)
Patient refused to collect urine specimen. Pt. Stated, "I don't need my urine tested."

## 2014-11-15 NOTE — ED Notes (Signed)
Pt is refusing to change into paper scrubs, pt sts "she does not want to change like everybody else and she does not want to evaluated by the doctor and she did not request to come to [place] ED."  "Her only issue today is her back."
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
## 2014-11-15 NOTE — ED Notes (Signed)
Pt is refusing lab work.  RN aware.

## 2014-11-15 NOTE — ED Notes (Signed)
Patient attempted to walk back in tot he main ED patient care area with belongings. Patient instructed that she had a room(triage 4) and needed to stay in her room. Patient kept walking. Security called and patient then left the ED with her belongings.

## 2014-11-15 NOTE — ED Notes (Signed)
Patient attempted to walk back into the ED patient care area with her suitcase and other belongings. Patient instructed that she needed to stay in her room. Security called. Patient walked out of the ED with belongings. EDPA notified that the patient left AMA.
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
## 2014-11-21 ENCOUNTER — Encounter (HOSPITAL_COMMUNITY): Payer: Self-pay | Admitting: Emergency Medicine

## 2014-11-21 ENCOUNTER — Emergency Department (EMERGENCY_DEPARTMENT_HOSPITAL)
Admission: EM | Admit: 2014-11-21 | Discharge: 2014-11-27 | Disposition: A | Discharge status: Other Acute Inpatient Hospital | Payer: Medicaid Other | Source: Home / Self Care | Attending: Emergency Medicine | Admitting: Emergency Medicine

## 2014-11-21 ENCOUNTER — Emergency Department (HOSPITAL_COMMUNITY): Payer: Medicaid Other

## 2014-11-21 DIAGNOSIS — O9989 Other specified diseases and conditions complicating pregnancy, childbirth and the puerperium: Secondary | ICD-10-CM | POA: Insufficient documentation

## 2014-11-21 DIAGNOSIS — R109 Unspecified abdominal pain: Secondary | ICD-10-CM | POA: Insufficient documentation

## 2014-11-21 DIAGNOSIS — Z3490 Encounter for supervision of normal pregnancy, unspecified, unspecified trimester: Secondary | ICD-10-CM

## 2014-11-21 DIAGNOSIS — F29 Unspecified psychosis not due to a substance or known physiological condition: Secondary | ICD-10-CM | POA: Insufficient documentation

## 2014-11-21 DIAGNOSIS — F200 Paranoid schizophrenia: Secondary | ICD-10-CM | POA: Diagnosis present

## 2014-11-21 DIAGNOSIS — O99331 Smoking (tobacco) complicating pregnancy, first trimester: Secondary | ICD-10-CM | POA: Insufficient documentation

## 2014-11-21 DIAGNOSIS — O10011 Pre-existing essential hypertension complicating pregnancy, first trimester: Secondary | ICD-10-CM

## 2014-11-21 DIAGNOSIS — F17210 Nicotine dependence, cigarettes, uncomplicated: Secondary | ICD-10-CM

## 2014-11-21 DIAGNOSIS — Z79899 Other long term (current) drug therapy: Secondary | ICD-10-CM | POA: Insufficient documentation

## 2014-11-21 DIAGNOSIS — O99341 Other mental disorders complicating pregnancy, first trimester: Secondary | ICD-10-CM | POA: Insufficient documentation

## 2014-11-21 DIAGNOSIS — Z3A01 Less than 8 weeks gestation of pregnancy: Secondary | ICD-10-CM | POA: Insufficient documentation

## 2014-11-21 DIAGNOSIS — F23 Brief psychotic disorder: Secondary | ICD-10-CM

## 2014-11-21 LAB — RAPID URINE DRUG SCREEN, HOSP PERFORMED
Amphetamines: NOT DETECTED
BARBITURATES: NOT DETECTED
Benzodiazepines: NOT DETECTED
COCAINE: NOT DETECTED
OPIATES: NOT DETECTED
TETRAHYDROCANNABINOL: NOT DETECTED

## 2014-11-21 LAB — BASIC METABOLIC PANEL
ANION GAP: 8 (ref 5–15)
BUN: 9 mg/dL (ref 6–20)
CO2: 23 mmol/L (ref 22–32)
Calcium: 9.4 mg/dL (ref 8.9–10.3)
Chloride: 107 mmol/L (ref 101–111)
Creatinine, Ser: 0.5 mg/dL (ref 0.44–1.00)
GFR calc Af Amer: >60 mL/min (ref >60)
GFR calc non Af Amer: >60 mL/min (ref >60)
Glucose, Bld: 101 mg/dL — ABNORMAL HIGH (ref 65–99)
Potassium: 3.7 mmol/L (ref 3.5–5.1)
Sodium: 138 mmol/L (ref 135–145)

## 2014-11-21 LAB — CBC WITH DIFFERENTIAL/PLATELET
Basophils Absolute: 0 10*3/uL (ref 0.0–0.1)
Basophils Relative: 0 % (ref 0–1)
EOS PCT: 4 % (ref 0–5)
Eosinophils Absolute: 0.2 10*3/uL (ref 0.0–0.7)
HCT: 32.9 % — ABNORMAL LOW (ref 36.0–46.0)
HEMOGLOBIN: 11 g/dL — AB (ref 12.0–15.0)
Lymphocytes Relative: 31 % (ref 12–46)
Lymphs Abs: 1.8 10*3/uL (ref 0.7–4.0)
MCH: 30.1 pg (ref 26.0–34.0)
MCHC: 33.4 g/dL (ref 30.0–36.0)
MCV: 90.1 fL (ref 78.0–100.0)
MONO ABS: 0.4 10*3/uL (ref 0.1–1.0)
Monocytes Relative: 7 % (ref 3–12)
NEUTROS PCT: 58 % (ref 43–77)
Neutro Abs: 3.3 10*3/uL (ref 1.7–7.7)
Platelets: 350 10*3/uL (ref 150–400)
RBC: 3.65 MIL/uL — AB (ref 3.87–5.11)
RDW: 14.5 % (ref 11.5–15.5)
WBC: 5.7 10*3/uL (ref 4.0–10.5)

## 2014-11-21 LAB — SALICYLATE LEVEL: Salicylate Lvl: <4 mg/dL (ref 2.8–30.0)

## 2014-11-21 LAB — I-STAT BETA HCG BLOOD, ED (MC, WL, AP ONLY): I-stat hCG, quantitative: >2000 m[IU]/mL — ABNORMAL HIGH (ref <5)

## 2014-11-21 LAB — HCG, QUANTITATIVE, PREGNANCY: hCG, Beta Chain, Quant, S: 36914 m[IU]/mL — ABNORMAL HIGH (ref <5)

## 2014-11-21 LAB — ABO/RH: ABO/RH(D): O POS

## 2014-11-21 LAB — ACETAMINOPHEN LEVEL: Acetaminophen (Tylenol), Serum: <10 ug/mL — ABNORMAL LOW (ref 10–30)

## 2014-11-21 LAB — ETHANOL: Alcohol, Ethyl (B): <5 mg/dL (ref <5)

## 2014-11-21 MED ORDER — ACETAMINOPHEN 325 MG PO TABS: 650.0000 mg | ORAL_TABLET | ORAL | Status: DC | PRN

## 2014-11-21 NOTE — ED Notes (Signed)
Pt telling MD Docherty "No, I'm not pregnant, I don't want to be pregnant!".
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
## 2014-11-21 NOTE — ED Notes (Signed)
Pt says she is unable to give a urine sample at this time.
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
## 2014-11-21 NOTE — ED Notes (Signed)
Bed: [id number] Expected date:  Expected time:  Means of arrival:  Comments: Triage 3 

## 2014-11-22 DIAGNOSIS — F200 Paranoid schizophrenia: Secondary | ICD-10-CM

## 2014-11-22 MED ORDER — OLANZAPINE 2.5 MG PO TABS
2.5000 mg | ORAL_TABLET | Freq: Every day | ORAL | Status: DC
  Administered 2014-11-22: 2.5 mg via ORAL
  Filled 2014-11-22: qty 1

## 2014-11-22 NOTE — Progress Notes (Signed)
[name] was notified by [name] disposition counselor that the patient has been referred to the following facilities:  [facility] (no beds at this time) [facility]   [name] met with patient at bedside and informed her that she has been referred out to multiple facilities. The patient is sitting quietly with her head down. Patient did not have any questions for [name].  Willette Brace [phone] ED [name] 11/22/2014 4:25 PM
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
## 2014-11-22 NOTE — BH Assessment (Signed)
Spoke with Amy at [facility] who reported that pt was denied due to chronicity of illness. [name] will continue to seek placement.

## 2014-11-22 NOTE — BH Assessment (Signed)
Consulted Hulan Fess, NP and after a review of pt's record she agreed that pt meets inpatient criteria. [name] will contact other facilities for placement. Dr. Micheline Maze has been informed of the recommendation.

## 2014-11-22 NOTE — ED Notes (Signed)
Patient is currently sleeping, vitals will be assessed at a later time. 

## 2014-11-22 NOTE — ED Notes (Signed)
Pt keeps pulling the code blue button down and walking around talking to herself. When pt is asked why she keeps doing this she walks away from staff and then begins to say something that is not heard by the staff. Pt did eat her lunch and has drank some beverages.

## 2014-11-22 NOTE — BH Assessment (Signed)
[facility] Assessment Progress Note  The following facilities have been contacted to seek placement for this pt, with results as noted:  Beds available, information sent, decision pending:  [facility]   No beds available but accepting referral information for future consideration; information sent:  [facility]   At capacity:  [facility]   Doylene Canning, [place] Triage Specialist [phone]

## 2014-11-22 NOTE — ED Notes (Signed)
Patient seen sleeping. Did not respond to her name. Respiration is even and unlabored. Patient left to continue resting at this time. Will continue to monitor patient.

## 2014-11-22 NOTE — ED Notes (Signed)
Patient refused RN notified

## 2014-11-22 NOTE — Consult Note (Signed)
[place] [facility] Consult   Reason for Consult: labile mood, delusional Referring Physician:  EDP Patient Identification: Holly Hines MRN:  [id number] Principal Diagnosis: Paranoid schizophrenia Diagnosis:   Patient Active Problem List   Diagnosis Date Noted  . Paranoid schizophrenia [F20.0]     Priority: High  . Hypertension in pregnancy, antepartum [O16.9] 10/23/2012    Priority: High  . Schizophrenia, paranoid [F20.0] 10/10/2011    Priority: High    Total Time spent with patient: 45 minutes  Subjective:   Holly Hines is a 44 y.o. female patient admitted with bizarre behavior and delusional thinking.  HPI:  Patient with history of Paranoid Schizophrenia who brought [facility] by [facility] for dangerous bizarre behavior.Patient is a poor historian, most information were obtained from the chart. According to paperwork, patient was found lying in the road several times this week, she has not been eating, refusing to drink and non-compliant with medications. Patient has been paranoid, saying that people are trying to poisoning her.Patient also reports she has been feeling like  sharp pins or metal rod spoking from her back to her uterus for the past 6 years, since they "cut her open" at [facility]. Patient is labile, easily agitated and uncooperative. Patient will benefit from inpatient admission for stabilization. Patient is about 5-[redacted] weeks pregnant.  HPI Elements:   Location:  delusions, disorganized behavior. Quality:  severe.  Past Medical History:  Past Medical History  Diagnosis Date  . Mental disorder   . Pregnant   . Hypertension     Past Surgical History  Procedure Laterality Date  . Cesarean section  9 yrs ago    Family History: History reviewed. No pertinent family history. Social History:  History  Alcohol Use: Not on file     History  Drug Use Not on file    History   Social History  . Marital Status: Single     Spouse Name: N/A  . Number of Children: N/A  . Years of Education: N/A   Social History Main Topics  . Smoking status: Current Some Day Smoker  . Smokeless tobacco: Not on file  . Alcohol Use: Not on file  . Drug Use: Not on file  . Sexual Activity: Yes   Other Topics Concern  . None   Social History Narrative   Additional Social History:    Pain Medications: unable to assess Prescriptions: unable to assess Over the Counter: unable to assess Longest period of sobriety (when/how long): unable to assess                     Allergies:   Allergies  Allergen Reactions  . Pollen Extract Other (See Comments)    unknown    Labs:  Results for orders placed or performed during the hospital encounter of 11/21/14 (from the past 48 hour(s))  CBC WITH DIFFERENTIAL     Status: Abnormal   Collection Time: 11/21/14  7:41 PM  Result Value Ref Range   WBC 5.7 4.0 - 10.5 K/uL   RBC 3.65 (L) 3.87 - 5.11 MIL/uL   Hemoglobin 11.0 (L) 12.0 - 15.0 g/dL   HCT 32.9 (L) 36.0 - 46.0 %   MCV 90.1 78.0 - 100.0 fL   MCH 30.1 26.0 - 34.0 pg   MCHC 33.4 30.0 - 36.0 g/dL   RDW 14.5 11.5 - 15.5 %   Platelets 350 150 - 400 K/uL   Neutrophils Relative % 58 43 - 77 %  Neutro Abs 3.3 1.7 - 7.7 K/uL   Lymphocytes Relative 31 12 - 46 %   Lymphs Abs 1.8 0.7 - 4.0 K/uL   Monocytes Relative 7 3 - 12 %   Monocytes Absolute 0.4 0.1 - 1.0 K/uL   Eosinophils Relative 4 0 - 5 %   Eosinophils Absolute 0.2 0.0 - 0.7 K/uL   Basophils Relative 0 0 - 1 %   Basophils Absolute 0.0 0.0 - 0.1 K/uL  Ethanol     Status: None   Collection Time: 11/21/14  7:41 PM  Result Value Ref Range   Alcohol, Ethyl (B) <5 <5 mg/dL    Comment:        LOWEST DETECTABLE LIMIT FOR SERUM ALCOHOL IS 5 mg/dL FOR MEDICAL PURPOSES ONLY   Basic metabolic panel     Status: Abnormal   Collection Time: 11/21/14  7:41 PM  Result Value Ref Range   Sodium 138 135 - 145 mmol/L   Potassium 3.7 3.5 - 5.1 mmol/L   Chloride 107  101 - 111 mmol/L   CO2 23 22 - 32 mmol/L   Glucose, Bld 101 (H) 65 - 99 mg/dL   BUN 9 6 - 20 mg/dL   Creatinine, Ser 0.50 0.44 - 1.00 mg/dL   Calcium 9.4 8.9 - 10.3 mg/dL   GFR calc non Af Amer >60 >60 mL/min   GFR calc Af Amer >60 >60 mL/min    Comment: (NOTE) The eGFR has been calculated using the CKD EPI equation. This calculation has not been validated in all clinical situations. eGFR's persistently <60 mL/min signify possible Chronic Kidney Disease.    Anion gap 8 5 - 15  Acetaminophen level     Status: Abnormal   Collection Time: 11/21/14  7:41 PM  Result Value Ref Range   Acetaminophen (Tylenol), Serum <10 (L) 10 - 30 ug/mL    Comment:        THERAPEUTIC CONCENTRATIONS VARY SIGNIFICANTLY. A RANGE OF 10-30 ug/mL MAY BE AN EFFECTIVE CONCENTRATION FOR MANY PATIENTS. HOWEVER, SOME ARE BEST TREATED AT CONCENTRATIONS OUTSIDE THIS RANGE. ACETAMINOPHEN CONCENTRATIONS >150 ug/mL AT 4 HOURS AFTER INGESTION AND >50 ug/mL AT 12 HOURS AFTER INGESTION ARE OFTEN ASSOCIATED WITH TOXIC REACTIONS.   Salicylate level     Status: None   Collection Time: 11/21/14  7:41 PM  Result Value Ref Range   Salicylate Lvl <3.3 2.8 - 30.0 mg/dL  I-Stat Beta hCG blood, ED ([facility], [facility], [facility] only)     Status: Abnormal   Collection Time: 11/21/14  7:48 PM  Result Value Ref Range   I-stat hCG, quantitative >2000.0 (H) <5 mIU/mL   Comment 3            Comment:   GEST. AGE      CONC.  (mIU/mL)   <=1 WEEK        5 - 50     2 WEEKS       50 - 500     3 WEEKS       100 - 10,000     4 WEEKS     1,000 - 30,000        FEMALE AND NON-PREGNANT FEMALE:     LESS THAN 5 mIU/mL   Urine rapid drug screen (hosp performed)not at [facility]     Status: None   Collection Time: 11/21/14  8:15 PM  Result Value Ref Range   Opiates NONE DETECTED NONE DETECTED   Cocaine NONE DETECTED NONE DETECTED   Benzodiazepines  NONE DETECTED NONE DETECTED   Amphetamines NONE DETECTED NONE DETECTED   Tetrahydrocannabinol NONE DETECTED  NONE DETECTED   Barbiturates NONE DETECTED NONE DETECTED    Comment:        DRUG SCREEN FOR MEDICAL PURPOSES ONLY.  IF CONFIRMATION IS NEEDED FOR ANY PURPOSE, NOTIFY LAB WITHIN 5 DAYS.        LOWEST DETECTABLE LIMITS FOR URINE DRUG SCREEN Drug Class       Cutoff (ng/mL) Amphetamine      1000 Barbiturate      200 Benzodiazepine   376 Tricyclics       283 Opiates          300 Cocaine          300 THC              50   hCG, quantitative, pregnancy     Status: Abnormal   Collection Time: 11/21/14  9:23 PM  Result Value Ref Range   hCG, Beta Chain, Quant, S 36914 (H) <5 mIU/mL    Comment:          GEST. AGE      CONC.  (mIU/mL)   <=1 WEEK        5 - 50     2 WEEKS       50 - 500     3 WEEKS       100 - 10,000     4 WEEKS     1,000 - 30,000     5 WEEKS     3,500 - 115,000   6-8 WEEKS     12,000 - 270,000    12 WEEKS     15,000 - 220,000        FEMALE AND NON-PREGNANT FEMALE:     LESS THAN 5 mIU/mL   ABO/Rh     Status: None   Collection Time: 11/21/14  9:25 PM  Result Value Ref Range   ABO/RH(D) O POS     Vitals: Blood pressure 113/62, pulse 65, temperature 98.1 F (36.7 C), temperature source Oral, resp. rate 16, SpO2 100 %.  Risk to Self: Suicidal Ideation: No Suicidal Intent: No Is patient at risk for suicide?: No Suicidal Plan?: No Access to Means: No What has been your use of drugs/alcohol within the last 12 months?: Pt did not provide a response.  How many times?:  (Unable to assess ) Other Self Harm Risks:  (Unable to assess ) Triggers for Past Attempts:  (unable to assess) Intentional Self Injurious Behavior:  (Unable to assess ) Risk to Others: Homicidal Ideation: No Thoughts of Harm to Others: No Current Homicidal Intent: No Current Homicidal Plan: No Access to Homicidal Means: No Identified Victim: N/A History of harm to others?:  (Unable to assess ) Assessment of Violence: None Noted Violent Behavior Description: No violent behaviors observed at this  time.  Does patient have access to weapons?:  (Unable to assess ) Criminal Charges Pending?: No Does patient have a court date: No Prior Inpatient Therapy: Prior Inpatient Therapy: Yes Prior Therapy Dates: 2013,2014, 2016 Prior Therapy Facilty/Provider(s): [facility] Reason for Treatment: delusional Prior Outpatient Therapy: Prior Outpatient Therapy:  (unable to assess) Does patient have an ACCT team?: Unknown Does patient have Intensive In-House Services?  : No Does patient have [facility] services? : Unknown Does patient have P4CC services?: Unknown  Current Facility-Administered Medications  Medication Dose Route Frequency Provider Last Rate Last Dose  . acetaminophen (TYLENOL) tablet  650 mg  650 mg Oral Q4H PRN Ernestina Patches, MD      . OLANZapine (ZYPREXA) tablet 2.5 mg  2.5 mg Oral QHS Nichlas Pitera       Current Outpatient Prescriptions  Medication Sig Dispense Refill  . polyethylene glycol powder (GLYCOLAX/MIRALAX) powder Take 17 g by mouth 2 (two) times daily. Until daily soft stools  OTC (Patient not taking: Reported on 07/01/2014) 255 g 0    Musculoskeletal: Strength & Muscle Tone: within normal limits Gait & Station: normal Patient leans: N/A  Psychiatric Specialty Exam: Physical Exam  Psychiatric: Her affect is angry and labile. Her speech is tangential. She is agitated, aggressive and actively hallucinating. Thought content is paranoid and delusional. Cognition and memory are normal. She expresses impulsivity.    Review of Systems  Constitutional: Negative.   HENT: Negative.   Eyes: Negative.   Respiratory: Negative.   Cardiovascular: Negative.   Gastrointestinal: Negative.   Genitourinary: Negative.   Musculoskeletal: Negative.   Skin: Negative.   Neurological: Negative.   Endo/Heme/Allergies: Negative.   Psychiatric/Behavioral: Positive for hallucinations. The patient is nervous/anxious and has insomnia.     Blood pressure 113/62, pulse 65,  temperature 98.1 F (36.7 C), temperature source Oral, resp. rate 16, SpO2 100 %.There is no weight on file to calculate BMI.  General Appearance: Disheveled  Eye Contact::  Good  Speech:  Pressured  Volume:  Increased  Mood:  Angry and Irritable  Affect:  Labile  Thought Process:  Disorganized  Orientation:  Full (Time, Place, and Person)  Thought Content:  Delusions and Paranoid Ideation  Suicidal Thoughts:  No  Homicidal Thoughts:  No  Memory:  Immediate;   Fair Recent;   Fair Remote;   Fair  Judgement:  Impaired  Insight:  Lacking  Psychomotor Activity:  Increased  Concentration:  Fair  Recall:  [employer] of Knowledge:Fair  Language: Good  Akathisia:  No  Handed:  Right  AIMS (if indicated):     Assets:  Communication Skills  ADL's:  Intact  Cognition: WNL  Sleep:   poor   Medical Decision Making: Review or order clinical lab tests (1), Decision to obtain old records (1), Review of Medication Regimen & Side Effects (2) and Review of New Medication or Change in Dosage (2)  Treatment Plan Summary: Daily contact with patient to assess and evaluate symptoms and progress in treatment and Medication management  Plan:  Recommend psychiatric Inpatient admission when medically cleared. Disposition: as above  Corena Pilgrim, MD 11/22/2014 11:08 AM

## 2014-11-23 MED ORDER — DIPHENHYDRAMINE HCL 25 MG PO CAPS
50.0000 mg | ORAL_CAPSULE | Freq: Four times a day (QID) | ORAL | Status: DC | PRN
  Administered 2014-11-24 – 2014-11-27 (×2): 50 mg via ORAL
  Filled 2014-11-23 (×3): qty 2

## 2014-11-23 MED ORDER — OLANZAPINE 5 MG PO TABS: 5.0000 mg | ORAL_TABLET | Freq: Every day | ORAL | Status: DC

## 2014-11-23 MED ORDER — DIPHENHYDRAMINE HCL 50 MG/ML IJ SOLN
50.0000 mg | Freq: Four times a day (QID) | INTRAMUSCULAR | Status: DC | PRN
  Filled 2014-11-23: qty 1

## 2014-11-23 MED ORDER — LURASIDONE HCL 40 MG PO TABS
40.0000 mg | ORAL_TABLET | Freq: Every day | ORAL | Status: DC
  Administered 2014-11-25 – 2014-11-26 (×2): 40 mg via ORAL
  Filled 2014-11-23 (×5): qty 1

## 2014-11-23 NOTE — Consult Note (Signed)
[facility] Consult   Reason for Consult: labile mood, delusional, paranoid Referring Physician:  EDP Patient Identification: Holly Hines MRN:  [id number] Principal Diagnosis: Paranoid schizophrenia Diagnosis:   Patient Active Problem List   Diagnosis Date Noted  . Schizophrenia, paranoid [F20.0] 10/10/2011    Priority: High  . Paranoid schizophrenia [F20.0]   . Hypertension in pregnancy, antepartum [O16.9] 10/23/2012    Total Time spent with patient: 15 minutes  Subjective:   Holly Hines is a 44 y.o. female patient admitted with bizarre behavior and delusional thinking.  *On 11/23/14, pt seen and chart reviewed with this NP and Dr. Louretta Shorten. Pt continues to present as delusional and psychotic, stating that "call the police, people cut my vagina, people from [employer]". Pt does not endorse suicidal/homicidal ideation, but is clearly psychotic, paranoid,a nd delusion, and continues to warrant inpatient admission.  HPI:  Patient with history of Paranoid Schizophrenia who brought to [facility] IVC by [facility] for dangerous bizarre behavior.Patient is a poor historian, most information were obtained from the chart. According to paperwork, patient was found lying in the road several times this week, she has not been eating, refusing to drink and non-compliant with medications. Patient has been paranoid, saying that people are trying to poisoning her.Patient also reports she has been feeling like  sharp pins or metal rod spoking from her back to her uterus for the past 6 years, since they "cut her open" at [facility]. Patient is labile, easily agitated and uncooperative. Patient will benefit from inpatient admission for stabilization. Patient is about 5-[redacted] weeks pregnant.  HPI Elements:   Location:  delusions, disorganized behavior. Quality:  severe.  Past Medical History:  Past Medical History  Diagnosis Date  . Mental disorder   .  Pregnant   . Hypertension     Past Surgical History  Procedure Laterality Date  . Cesarean section  9 yrs ago    Family History: History reviewed. No pertinent family history. Social History:  History  Alcohol Use: Not on file     History  Drug Use Not on file    History   Social History  . Marital Status: Single    Spouse Name: N/A  . Number of Children: N/A  . Years of Education: N/A   Social History Main Topics  . Smoking status: Current Some Day Smoker  . Smokeless tobacco: Not on file  . Alcohol Use: Not on file  . Drug Use: Not on file  . Sexual Activity: Yes   Other Topics Concern  . None   Social History Narrative   Additional Social History:    Pain Medications: unable to assess Prescriptions: unable to assess Over the Counter: unable to assess Longest period of sobriety (when/how long): unable to assess                     Allergies:   Allergies  Allergen Reactions  . Pollen Extract Other (See Comments)    unknown    Labs:  Results for orders placed or performed during the hospital encounter of 11/21/14 (from the past 48 hour(s))  CBC WITH DIFFERENTIAL     Status: Abnormal   Collection Time: 11/21/14  7:41 PM  Result Value Ref Range   WBC 5.7 4.0 - 10.5 K/uL   RBC 3.65 (L) 3.87 - 5.11 MIL/uL   Hemoglobin 11.0 (L) 12.0 - 15.0 g/dL   HCT 32.9 (L) 36.0 - 46.0 %   MCV 90.1 78.0 -  100.0 fL   MCH 30.1 26.0 - 34.0 pg   MCHC 33.4 30.0 - 36.0 g/dL   RDW 14.5 11.5 - 15.5 %   Platelets 350 150 - 400 K/uL   Neutrophils Relative % 58 43 - 77 %   Neutro Abs 3.3 1.7 - 7.7 K/uL   Lymphocytes Relative 31 12 - 46 %   Lymphs Abs 1.8 0.7 - 4.0 K/uL   Monocytes Relative 7 3 - 12 %   Monocytes Absolute 0.4 0.1 - 1.0 K/uL   Eosinophils Relative 4 0 - 5 %   Eosinophils Absolute 0.2 0.0 - 0.7 K/uL   Basophils Relative 0 0 - 1 %   Basophils Absolute 0.0 0.0 - 0.1 K/uL  Ethanol     Status: None   Collection Time: 11/21/14  7:41 PM  Result Value Ref  Range   Alcohol, Ethyl (B) <5 <5 mg/dL    Comment:        LOWEST DETECTABLE LIMIT FOR SERUM ALCOHOL IS 5 mg/dL FOR MEDICAL PURPOSES ONLY   Basic metabolic panel     Status: Abnormal   Collection Time: 11/21/14  7:41 PM  Result Value Ref Range   Sodium 138 135 - 145 mmol/L   Potassium 3.7 3.5 - 5.1 mmol/L   Chloride 107 101 - 111 mmol/L   CO2 23 22 - 32 mmol/L   Glucose, Bld 101 (H) 65 - 99 mg/dL   BUN 9 6 - 20 mg/dL   Creatinine, Ser 0.50 0.44 - 1.00 mg/dL   Calcium 9.4 8.9 - 10.3 mg/dL   GFR calc non Af Amer >60 >60 mL/min   GFR calc Af Amer >60 >60 mL/min    Comment: (NOTE) The eGFR has been calculated using the CKD EPI equation. This calculation has not been validated in all clinical situations. eGFR's persistently <60 mL/min signify possible Chronic Kidney Disease.    Anion gap 8 5 - 15  Acetaminophen level     Status: Abnormal   Collection Time: 11/21/14  7:41 PM  Result Value Ref Range   Acetaminophen (Tylenol), Serum <10 (L) 10 - 30 ug/mL    Comment:        THERAPEUTIC CONCENTRATIONS VARY SIGNIFICANTLY. A RANGE OF 10-30 ug/mL MAY BE AN EFFECTIVE CONCENTRATION FOR MANY PATIENTS. HOWEVER, SOME ARE BEST TREATED AT CONCENTRATIONS OUTSIDE THIS RANGE. ACETAMINOPHEN CONCENTRATIONS >150 ug/mL AT 4 HOURS AFTER INGESTION AND >50 ug/mL AT 12 HOURS AFTER INGESTION ARE OFTEN ASSOCIATED WITH TOXIC REACTIONS.   Salicylate level     Status: None   Collection Time: 11/21/14  7:41 PM  Result Value Ref Range   Salicylate Lvl <6.5 2.8 - 30.0 mg/dL  I-Stat Beta hCG blood, ED ([facility], [facility], [facility] only)     Status: Abnormal   Collection Time: 11/21/14  7:48 PM  Result Value Ref Range   I-stat hCG, quantitative >2000.0 (H) <5 mIU/mL   Comment 3            Comment:   GEST. AGE      CONC.  (mIU/mL)   <=1 WEEK        5 - 50     2 WEEKS       50 - 500     3 WEEKS       100 - 10,000     4 WEEKS     1,000 - 30,000        FEMALE AND NON-PREGNANT FEMALE:     LESS THAN 5 mIU/mL  Urine  rapid drug screen (hosp performed)not at [facility]     Status: None   Collection Time: 11/21/14  8:15 PM  Result Value Ref Range   Opiates NONE DETECTED NONE DETECTED   Cocaine NONE DETECTED NONE DETECTED   Benzodiazepines NONE DETECTED NONE DETECTED   Amphetamines NONE DETECTED NONE DETECTED   Tetrahydrocannabinol NONE DETECTED NONE DETECTED   Barbiturates NONE DETECTED NONE DETECTED    Comment:        DRUG SCREEN FOR MEDICAL PURPOSES ONLY.  IF CONFIRMATION IS NEEDED FOR ANY PURPOSE, NOTIFY LAB WITHIN 5 DAYS.        LOWEST DETECTABLE LIMITS FOR URINE DRUG SCREEN Drug Class       Cutoff (ng/mL) Amphetamine      1000 Barbiturate      200 Benzodiazepine   782 Tricyclics       956 Opiates          300 Cocaine          300 THC              50   hCG, quantitative, pregnancy     Status: Abnormal   Collection Time: 11/21/14  9:23 PM  Result Value Ref Range   hCG, Beta Chain, Quant, S 36914 (H) <5 mIU/mL    Comment:          GEST. AGE      CONC.  (mIU/mL)   <=1 WEEK        5 - 50     2 WEEKS       50 - 500     3 WEEKS       100 - 10,000     4 WEEKS     1,000 - 30,000     5 WEEKS     3,500 - 115,000   6-8 WEEKS     12,000 - 270,000    12 WEEKS     15,000 - 220,000        FEMALE AND NON-PREGNANT FEMALE:     LESS THAN 5 mIU/mL   ABO/Rh     Status: None   Collection Time: 11/21/14  9:25 PM  Result Value Ref Range   ABO/RH(D) O POS     Vitals: Blood pressure 127/73, pulse 56, temperature 98.3 F (36.8 C), temperature source Oral, resp. rate 18, SpO2 100 %.  Risk to Self: Suicidal Ideation: No Suicidal Intent: No Is patient at risk for suicide?: No Suicidal Plan?: No Access to Means: No What has been your use of drugs/alcohol within the last 12 months?: Pt did not provide a response.  How many times?:  (Unable to assess ) Other Self Harm Risks:  (Unable to assess ) Triggers for Past Attempts:  (unable to assess) Intentional Self Injurious Behavior:  (Unable to assess ) Risk  to Others: Homicidal Ideation: No Thoughts of Harm to Others: No Current Homicidal Intent: No Current Homicidal Plan: No Access to Homicidal Means: No Identified Victim: N/A History of harm to others?:  (Unable to assess ) Assessment of Violence: None Noted Violent Behavior Description: No violent behaviors observed at this time.  Does patient have access to weapons?:  (Unable to assess ) Criminal Charges Pending?: No Does patient have a court date: No Prior Inpatient Therapy: Prior Inpatient Therapy: Yes Prior Therapy Dates: 2013,2014, 2016 Prior Therapy Facilty/Provider(s): [facility] Reason for Treatment: delusional Prior Outpatient Therapy: Prior Outpatient Therapy:  (unable to assess) Does patient have an  ACCT team?: Unknown Does patient have Intensive In-House Services?  : No Does patient have [facility] services? : Unknown Does patient have P4CC services?: Unknown  Current Facility-Administered Medications  Medication Dose Route Frequency Provider Last Rate Last Dose  . acetaminophen (TYLENOL) tablet 650 mg  650 mg Oral Q4H PRN Ernestina Patches, MD      . OLANZapine [facility]) tablet 2.5 mg  2.5 mg Oral QHS Mojeed Akintayo   2.5 mg at 11/22/14 2122   Current Outpatient Prescriptions  Medication Sig Dispense Refill  . polyethylene glycol powder (GLYCOLAX/MIRALAX) powder Take 17 g by mouth 2 (two) times daily. Until daily soft stools  OTC (Patient not taking: Reported on 07/01/2014) 255 g 0    Musculoskeletal: Strength & Muscle Tone: within normal limits Gait & Station: normal Patient leans: N/A  Psychiatric Specialty Exam: Physical Exam  Psychiatric: Her affect is angry and labile. Her speech is tangential. She is agitated, aggressive and actively hallucinating. Thought content is paranoid and delusional. Cognition and memory are normal. She expresses impulsivity.    Review of Systems  Constitutional: Negative.   HENT: Negative.   Eyes: Negative.    Respiratory: Negative.   Cardiovascular: Negative.   Gastrointestinal: Negative.   Genitourinary: Negative.   Musculoskeletal: Negative.   Skin: Negative.   Neurological: Negative.   Endo/Heme/Allergies: Negative.   Psychiatric/Behavioral: Positive for hallucinations. The patient is nervous/anxious and has insomnia.   All other systems reviewed and are negative.   Blood pressure 127/73, pulse 56, temperature 98.3 F (36.8 C), temperature source Oral, resp. rate 18, SpO2 100 %.There is no weight on file to calculate BMI.  General Appearance: Disheveled  Eye Contact::  Good  Speech:  Pressured  Volume:  Increased  Mood:  Angry and Irritable  Affect:  Labile  Thought Process:  Disorganized  Orientation:  Full (Time, Place, and Person)  Thought Content:  Delusions and Paranoid Ideation  Suicidal Thoughts:  No  Homicidal Thoughts:  No  Memory:  Immediate;   Fair Recent;   Fair Remote;   Fair  Judgement:  Impaired  Insight:  Lacking  Psychomotor Activity:  Increased  Concentration:  Fair  Recall:  [employer] of Knowledge:Fair  Language: Good  Akathisia:  No  Handed:  Right  AIMS (if indicated):     Assets:  Communication Skills  ADL's:  Intact  Cognition: WNL  Sleep:   poor   Medical Decision Making: Review or order clinical lab tests (1), Decision to obtain old records (1), Review of Medication Regimen & Side Effects (2) and Review of New Medication or Change in Dosage (2)  Treatment Plan Summary: Paranoid schizophrenia, unstable, warrants inpatient admission  Daily contact with patient to assess and evaluate symptoms and progress in treatment and Medication management   Medications: -Continue Benadryl 50mg  PO or IM q6h PRN agitation -Continue Latuda 40mg  PO daily for psychosis -Consider Zyprexa if the above are not effective Disposition:  -Continue seeking placement for safety and medication stabilization  Benjamine Mola, FNP-BC 11/23/2014 11:50 AM  Patient  seen face-to-face for psychiatric consultation and evaluation, case discussed with the treatment team and physician extender. Patient has been psychiatric with the disorganized, paranoid delusions with her moderate agitation and irritability. Patient is poorly compliant with current medication treatment and therapies. Formulated treatment plan as above and reviewed the information documented and agree with the treatment plan.  Adelee Hannula,JANARDHAHA R. 11/25/2014 3:13 PM

## 2014-11-23 NOTE — ED Notes (Signed)
Patient met sleeping. Awake at this time. Patient presents with elective mutism. Will look at you and turn away with an angry look on her face. Refuses her medications. Writer introduces self and encouraged patient to be compliant with her medications and treatment regimen. Patient also encouraged to verbalize needs to staff. Every 15 minutes check for safety maintained. Will continue to monitor patient for stability.

## 2014-11-23 NOTE — ED Notes (Signed)
Pt resting in bed, easily awaken. Refusing to speak to Clinical research associate. Emotional support and encouragement given. Will continue to monitor closely and evaluate for stabilization.

## 2014-11-23 NOTE — ED Notes (Signed)
Staff requested pt return to room during fire alarm. Pt pushed over side table with dinner tray to floor. Conrad NP notified of pt behavior.

## 2014-11-23 NOTE — ED Notes (Signed)
Patient for no apparent reason scream and stated " Leave me alone". Threw the plastic bowl, towels and toiletries to the hallway and went back to her room. Lying down quietly at this time. Will continue to monitor patient for safety and stability.

## 2014-11-24 NOTE — ED Notes (Signed)
Pt banged on doors leading to outer hallway then returned to room and forcefully shut door as Clinical research associate spoke to her.

## 2014-11-24 NOTE — ED Notes (Addendum)
Patient seen pacing the hallway and occasionally, throwing things from her room to the hallway. Offered and accepted benadryl 50 mg PO PRN for anxiety/agitation. Patient continues to pace the hallway but less agitated at this time. Will continue to monitor patient for safety and stability.

## 2014-11-24 NOTE — ED Notes (Signed)
Pt refused vitals 

## 2014-11-24 NOTE — ED Notes (Signed)
Pt banging on doors leading to outer hallway, verbally abusive to staff "you [id number]". Renata Caprice NP made aware. Pt returned to room and laid in bed. Medication offered, pt refused. Will continue to monitor closely.

## 2014-11-24 NOTE — Consult Note (Signed)
[facility] Face-to-Face Psychiatry Consult   Reason for Consult: labile mood, delusional, paranoid Referring Physician:  EDP Patient Identification: Holly Hines MRN:  [id number] Principal Diagnosis: Paranoid schizophrenia Diagnosis:   Patient Active Problem List   Diagnosis Date Noted  . Schizophrenia, paranoid [F20.0] 10/10/2011    Priority: High  . Paranoid schizophrenia [F20.0]   . Hypertension in pregnancy, antepartum [O16.9] 10/23/2012    Total Time spent with patient: 15 minutes  Subjective:   Holly Hines is a 44 y.o. female patient admitted with bizarre behavior and delusional thinking. Pt seen and chart reviewed with NP and MD. Pt continues to present as agitated, bizarre, and hostile, shouting at [place] during the assessment and refusing to answer questions. Pt continues to warrant inpatient admission.   HPI:  Patient with history of Paranoid Schizophrenia who brought to [facility] ED IVC by [facility] for dangerous bizarre behavior.Patient is a poor historian, most information were obtained from the chart. According to paperwork, patient was found lying in the road several times this week, she has not been eating, refusing to drink and non-compliant with medications. Patient has been paranoid, saying that people are trying to poisoning her.Patient also reports she has been feeling like  sharp pins or metal rod spoking from her back to her uterus for the past 6 years, since they "cut her open" at [facility]. Patient is labile, easily agitated and uncooperative. Patient will benefit from inpatient admission for stabilization. Patient is about 6-[redacted] weeks pregnant.  *On 11/23/14, pt seen and chart reviewed with this NP and Dr. Elsie Saas. Patient continues to present as delusional and psychotic, stating that "call the police, people cut my vagina, people from [employer]". Patient does not endorse suicidal/homicidal ideation, but is clearly psychotic, paranoid, and  delusion, and continues to warrant inpatient admission.  HPI Elements:   Location:  delusions, disorganized behavior. Quality:  severe.  Past Medical History:  Past Medical History  Diagnosis Date  . Mental disorder   . Pregnant   . Hypertension     Past Surgical History  Procedure Laterality Date  . Cesarean section  9 yrs ago    Family History: History reviewed. No pertinent family history. Social History:  History  Alcohol Use: Not on file     History  Drug Use Not on file    History   Social History  . Marital Status: Single    Spouse Name: N/A  . Number of Children: N/A  . Years of Education: N/A   Social History Main Topics  . Smoking status: Current Some Day Smoker  . Smokeless tobacco: Not on file  . Alcohol Use: Not on file  . Drug Use: Not on file  . Sexual Activity: Yes   Other Topics Concern  . None   Social History Narrative   Additional Social History:    Pain Medications: unable to assess Prescriptions: unable to assess Over the Counter: unable to assess Longest period of sobriety (when/how long): unable to assess                     Allergies:   Allergies  Allergen Reactions  . Pollen Extract Other (See Comments)    unknown    Labs:  No results found for this or any previous visit (from the past 48 hour(s)).  Vitals: Blood pressure 127/73, pulse 56, temperature 98.3 F (36.8 C), temperature source Oral, resp. rate 18, SpO2 100 %.  Risk to Self: Suicidal Ideation: No Suicidal Intent:  No Is patient at risk for suicide?: No Suicidal Plan?: No Access to Means: No What has been your use of drugs/alcohol within the last 12 months?: Pt did not provide a response.  How many times?:  (Unable to assess ) Other Self Harm Risks:  (Unable to assess ) Triggers for Past Attempts:  (unable to assess) Intentional Self Injurious Behavior:  (Unable to assess ) Risk to Others: Homicidal Ideation: No Thoughts of Harm to Others:  No Current Homicidal Intent: No Current Homicidal Plan: No Access to Homicidal Means: No Identified Victim: N/A History of harm to others?:  (Unable to assess ) Assessment of Violence: None Noted Violent Behavior Description: No violent behaviors observed at this time.  Does patient have access to weapons?:  (Unable to assess ) Criminal Charges Pending?: No Does patient have a court date: No Prior Inpatient Therapy: Prior Inpatient Therapy: Yes Prior Therapy Dates: 2013,2014, 2016 Prior Therapy Facilty/Provider(s): [facility] Reason for Treatment: delusional Prior Outpatient Therapy: Prior Outpatient Therapy:  (unable to assess) Does patient have an ACCT team?: Unknown Does patient have Intensive In-House Services?  : No Does patient have [facility] services? : Unknown Does patient have P4CC services?: Unknown  Current Facility-Administered Medications  Medication Dose Route Frequency Provider Last Rate Last Dose  . acetaminophen (TYLENOL) tablet 650 mg  650 mg Oral Q4H PRN Toy Cookey, MD      . diphenhydrAMINE (BENADRYL) capsule 50 mg  50 mg Oral Q6H PRN Beau Fanny, FNP       Or  . diphenhydrAMINE (BENADRYL) injection 50 mg  50 mg Intramuscular Q6H PRN Beau Fanny, FNP      . lurasidone (LATUDA) tablet 40 mg  40 mg Oral Daily Beau Fanny, FNP   40 mg at 11/23/14 1953   Current Outpatient Prescriptions  Medication Sig Dispense Refill  . polyethylene glycol powder (GLYCOLAX/MIRALAX) powder Take 17 g by mouth 2 (two) times daily. Until daily soft stools  OTC (Patient not taking: Reported on 07/01/2014) 255 g 0    Musculoskeletal: Strength & Muscle Tone: within normal limits Gait & Station: normal Patient leans: N/A  Psychiatric Specialty Exam: Physical Exam  Psychiatric: Her affect is angry and labile. Her speech is tangential. She is agitated, aggressive and actively hallucinating. Thought content is paranoid and delusional. Cognition and memory are  normal. She expresses impulsivity.    Review of Systems  Constitutional: Negative.   HENT: Negative.   Eyes: Negative.   Respiratory: Negative.   Cardiovascular: Negative.   Gastrointestinal: Negative.   Genitourinary: Negative.   Musculoskeletal: Negative.   Skin: Negative.   Neurological: Negative.   Endo/Heme/Allergies: Negative.   Psychiatric/Behavioral: Positive for hallucinations. The patient is nervous/anxious and has insomnia.   All other systems reviewed and are negative.   Blood pressure 127/73, pulse 56, temperature 98.3 F (36.8 C), temperature source Oral, resp. rate 18, SpO2 100 %.There is no weight on file to calculate BMI.  General Appearance: Disheveled  Eye Contact::  Good  Speech:  Pressured  Volume:  Increased  Mood:  Angry and Irritable  Affect:  Labile  Thought Process:  Disorganized  Orientation:  Full (Time, Place, and Person)  Thought Content:  Delusions and Paranoid Ideation  Suicidal Thoughts:  No  Homicidal Thoughts:  No  Memory:  Immediate;   Fair Recent;   Fair Remote;   Fair  Judgement:  Impaired  Insight:  Lacking  Psychomotor Activity:  Increased  Concentration:  Fair  Recall:  [employer] of Knowledge:Fair  Language: Good  Akathisia:  No  Handed:  Right  AIMS (if indicated):     Assets:  Communication Skills  ADL's:  Intact  Cognition: WNL  Sleep:   poor   Medical Decision Making: Review or order clinical lab tests (1), Decision to obtain old records (1), Review of Medication Regimen & Side Effects (2) and Review of New Medication or Change in Dosage (2)  Treatment Plan Summary: Paranoid schizophrenia, unstable, warrants inpatient admission  Daily contact with patient to assess and evaluate symptoms and progress in treatment and Medication management   Medications: -Continue Benadryl  PO or IM q6h PRN agitation -Continue Latuda  PO daily for psychosis -Consider Zyprexa if the above are not effective  Disposition:   -Continue seeking placement for safety and medication stabilization  Beau Fanny, FNP-BC 11/24/2014 12:53 PM  Patient seen face-to-face for psychiatric consultation and evaluation, case discussed with the treatment team and physician extender. Patient continued to be psychotic, paranoid, delusional and noncompliant with medication management at this time. Patient has no clinical improvement since yesterday. Patient meets criteria for acute psychiatric hospitalization for crisis stabilization, safety monitoring on medication management. Formulated treatment plan as above and reviewed the information documented and agree with the treatment plan.  Chanler Mendonca,JANARDHAHA R. 11/25/2014 3:07 PM

## 2014-11-24 NOTE — ED Notes (Signed)
Pt ate part of her meal and threw the remainder meal tray down the hallway.

## 2014-11-24 NOTE — ED Notes (Signed)
Pt threw (sandwich) meal in hallway.

## 2014-11-24 NOTE — ED Notes (Signed)
Pt alert and continues to be uncooperative. Affect/ mood irritable, agitated and labile.  Refused vital signs and medication. Verbal outburst after evaluated by MD this am.

## 2014-11-24 NOTE — ED Notes (Signed)
Pt requested a new sandwich and ate  "the other smelled like s---".  Pt much calmer and politely requesting food/ drink items.

## 2014-11-25 DIAGNOSIS — F23 Brief psychotic disorder: Secondary | ICD-10-CM | POA: Insufficient documentation

## 2014-11-25 NOTE — Consult Note (Signed)
[facility] Face-to-Face Psychiatry Consult   Reason for Consult:  Paranoia Referring Physician:  EDP Patient Identification: Holly Hines MRN:  [id number] Principal Diagnosis: Paranoid schizophrenia Diagnosis:   Patient Active Problem List   Diagnosis Date Noted  . Paranoid schizophrenia [F20.0]     Priority: High  . Hypertension in pregnancy, antepartum [O16.9] 10/23/2012  . Schizophrenia, paranoid [F20.0] 10/10/2011    Total Time spent with patient: 30 minutes  Subjective:   Holly Hines is a 44 y.o. female patient admitted with paranoia, delusions.  HPI:  The patient pulled her chair into the hall when the rounding team approached.  She sat down and stated she did not want to stay,does not want to be here.  "I don't want your trash."  [name] reports being in the ED because the people at the [facility] were jealous that her brother brought her fried chicken and they wanted it.  Bizarre behavior continues, paranoid behaviors of staff. HPI Elements:   Location:  generalized. Quality:  acute. Severity:  severe. Timing:  constnat. Duration:  few days. Context:  stressors, no medications.  Past Medical History:  Past Medical History  Diagnosis Date  . Mental disorder   . Pregnant   . Hypertension     Past Surgical History  Procedure Laterality Date  . Cesarean section  9 yrs ago    Family History: History reviewed. No pertinent family history. Social History:  History  Alcohol Use: Not on file     History  Drug Use Not on file    History   Social History  . Marital Status: Single    Spouse Name: N/A  . Number of Children: N/A  . Years of Education: N/A   Social History Main Topics  . Smoking status: Current Some Day Smoker  . Smokeless tobacco: Not on file  . Alcohol Use: Not on file  . Drug Use: Not on file  . Sexual Activity: Yes   Other Topics Concern  . None   Social History Narrative   Additional Social History:    Pain  Medications: unable to assess Prescriptions: unable to assess Over the Counter: unable to assess Longest period of sobriety (when/how long): unable to assess                     Allergies:   Allergies  Allergen Reactions  . Pollen Extract Other (See Comments)    unknown    Labs: No results found for this or any previous visit (from the past 48 hour(s)).  Vitals: Blood pressure 127/73, pulse 56, temperature 98.3 F (36.8 C), temperature source Oral, resp. rate 18, SpO2 100 %.  Risk to Self: Suicidal Ideation: No Suicidal Intent: No Is patient at risk for suicide?: No Suicidal Plan?: No Access to Means: No What has been your use of drugs/alcohol within the last 12 months?: Pt did not provide a response.  How many times?:  (Unable to assess ) Other Self Harm Risks:  (Unable to assess ) Triggers for Past Attempts:  (unable to assess) Intentional Self Injurious Behavior:  (Unable to assess ) Risk to Others: Homicidal Ideation: No Thoughts of Harm to Others: No Current Homicidal Intent: No Current Homicidal Plan: No Access to Homicidal Means: No Identified Victim: N/A History of harm to others?:  (Unable to assess ) Assessment of Violence: None Noted Violent Behavior Description: No violent behaviors observed at this time.  Does patient have access to weapons?:  (Unable to assess ) Criminal Charges  Pending?: No Does patient have a court date: No Prior Inpatient Therapy: Prior Inpatient Therapy: Yes Prior Therapy Dates: 2013,2014, 2016 Prior Therapy Facilty/Provider(s): [facility] Reason for Treatment: delusional Prior Outpatient Therapy: Prior Outpatient Therapy:  (unable to assess) Does patient have an ACCT team?: Unknown Does patient have Intensive In-House Services?  : No Does patient have [facility] services? : Unknown Does patient have P4CC services?: Unknown  Current Facility-Administered Medications  Medication Dose Route Frequency Provider Last  Rate Last Dose  . acetaminophen (TYLENOL) tablet 650 mg  650 mg Oral Q4H PRN Toy Cookey, MD      . diphenhydrAMINE (BENADRYL) capsule 50 mg  50 mg Oral Q6H PRN Beau Fanny, FNP   50 mg at 11/24/14 1919   Or  . diphenhydrAMINE (BENADRYL) injection 50 mg  50 mg Intramuscular Q6H PRN Beau Fanny, FNP      . lurasidone (LATUDA) tablet 40 mg  40 mg Oral Daily Beau Fanny, FNP   40 mg at 11/25/14 1042   Current Outpatient Prescriptions  Medication Sig Dispense Refill  . polyethylene glycol powder (GLYCOLAX/MIRALAX) powder Take 17 g by mouth 2 (two) times daily. Until daily soft stools  OTC (Patient not taking: Reported on 07/01/2014) 255 g 0    Musculoskeletal: Strength & Muscle Tone: within normal limits Gait & Station: normal Patient leans: N/A  Psychiatric Specialty Exam: Physical Exam  Review of Systems  Constitutional: Negative.   HENT: Negative.   Eyes: Negative.   Respiratory: Negative.   Cardiovascular: Negative.   Gastrointestinal: Negative.   Genitourinary: Negative.   Musculoskeletal: Negative.   Skin: Negative.   Neurological: Negative.   Endo/Heme/Allergies: Negative.   Psychiatric/Behavioral:       Paranoia    Blood pressure 127/73, pulse 56, temperature 98.3 F (36.8 C), temperature source Oral, resp. rate 18, SpO2 100 %.There is no weight on file to calculate BMI.  General Appearance: Disheveled  Eye Contact::  Minimal  Speech:  Normal Rate  Volume:  Normal  Mood:  Angry and Irritable  Affect:  Congruent  Thought Process:  Irrelevant  Orientation:  Full (Time, Place, and Person)  Thought Content:  Delusions and Paranoid Ideation  Suicidal Thoughts:  No  Homicidal Thoughts:  No  Memory:  Immediate;   Fair Recent;   Fair Remote;   Fair  Judgement:  Impaired  Insight:  Lacking  Psychomotor Activity:  Decreased  Concentration:  Fair  Recall:  [employer] of Knowledge:Fair  Language: Fair  Akathisia:  No  Handed:  Right  AIMS (if indicated):      Assets:  Leisure Time Physical Health Resilience  ADL's:  Intact  Cognition: WNL  Sleep:      Medical Decision Making: Review of Psycho-Social Stressors (1) and Review of New Medication or Change in Dosage (2)  Treatment Plan Summary: Daily contact with patient to assess and evaluate symptoms and progress in treatment, Medication management and Plan :'  Paranoid schizophrenia:  Latuda 40 mg daily continues for mood stability [facility] prenatal unit notified, seeking admission  Plan:  Recommend psychiatric Inpatient admission when medically cleared. Disposition: Eloise Levels, PMH-NP 11/25/2014 12:50 PM Patient seen face-to-face for psychiatric evaluation, chart reviewed and case discussed with the physician extender and developed treatment plan. Reviewed the information documented and agree with the treatment plan. Thedore Mins, MD

## 2014-11-25 NOTE — ED Notes (Signed)
Patient refused vitals Holly Hines made aware

## 2014-11-25 NOTE — ED Notes (Signed)
Patient up and in the milieu most of the day.  She was irritable and not making much sense this morning, but has cleared and become more agreeable as the day has progressed.  She took her medications and her appetite is good.  She has not spit today and her behavior has been appropriate.  She was told by the MD this morning that she is pregnant and she has accepted this, but is not really talking about it yet.

## 2014-11-26 NOTE — BH Assessment (Addendum)
Reassessment 11/26/2014:    Previous notes from ED staff indicate that Patient was placed under IVC by [facility] for dangerous bizarre behavior. According to paperwork. Patient was found lying in the road several times this week. She is also paranoid that people are poisoning her. Patient also reports she has been feeling like she has sharp pins or metal rod spoking from her back to her uterus present every day for 6 years, since they "cut her open" at [facility], by which I think she is referring to his cesarean section.  Writer  met with patient to complete a reassessment. Patient refused to answer any questions. Patient told this Probation officer, "I'm tired and trying to sleep". Patient pulled covers over her head. Writer encouraged patient to answer questions. Patient again refused. Writer unable to determine if patient is suicidal, homicidal, and/or experiencing AVH's. Patient appears to be sleeping ok. Appetite unk.   The above information was shared with Dr. Aura Dials, NP. Both psychiatric providers recommended inpatient treatment ([address]).

## 2014-11-26 NOTE — Progress Notes (Signed)
Pt with listed pcp as [facility] with [facility] contact number for July 2016  Pt lying in bed resting when Cm approached pt's room [facility] CNA stated pt recently calmed down and had incident earlier in am of throwing coffee at the call bell system causing it to alarm and flashing of nursing light above the room door to be constantly activated  Pt did not respond but Cm noted rise and fall of her chest when Cm went in her room to leave her information on her bedside table for uninsured accepting pcps, discussed the importance of pcp vs EDP services for f/u care, www.needymeds.org, www.goodrx.com, discounted pharmacies and other [employer] such as [employer] , [employer], affordable care act, financial assistance, uninsured dental services,  med assist, DSS and  health department  Left  resources for [employer] uninsured accepting pcps like [facility] Kussmaul, family medicine at [employer], [facility], [facility], local urgent care centers, [facility], [facility] family practice, general medical clinics, family services of the [place], [facility] urgent care plus others, medication resources, [facility] out patient pharmacies and housing

## 2014-11-26 NOTE — ED Notes (Signed)
Patient refused vital signs at this time 

## 2014-11-26 NOTE — BH Assessment (Signed)
BHH Assessment Progress Note  Kerry Dory at [facility] reports that they have received referral information for this pt.  They will review her and consider her for admission to their facility.  As of this writing a decision is pending.  Doylene Canning, MA Triage Specialist [phone]

## 2014-11-26 NOTE — BH Assessment (Signed)
BHH Assessment Progress Note  The following facilities have been contacted to seek placement for this pt, with results as noted:  Beds available, information sent, decision pending:  [place]  At capacity:  [facility]  Pt is also still under review at the [facility] unit.   Doylene Canning, MA Triage Specialist [phone]

## 2014-11-26 NOTE — ED Notes (Signed)
Patient calm and cooperative at this time in dayroom. Patient denies SI, HI and AVH at this time. Plan of care discussed with patient. Patient voices no complaints or concerns at this time. Encouragement and support provided and safety maintain. Q 15 min safety checks remain in place.

## 2014-11-26 NOTE — ED Notes (Signed)
Patient came to desk requesting her discharge papers. Patient informed that she was not up for discharge. Patient became argumentative with Clinical research associate and then walked off to dayroom. Safety maintain and q15 min safety checks remain in place.

## 2014-11-27 ENCOUNTER — Inpatient Hospital Stay (HOSPITAL_COMMUNITY): Payer: Medicaid Other | Admitting: Anesthesiology

## 2014-11-27 ENCOUNTER — Emergency Department (HOSPITAL_COMMUNITY): Payer: Medicaid Other

## 2014-11-27 ENCOUNTER — Inpatient Hospital Stay (HOSPITAL_COMMUNITY)
Admission: AD | Admit: 2014-11-27 | Discharge: 2014-11-29 | DRG: 777 | Disposition: A | Discharge status: Other Acute Inpatient Hospital | Payer: Medicaid Other | Source: Ambulatory Visit | Attending: Obstetrics and Gynecology | Admitting: Obstetrics and Gynecology

## 2014-11-27 ENCOUNTER — Encounter (HOSPITAL_COMMUNITY)
Admission: AD | Disposition: A | Discharge status: Other Acute Inpatient Hospital | Payer: Self-pay | Source: Ambulatory Visit | Attending: Obstetrics and Gynecology

## 2014-11-27 ENCOUNTER — Encounter (HOSPITAL_COMMUNITY): Payer: Self-pay | Admitting: *Deleted

## 2014-11-27 DIAGNOSIS — Z3A12 12 weeks gestation of pregnancy: Secondary | ICD-10-CM | POA: Diagnosis present

## 2014-11-27 DIAGNOSIS — F17210 Nicotine dependence, cigarettes, uncomplicated: Secondary | ICD-10-CM | POA: Diagnosis present

## 2014-11-27 DIAGNOSIS — F200 Paranoid schizophrenia: Secondary | ICD-10-CM | POA: Diagnosis present

## 2014-11-27 DIAGNOSIS — O00109 Unspecified tubal pregnancy without intrauterine pregnancy: Secondary | ICD-10-CM | POA: Diagnosis present

## 2014-11-27 DIAGNOSIS — I10 Essential (primary) hypertension: Secondary | ICD-10-CM | POA: Diagnosis present

## 2014-11-27 DIAGNOSIS — Z9114 Patient's other noncompliance with medication regimen: Secondary | ICD-10-CM

## 2014-11-27 DIAGNOSIS — O001 Tubal pregnancy: Secondary | ICD-10-CM

## 2014-11-27 DIAGNOSIS — K661 Hemoperitoneum: Secondary | ICD-10-CM | POA: Diagnosis present

## 2014-11-27 DIAGNOSIS — O00101 Right tubal pregnancy without intrauterine pregnancy: Secondary | ICD-10-CM

## 2014-11-27 DIAGNOSIS — O009 Ectopic pregnancy, unspecified: Secondary | ICD-10-CM | POA: Diagnosis present

## 2014-11-27 HISTORY — PX: LAPAROTOMY: SHX154

## 2014-11-27 LAB — CBC
HCT: 31.5 % — ABNORMAL LOW (ref 36.0–46.0)
Hemoglobin: 10.9 g/dL — ABNORMAL LOW (ref 12.0–15.0)
MCH: 30.4 pg (ref 26.0–34.0)
MCHC: 34.6 g/dL (ref 30.0–36.0)
MCV: 88 fL (ref 78.0–100.0)
Platelets: 289 10*3/uL (ref 150–400)
RBC: 3.58 MIL/uL — ABNORMAL LOW (ref 3.87–5.11)
RDW: 13.7 % (ref 11.5–15.5)
WBC: 7.6 10*3/uL (ref 4.0–10.5)

## 2014-11-27 LAB — PREPARE RBC (CROSSMATCH)

## 2014-11-27 SURGERY — LAPAROTOMY, EXPLORATORY
Anesthesia: General | Site: Abdomen

## 2014-11-27 MED ORDER — SODIUM CHLORIDE 0.9 % IJ SOLN
INTRAMUSCULAR | Status: DC | PRN
  Administered 2014-11-27: 20 mL via INTRAVENOUS

## 2014-11-27 MED ORDER — SODIUM CHLORIDE 0.9 % IV SOLN
INTRAVENOUS | Status: DC | PRN
  Administered 2014-11-27: 22:00:00 via INTRAVENOUS

## 2014-11-27 MED ORDER — LABETALOL HCL 5 MG/ML IV SOLN
INTRAVENOUS | Status: DC | PRN
  Administered 2014-11-27: 5 mg via INTRAVENOUS

## 2014-11-27 MED ORDER — CEFAZOLIN SODIUM-DEXTROSE 2-3 GM-% IV SOLR
2.0000 g | Freq: Three times a day (TID) | INTRAVENOUS | Status: DC
  Administered 2014-11-27 – 2014-11-28 (×2): 2 g via INTRAVENOUS
  Filled 2014-11-27 (×3): qty 50

## 2014-11-27 MED ORDER — MEPERIDINE HCL 25 MG/ML IJ SOLN: 6.2500 mg | INTRAMUSCULAR | Status: DC | PRN

## 2014-11-27 MED ORDER — GLYCOPYRROLATE 0.2 MG/ML IJ SOLN
INTRAMUSCULAR | Status: AC
  Filled 2014-11-27: qty 4

## 2014-11-27 MED ORDER — PRENATAL MULTIVITAMIN CH
1.0000 | ORAL_TABLET | Freq: Every day | ORAL | Status: DC
  Filled 2014-11-27 (×2): qty 1

## 2014-11-27 MED ORDER — GLYCOPYRROLATE 0.2 MG/ML IJ SOLN
INTRAMUSCULAR | Status: DC | PRN
  Administered 2014-11-27: 1 mg via INTRAVENOUS

## 2014-11-27 MED ORDER — FUROSEMIDE 10 MG/ML IJ SOLN
INTRAMUSCULAR | Status: AC
  Filled 2014-11-27: qty 2

## 2014-11-27 MED ORDER — FENTANYL CITRATE (PF) 250 MCG/5ML IJ SOLN
INTRAMUSCULAR | Status: AC
  Filled 2014-11-27: qty 25

## 2014-11-27 MED ORDER — PROPOFOL 10 MG/ML IV BOLUS
INTRAVENOUS | Status: DC | PRN
  Administered 2014-11-27: 150 mg via INTRAVENOUS

## 2014-11-27 MED ORDER — NEOSTIGMINE METHYLSULFATE 10 MG/10ML IV SOLN
INTRAVENOUS | Status: AC
  Filled 2014-11-27: qty 1

## 2014-11-27 MED ORDER — ONDANSETRON HCL 4 MG/2ML IJ SOLN
INTRAMUSCULAR | Status: AC
  Filled 2014-11-27: qty 2

## 2014-11-27 MED ORDER — SODIUM CHLORIDE 0.9 % IJ SOLN
INTRAMUSCULAR | Status: AC
  Filled 2014-11-27: qty 50

## 2014-11-27 MED ORDER — PROPOFOL 10 MG/ML IV BOLUS
INTRAVENOUS | Status: AC
  Filled 2014-11-27: qty 20

## 2014-11-27 MED ORDER — HYDROMORPHONE HCL 1 MG/ML IJ SOLN
0.2500 mg | INTRAMUSCULAR | Status: DC | PRN
  Administered 2014-11-28: 0.5 mg via INTRAVENOUS

## 2014-11-27 MED ORDER — MIDAZOLAM HCL 5 MG/5ML IJ SOLN
INTRAMUSCULAR | Status: DC | PRN
  Administered 2014-11-27: 2 mg via INTRAVENOUS

## 2014-11-27 MED ORDER — KETOROLAC TROMETHAMINE 30 MG/ML IJ SOLN
30.0000 mg | Freq: Once | INTRAMUSCULAR | Status: AC
  Administered 2014-11-28: 30 mg via INTRAVENOUS

## 2014-11-27 MED ORDER — LIDOCAINE HCL (CARDIAC) 20 MG/ML IV SOLN
INTRAVENOUS | Status: AC
  Filled 2014-11-27: qty 5

## 2014-11-27 MED ORDER — BUPIVACAINE LIPOSOME 1.3 % IJ SUSP
20.0000 mL | Freq: Once | INTRAMUSCULAR | Status: AC
  Administered 2014-11-27: 20 mL
  Filled 2014-11-27: qty 20

## 2014-11-27 MED ORDER — LACTATED RINGERS IV SOLN
INTRAVENOUS | Status: DC | PRN
  Administered 2014-11-27: 23:00:00 via INTRAVENOUS

## 2014-11-27 MED ORDER — 0.9 % SODIUM CHLORIDE (POUR BTL) OPTIME
TOPICAL | Status: DC | PRN
  Administered 2014-11-27: 1000 mL

## 2014-11-27 MED ORDER — PROMETHAZINE HCL 25 MG/ML IJ SOLN: 6.2500 mg | INTRAMUSCULAR | Status: DC | PRN

## 2014-11-27 MED ORDER — LABETALOL HCL 5 MG/ML IV SOLN
INTRAVENOUS | Status: AC
  Filled 2014-11-27: qty 4

## 2014-11-27 MED ORDER — FAMOTIDINE IN NACL 20-0.9 MG/50ML-% IV SOLN
20.0000 mg | Freq: Once | INTRAVENOUS | Status: AC
  Administered 2014-11-27: 20 mg via INTRAVENOUS
  Filled 2014-11-27: qty 50

## 2014-11-27 MED ORDER — ROCURONIUM BROMIDE 100 MG/10ML IV SOLN
INTRAVENOUS | Status: DC | PRN
  Administered 2014-11-27: 40 mg via INTRAVENOUS

## 2014-11-27 MED ORDER — SODIUM CHLORIDE 0.9 % IV SOLN
Freq: Once | INTRAVENOUS | Status: AC
  Administered 2014-11-27: 22:00:00 via INTRAVENOUS

## 2014-11-27 MED ORDER — KETOROLAC TROMETHAMINE 30 MG/ML IJ SOLN: 30.0000 mg | Freq: Once | INTRAMUSCULAR | Status: AC | PRN

## 2014-11-27 MED ORDER — LURASIDONE HCL 40 MG PO TABS
40.0000 mg | ORAL_TABLET | Freq: Every day | ORAL | Status: DC
  Filled 2014-11-27 (×2): qty 1

## 2014-11-27 MED ORDER — MIDAZOLAM HCL 2 MG/2ML IJ SOLN
INTRAMUSCULAR | Status: AC
  Filled 2014-11-27: qty 4

## 2014-11-27 MED ORDER — LIDOCAINE HCL (CARDIAC) 20 MG/ML IV SOLN
INTRAVENOUS | Status: DC | PRN
  Administered 2014-11-27: 75 mg via INTRAVENOUS

## 2014-11-27 MED ORDER — FENTANYL CITRATE (PF) 100 MCG/2ML IJ SOLN
INTRAMUSCULAR | Status: DC | PRN
  Administered 2014-11-27: 100 ug via INTRAVENOUS
  Administered 2014-11-27: 50 ug via INTRAVENOUS
  Administered 2014-11-27 (×2): 100 ug via INTRAVENOUS

## 2014-11-27 MED ORDER — NEOSTIGMINE METHYLSULFATE 10 MG/10ML IV SOLN
INTRAVENOUS | Status: DC | PRN
  Administered 2014-11-27: 5 mg via INTRAVENOUS

## 2014-11-27 MED ORDER — DEXAMETHASONE SODIUM PHOSPHATE 4 MG/ML IJ SOLN
INTRAMUSCULAR | Status: DC | PRN
Start: 2014-11-27 — End: 2014-11-27
  Administered 2014-11-27: 10 mg via INTRAVENOUS

## 2014-11-27 MED ORDER — LURASIDONE HCL 80 MG PO TABS: 80.0000 mg | ORAL_TABLET | Freq: Every day | ORAL | Status: DC

## 2014-11-27 MED ORDER — DEXAMETHASONE SODIUM PHOSPHATE 10 MG/ML IJ SOLN
INTRAMUSCULAR | Status: AC
  Filled 2014-11-27: qty 1

## 2014-11-27 MED ORDER — ROCURONIUM BROMIDE 100 MG/10ML IV SOLN
INTRAVENOUS | Status: AC
  Filled 2014-11-27: qty 1

## 2014-11-27 MED ORDER — OLANZAPINE 10 MG PO TBDP: 10.0000 mg | ORAL_TABLET | Freq: Three times a day (TID) | ORAL | Status: DC | PRN

## 2014-11-27 MED ORDER — LACTATED RINGERS IV BOLUS (SEPSIS)
1000.0000 mL | Freq: Once | INTRAVENOUS | Status: AC
  Administered 2014-11-27: 1000 mL via INTRAVENOUS

## 2014-11-27 SURGICAL SUPPLY — 43 items
APL SKNCLS STERI-STRIP NONHPOA (GAUZE/BANDAGES/DRESSINGS) ×1
BENZOIN TINCTURE PRP APPL 2/3 (GAUZE/BANDAGES/DRESSINGS) ×2 IMPLANT
BLADE SURG 10 STRL SS (BLADE) ×6 IMPLANT
CANISTER SUCT 3000ML (MISCELLANEOUS) ×6 IMPLANT
CELLS DAT CNTRL 66122 CELL SVR (MISCELLANEOUS) ×1 IMPLANT
CLOSURE WOUND 1/2 X4 (GAUZE/BANDAGES/DRESSINGS) ×1
CLOTH BEACON ORANGE TIMEOUT ST (SAFETY) ×3 IMPLANT
CONT PATH 16OZ SNAP LID 3702 (MISCELLANEOUS) ×3 IMPLANT
DRAPE CESAREAN BIRTH W POUCH (DRAPES) ×2 IMPLANT
DRAPE WARM FLUID 44X44 (DRAPE) IMPLANT
DRSG OPSITE POSTOP 4X10 (GAUZE/BANDAGES/DRESSINGS) ×2 IMPLANT
GAUZE SPONGE 4X4 16PLY XRAY LF (GAUZE/BANDAGES/DRESSINGS) ×3 IMPLANT
GLOVE BIOGEL PI IND STRL 9 (GLOVE) ×2 IMPLANT
GLOVE BIOGEL PI INDICATOR 9 (GLOVE) ×4
GLOVE ECLIPSE 9.0 STRL (GLOVE) ×3 IMPLANT
GOWN STRL REUS W/TWL 2XL LVL3 (GOWN DISPOSABLE) ×3 IMPLANT
GOWN STRL REUS W/TWL LRG LVL3 (GOWN DISPOSABLE) ×3 IMPLANT
NS IRRIG 1000ML POUR BTL (IV SOLUTION) ×3 IMPLANT
PACK ABDOMINAL GYN (CUSTOM PROCEDURE TRAY) ×3 IMPLANT
PAD OB MATERNITY 4.3X12.25 (PERSONAL CARE ITEMS) ×3 IMPLANT
RETRACTOR WND ALEXIS 18 MED (MISCELLANEOUS) IMPLANT
RETRACTOR WND ALEXIS 25 LRG (MISCELLANEOUS) IMPLANT
RTRCTR WOUND ALEXIS 18CM MED (MISCELLANEOUS) ×3
RTRCTR WOUND ALEXIS 25CM LRG (MISCELLANEOUS)
SLEEVE SCD COMPRESS KNEE MED (MISCELLANEOUS) ×2 IMPLANT
SPONGE LAP 18X18 X RAY DECT (DISPOSABLE) IMPLANT
STAPLER VISISTAT 35W (STAPLE) ×1 IMPLANT
STRIP CLOSURE SKIN 1/2X4 (GAUZE/BANDAGES/DRESSINGS) ×1 IMPLANT
SUT CHROMIC 0 CT 1 (SUTURE) IMPLANT
SUT CHROMIC 2 0 CT 1 (SUTURE) ×2 IMPLANT
SUT CHROMIC 2 0 SH (SUTURE) IMPLANT
SUT CHROMIC GUT AB #0 18 (SUTURE) IMPLANT
SUT PLAIN 2 0 (SUTURE)
SUT PLAIN ABS 2-0 CT1 27XMFL (SUTURE) IMPLANT
SUT PROLENE 0 CT 1 30 (SUTURE) IMPLANT
SUT VIC AB 0 CT1 27 (SUTURE) ×9
SUT VIC AB 0 CT1 27XBRD ANBCTR (SUTURE) ×2 IMPLANT
SUT VIC AB 2-0 CT1 27 (SUTURE) ×6
SUT VIC AB 2-0 CT1 TAPERPNT 27 (SUTURE) IMPLANT
SUT VIC AB 4-0 KS 27 (SUTURE) ×2 IMPLANT
TOWEL OR 17X24 6PK STRL BLUE (TOWEL DISPOSABLE) ×6 IMPLANT
TRAY FOLEY CATH SILVER 14FR (SET/KITS/TRAYS/PACK) ×3 IMPLANT
WATER STERILE IRR 1000ML POUR (IV SOLUTION) ×1 IMPLANT

## 2014-11-27 NOTE — ED Notes (Signed)
Pt refused vital signs. RN notified. Pt appeared agitated after this Clinical research associate asked. Pt stated "No. And don't knock on my door again."

## 2014-11-27 NOTE — Anesthesia Preprocedure Evaluation (Addendum)
Anesthesia Evaluation    Reviewed: Allergy & Precautions, H&P , Patient's Chart, lab work & pertinent test results, reviewed documented beta blocker date and time   Airway Mallampati: I  TM Distance: >3 FB Neck ROM: full    Dental no notable dental hx. (+) Teeth Intact   Pulmonary Current Smoker,    Pulmonary exam normal       Cardiovascular hypertension, Normal cardiovascular exam    Neuro/Psych PSYCHIATRIC DISORDERS Schizophrenia negative neurological ROS     GI/Hepatic negative GI ROS, Neg liver ROS,   Endo/Other  negative endocrine ROS  Renal/GU negative Renal ROS     Musculoskeletal   Abdominal Normal abdominal exam  (+)   Peds  Hematology negative hematology ROS (+)   Anesthesia Other Findings   Reproductive/Obstetrics negative OB ROS                           Anesthesia Physical Anesthesia Plan  ASA: II  Anesthesia Plan: General   Post-op Pain Management:    Induction: Intravenous  Airway Management Planned: Oral ETT  Additional Equipment:   Intra-op Plan:   Post-operative Plan: Extubation in OR  Informed Consent: I have reviewed the patients History and Physical, chart, labs and discussed the procedure including the risks, benefits and alternatives for the proposed anesthesia with the patient or authorized representative who has indicated his/her understanding and acceptance.   Dental Advisory Given  Plan Discussed with: CRNA and Surgeon  Anesthesia Plan Comments:         Anesthesia Quick Evaluation

## 2014-11-27 NOTE — Progress Notes (Signed)
Pt referred to [facility]. [facility] Auth [id number].  Olga Coaster, LCSW  Clinical Social Work  [employer] [phone]

## 2014-11-27 NOTE — MAU Note (Signed)
Sent from [facility]- living ectopic. Pt for surgery

## 2014-11-27 NOTE — BHH Counselor (Signed)
Pending review for possible placement with [facility].  

## 2014-11-27 NOTE — BH Assessment (Signed)
BHH Assessment Progress Note  At 09:10 I spoke to Jacki Cones at [facility].  She reports that pt has been declined admission to their facility by Caryn Section, MD, who believes that pt is too psychotic for their unit at this time.  Doylene Canning, MA Triage Specialist [phone]

## 2014-11-27 NOTE — ED Notes (Addendum)
Patient got up out up bed and pulled code blue call bell several times. Patient asked if she needed anything patient became verbally aggressive to Clinical research associate. Writer tried to instruct patient to use regular call bell and patient yelled at [employer] "get the hell out of my room". Encouragement and support offered and safety maintain. Q 15 min safety checks remain in place.

## 2014-11-27 NOTE — BH Assessment (Addendum)
BHH Assessment Progress Note  Pt has been referred to [facility]  Robinette took demographics by telephone, after which referral information was faxed.  Vivien Rossetti called back, reporting that she had received information and requesting ultrasound.  Dahlia Byes, NP was informed of this and agreed to order ultrasound.  Results are pending as of this writing.  Final decision by [facility] is also pending.  Doylene Canning, MA Triage Specialist [phone]

## 2014-11-27 NOTE — Progress Notes (Signed)
[name] met with patient at bedside to get information regarding support system. However, the patient would not give [name] any information.   Patient will soon be transported to [facility] due to complications related to pregnancy.   Willette Brace [phone] ED [name] 11/27/2014 4:18 PM

## 2014-11-27 NOTE — Consult Note (Signed)
  Psychiatric Specialty Exam: Physical Exam  ROS  Blood pressure 117/75, pulse 68, temperature 98.3 F (36.8 C), temperature source Oral, resp. rate 18, SpO2 95 %.There is no weight on file to calculate BMI.  General Appearance: Disheveled  Eye Contact:: Minimal  Speech: Normal Rate  Volume: Normal  Mood: Angry and Irritable  Affect: Congruent  Thought Process: Irrelevant  Orientation: Full (Time, Place, and Person)  Thought Content: Delusions and Paranoid Ideation  Suicidal Thoughts: No  Homicidal Thoughts: No  Memory: Immediate; Fair Recent; Fair Remote; Fair  Judgement: Impaired  Insight: Lacking  Psychomotor Activity: Decreased  Concentration: Fair  Recall: [employer] of Knowledge:Fair  Language: Fair  Akathisia: No  Handed: Right  AIMS (if indicated):    Assets: Leisure Time Physical Health Resilience  ADL's: Intact  Cognition: WNL         Sleep:      Patient was seen in her room lying down.  She became angry and agitated during our interview with her.  Patient asked to be discharged home and was informed that she needs inpatient hospitalization before going home.  Patient became even more angry and did not want to answer questions.  Patient then asked providers to leave her room.  She threw her bedside table across the room, threw her breakfast across the room.  She is aware she is pregnant but has  not established an OBGYN care.    Paranoid schizophrenia   Plan: Continue plan of care, seek placement at an inpatient Psychiatric unit. Increase Latuda 80 mg po bid with meals for Schizophrenia, Olanzapine Zydis 10 mg every 8 hours as needed for agitation.  We will [facility] for possible [facility] treatment for her for Psychosis.  Dahlia Byes   PMHNP-BC Patient seen face-to-face for psychiatric evaluation, chart reviewed and case discussed with the physician extender and developed treatment  plan. Reviewed the information documented and agree with the treatment plan. Thedore Mins, MD

## 2014-11-27 NOTE — Op Note (Signed)
Please see the details of operation including the brief operative note blood type O positive

## 2014-11-27 NOTE — BH Assessment (Signed)
Reassessment Met with pt for reassessment. Last night pt was at the nurse's station demanding to see her discharge paperwork. She became argumentative with staff when told she was not up for discharge. Prior to reassessment pt pulled code blue button in her room, and then cursed at staff demanding they get out of her room.   When attempting reassessment pt was minimally cooperative. She kept face mostly hidden under the blanket. She reports she has not been eating or sleeping well since being in the ED. She denies SI, HI, and reports she has no anxiety or depression.    8-3 Per Patriciaann Clan, PA pt continues to meet inpt criteria. [name] to continue seeking placement.     Lear Ng, [facility] Triage Specialist 11/27/2014 6:09 AM

## 2014-11-27 NOTE — ED Provider Notes (Signed)
4:13 PM Patient is a 44 year old female with history of paranoid schizophrenia presenting with delusional and bizarre behavior. Patient was seen by initial provider and found to be pregnant. Ultrasound was ordered at that time. However patient refused ultrasound. We were finally able to convince patient to get ultrasound at 3 PM today.  TVUS ultrasound found a 12 week live gestation in the adnexa.  [facility] was called and spoke with Dr. Debroah Loop. It was decided among social work and physicians that patient was already [employer]. Therefore we do not need permission for transfer or consent for procedure.  Courteney Randall An, MD 11/27/14 1615

## 2014-11-27 NOTE — MAU Note (Signed)
IV stared with some assistance from Dr. Emelda Fear and PACU nurse to help restrain pt. Pt did not resit much and has not tried to take the IV out. Wrapped IV with kerlex gauze.

## 2014-11-27 NOTE — MAU Note (Signed)
Dr. Emelda Fear attempted to get pt to talk and knowledge the necessity of surgery. Pt will not talk . In fetal position will not let you touch her.

## 2014-11-27 NOTE — BH Assessment (Signed)
BHH Assessment Progress Note  The following facilities have been contacted to seek placement for this pt, with results as noted:  Beds available, information sent, decision pending:  [place] [facility]  Information faxed prior to today, decision still pending:  [facility] available, numerous attempts made to fax information without success:  [facility]  At capacity:  [facility]  Declined::  [facility]   Doylene Canning, [place] Triage Specialist [phone]

## 2014-11-27 NOTE — Brief Op Note (Signed)
11/27/2014  11:46 PM  PATIENT:  Holly Hines  44 y.o. female  PRE-OPERATIVE DIAGNOSIS:  12 week right ectopic pregnancy  POST-OPERATIVE DIAGNOSIS:  12 week right ectopic pregnancy, ruptured  PROCEDURE:  Procedure(s): EXPLORATORY LAPAROTOMY, Right Salpingectomy with Removal Ectopic Pregnancy (N/A)  SURGEON:  Surgeon(s) and Role:    * Tilda Burrow, MD - Primary    * Tereso Newcomer, MD - Assisting  PHYSICIAN ASSISTANTMacon Large M.D.  ASSISTANTS: none   ANESTHESIA:   local and general  EBL:  Total I/O In: 1800 [I.V.:1800] Out: 500 [Urine:250; Blood:250]  BLOOD ADMINISTERED:none  DRAINS: Urinary Catheter (Foley)   LOCAL MEDICATIONS USED:  OTHER Exparel 40 cc  SPECIMEN:  Source of Specimen:  Right fallopian tube and ectopic  DISPOSITION OF SPECIMEN:  PATHOLOGY  COUNTS:  YES  TOURNIQUET:  * No tourniquets in log *  DICTATION: .Dragon Dictation  PLAN OF CARE: Admit to inpatient   PATIENT DISPOSITION:  PACU - hemodynamically stable.   Delay start of Pharmacological VTE agent (>24hrs) due to surgical blood loss or risk of bleeding: not applicable Details of procedure: Patient was taken to the operating room prepped and draped lower abdominal surgery, with timeout conducted. Ancef was administered. Uterus lower abdominal incision was performed and the method of Pfannenstiel and was notable for thick fibrotic scar tissue at the level of the fascia and with difficulty dissecting the fascia off of the underlying rectus muscles. The peritoneal cavity was entered in the midline, and blood encountered. Midline opening was extended inferiorly and several large clots up to 50 cc, 4 cm in diameter were extracted from the pelvis. Suctioning of blood removed 250 cc of thick blood that appeared be fresh plus a small amount of old fibrinous material suggesting previous bleeding. Alexis wound retractor was positioned and then reaching down beneath the appendectomy, right the  ectopic ruptured and the fetus was expelled. And was quickly suctioned, the corner of the uterus elevated to [facility] clamps placed beneath the ectopic across the mesosalpinx and then tied down with 0 Vicryl from each and resulting in good hemostasis that was improved with an additional oversewing of the proximal stump with 0 Vicryl and one additional mattress suture which resulted in good hemostasis. Pelvis was then copiously irrigated with approximately thousand cc of saline including irrigating the upper abdomen followed by obtaining clear fluid. Pedicles were inspected and confirmed as hemostatic. The right ovary had some fibrinous material which could not be removed but was otherwise grossly normal. Left tube and ovary were grossly normal. Uterus is moderately enlarged with fibroid irregularity noted. Estimated uterine size was 200 250 g. After irrigation and removal of laparotomy equipment, the anterior peritoneum was closed running 2-0 chromic, fascia closed with running 0 Vicryl, subcutaneous tissues reapproximated with interrupted sutures of 2-0 Vicryl followed by subcuticular 4-0 Vicryl skin closure strips were applied and patient to recovery room in stable condition Exparel was injected into the fascia and subcutaneous tissues for long-term comfort postoperatively. Sponge and needle counts correct

## 2014-11-27 NOTE — MAU Note (Signed)
atempted to talk to pt again aboutt surgery and need for IV and lab work. Pt refusing.

## 2014-11-27 NOTE — ED Notes (Signed)
Ultrasound done earlier this shift to evaluate pregnancy progression. New order--consult-- received to transfer pt to [facility] for OBGY services due to Ectopic pregnancy. Pt transported by [facility] and ambulance. Pt's mood remains labile with flat / depressed affect.  A: Pt informed of transfer to [facility]. Report called to [facility], RN at 1630. Vitals attempted earlier but to no avail as pt continued to refused for days now, [facility] NP made aware. Support and encouragement provided. Q 15 minutes checks maintained for pt's safety till time of transfer to [facility]. R: Pt hesistant about transfer on initial approach, stated "I'm not going to another [facility], y'all didn't care about me anyway by keeping me here. Pt was transported to stretcher via bed sheets. Continue plan of care.

## 2014-11-27 NOTE — MAU Note (Signed)
Pt transferred from [facility] holding with dx of ectopic pregnancy. Pt is refusing v/s and not talking. Sitter at bedside after [employer] left after transport.

## 2014-11-27 NOTE — MAU Provider Note (Signed)
History     CSN: [id number]  Arrival date and time: 11/27/14 1659   None     No chief complaint on file. PARANOID SCHIZOPHRENIA UNRUPTURED RIGHT ECTOPIC PREGNANCY 12 WEEKS, SUSPECTED CORNUAL OR INTERSTITIAL ECTOPIC. HPI 44 YR old female, transferred to [facility] from Psychiatric services lockdown area at [facility], after u/s diagnosis today of right ectopic pregnancy with advanced pregnancy to 12 weeks, with fetal activity and large gestational sac. To my review this appears to be cornual or interstitial. Currently the patient has no  evidence of rupture,l and does not complain of any pain, though pt is minimally cooperative with questioning, with delusional comments, indicating that razor blades were placed in her at her prior care. (?[facility] care). Currently the patient remains uncooperative with care, and we have not been able to get her cooperation with blood drawing or IV access.  Contact with Dr Lucianne Muss regarding psychiatric issues has been initiated. Pt has involuntary commitment papers in place , initially authorized by courts on 7/28 at 12:25 pm, for 7 days, which makes 8/3 the 6th day of commitment. Psychiatry Behavioral Health has reviewed the Involuntary Commitment(IVC) and the current papers are effective thru 8:30 am on 11/29/2014.    We will attempt to get IV access and labs, and if so, will plan to proceed tonight with the necessary removal of ectopic pregnancy, before rupture further complicates care. Dr Lucianne Muss to return call after her review of papers. Anesthesia updated of pt condition, issues, and have seen pt who refused to interact. Pertinent Gynecological History: Menses:  Bleeding: none at present Contraception:  DES exposure: unknown Blood transfusions: none Sexually transmitted diseases:  Previous GYN Procedures: cesarean section  9 yrs ago  When pt alleges razors were put in to her tubes. Last mammogram:  Date:  Last pap:  Date:    Past Medical History   Diagnosis Date  . Mental disorder   . Pregnant   . Hypertension     Past Surgical History  Procedure Laterality Date  . Cesarean section  9 yrs ago     No family history on file.  History  Substance Use Topics  . Smoking status: Current Some Day Smoker  . Smokeless tobacco: Not on file  . Alcohol Use: Not on file    Allergies:  Allergies  Allergen Reactions  . Pollen Extract Other (See Comments)    unknown    Prescriptions prior to admission  Medication Sig Dispense Refill Last Dose  . polyethylene glycol powder (GLYCOLAX/MIRALAX) powder Take 17 g by mouth 2 (two) times daily. Until daily soft stools  OTC (Patient not taking: Reported on 07/01/2014) 255 g 0 Not Taking at Unknown time    ROS NO Homicidal or Suicidal ideation at last Psychiatry eval 7/29. Patient refuses to cooperate with phlebotomy or IV access.  Physical Exam   There were no vitals taken for this visit.  Physical Exam  Constitutional: She appears well-developed and well-nourished.  Resting comfortably in bed , on right side, unlabored breath, without pain  HENT:  Head: Normocephalic.  Eyes:  Unable to eval  Respiratory: Effort normal.  GI: Soft.  Genitourinary:  Unable to examine.  Ultrasound from today reviewed.  Musculoskeletal: Normal range of motion.  Neurological: She is alert.  Skin: Skin is warm and dry.    MAU Course  Procedures  MDM Review of u/s ,consultation with Psychiatry coordination of surgery  Assessment and Plan  Unruptured right ectopic pregnancy, suspected interstitial or cornual.  To OR for laparotomy and removal of right ectopic pregnancy.  2-physician consents to be obtained. Dr Macon Large to assist.  Tilda Burrow 11/27/2014, 6:52 PM

## 2014-11-27 NOTE — Transfer of Care (Signed)
Immediate Anesthesia Transfer of Care Note  Patient: Holly Hines  Procedure(s) Performed: Procedure(s): EXPLORATORY LAPAROTOMY, Right Salpingectomy with Removal Ectopic Pregnancy (N/A)  Patient Location: PACU  Anesthesia Type:General  Level of Consciousness: awake, alert  and oriented  Airway & Oxygen Therapy: Patient Spontanous Breathing and Patient connected to nasal cannula oxygen  Post-op Assessment: Report given to RN and Post -op Vital signs reviewed and stable  Post vital signs: Reviewed and stable  Last Vitals:  Filed Vitals:    Complications: No apparent anesthesia complications

## 2014-11-27 NOTE — H&P (Signed)
Holly Burrow, MD Physician Signed Gynecology MAU Provider Note 11/27/2014 6:52 PM    Expand All Collapse All    History     CSN: [id number]  Arrival date and time: 11/27/14 1659  None    No chief complaint on file. PARANOID SCHIZOPHRENIA UNRUPTURED RIGHT ECTOPIC PREGNANCY 12 WEEKS, SUSPECTED CORNUAL OR INTERSTITIAL ECTOPIC. HPI 44 YR old female, transferred to [facility] from Psychiatric services lockdown area at [facility], after u/s diagnosis today of right ectopic pregnancy with advanced pregnancy to 12 weeks, with fetal activity and large gestational sac. To my review this appears to be cornual or interstitial. Currently the patient has no evidence of rupture,l and does not complain of any pain, though pt is minimally cooperative with questioning, with delusional comments, indicating that razor blades were placed in her at her prior care. (?[facility] care). Currently the patient remains uncooperative with care, and we have not been able to get her cooperation with blood drawing or IV access.  Contact with Dr Lucianne Muss regarding psychiatric issues has been initiated. Pt has involuntary commitment papers in place , initially authorized by courts on 7/28 at 12:25 pm, for 7 days, which makes 8/3 the 6th day of commitment. Psychiatry Behavioral Health has reviewed the Involuntary Commitment(IVC) and the current papers are effective thru 8:30 am on 11/29/2014.   We will attempt to get IV access and labs, and if so, will plan to proceed tonight with the necessary removal of ectopic pregnancy, before rupture further complicates care. Dr Lucianne Muss to return call after her review of papers. Anesthesia updated of pt condition, issues, and have seen pt who refused to interact. Pertinent Gynecological History: Menses:  Bleeding: none at present Contraception:  DES exposure: unknown Blood transfusions: none Sexually transmitted diseases:  Previous GYN Procedures: cesarean section 9 yrs ago  When pt alleges razors were put in to her tubes. Last mammogram: Date:  Last pap: Date:    Past Medical History  Diagnosis Date  . Mental disorder   . Pregnant   . Hypertension     Past Surgical History  Procedure Laterality Date  . Cesarean section  9 yrs ago     No family history on file.  History  Substance Use Topics  . Smoking status: Current Some Day Smoker  . Smokeless tobacco: Not on file  . Alcohol Use: Not on file    Allergies:  Allergies  Allergen Reactions  . Pollen Extract Other (See Comments)    unknown    Prescriptions prior to admission  Medication Sig Dispense Refill Last Dose  . polyethylene glycol powder (GLYCOLAX/MIRALAX) powder Take 17 g by mouth 2 (two) times daily. Until daily soft stools  OTC (Patient not taking: Reported on 07/01/2014) 255 g 0 Not Taking at Unknown time    ROS NO Homicidal or Suicidal ideation at last Psychiatry eval 7/29. Patient refuses to cooperate with phlebotomy or IV access.  Physical Exam   There were no vitals taken for this visit.  Physical Exam  Constitutional: She appears well-developed and well-nourished.  Resting comfortably in bed , on right side, unlabored breath, without pain  HENT:  Head: Normocephalic.  Eyes:  Unable to eval  Respiratory: Effort normal.  GI: Soft.  Genitourinary:  Unable to examine.  Ultrasound from today reviewed.  Musculoskeletal: Normal range of motion.  Neurological: She is alert.  Skin: Skin is warm and dry.    MAU Course  Procedures  MDM Review of u/s ,consultation with Psychiatry coordination of  surgery  Assessment and Plan  Unruptured right ectopic pregnancy, suspected interstitial or cornual.  To OR for laparotomy and removal of right ectopic pregnancy.  2-physician consents to be obtained. Dr Macon Large to assist.  Holly Hines 11/27/2014, 6:52 PM

## 2014-11-28 DIAGNOSIS — F200 Paranoid schizophrenia: Secondary | ICD-10-CM

## 2014-11-28 DIAGNOSIS — O00101 Right tubal pregnancy without intrauterine pregnancy: Secondary | ICD-10-CM

## 2014-11-28 DIAGNOSIS — K661 Hemoperitoneum: Secondary | ICD-10-CM | POA: Diagnosis present

## 2014-11-28 LAB — BASIC METABOLIC PANEL
Anion gap: 6 (ref 5–15)
BUN: <5 mg/dL — ABNORMAL LOW (ref 6–20)
CO2: 22 mmol/L (ref 22–32)
Calcium: 8.1 mg/dL — ABNORMAL LOW (ref 8.9–10.3)
Chloride: 108 mmol/L (ref 101–111)
Creatinine, Ser: 0.6 mg/dL (ref 0.44–1.00)
GFR calc Af Amer: >60 mL/min (ref >60)
GFR calc non Af Amer: >60 mL/min (ref >60)
Glucose, Bld: 133 mg/dL — ABNORMAL HIGH (ref 65–99)
Potassium: 4 mmol/L (ref 3.5–5.1)
Sodium: 136 mmol/L (ref 135–145)

## 2014-11-28 LAB — CBC
HCT: 27.3 % — ABNORMAL LOW (ref 36.0–46.0)
Hemoglobin: 9.2 g/dL — ABNORMAL LOW (ref 12.0–15.0)
MCH: 30 pg (ref 26.0–34.0)
MCHC: 33.7 g/dL (ref 30.0–36.0)
MCV: 88.9 fL (ref 78.0–100.0)
Platelets: 253 10*3/uL (ref 150–400)
RBC: 3.07 MIL/uL — ABNORMAL LOW (ref 3.87–5.11)
RDW: 14 % (ref 11.5–15.5)
WBC: 4.9 10*3/uL (ref 4.0–10.5)

## 2014-11-28 MED ORDER — KETOROLAC TROMETHAMINE 30 MG/ML IJ SOLN: 30.0000 mg | Freq: Four times a day (QID) | INTRAMUSCULAR | Status: DC

## 2014-11-28 MED ORDER — HYDROMORPHONE HCL 1 MG/ML IJ SOLN: 1.0000 mg | INTRAMUSCULAR | Status: DC | PRN

## 2014-11-28 MED ORDER — RISPERIDONE 1 MG PO TABS
1.0000 mg | ORAL_TABLET | Freq: Every day | ORAL | Status: DC
  Filled 2014-11-28: qty 1

## 2014-11-28 MED ORDER — LABETALOL HCL 5 MG/ML IV SOLN
10.0000 mg | Freq: Once | INTRAVENOUS | Status: AC
  Administered 2014-11-28: 10 mg via INTRAVENOUS

## 2014-11-28 MED ORDER — KETOROLAC TROMETHAMINE 30 MG/ML IJ SOLN
INTRAMUSCULAR | Status: AC
  Filled 2014-11-28: qty 1

## 2014-11-28 MED ORDER — OXYCODONE-ACETAMINOPHEN 5-325 MG PO TABS
1.0000 | ORAL_TABLET | ORAL | Status: DC | PRN
  Filled 2014-11-28: qty 1

## 2014-11-28 MED ORDER — HYDROMORPHONE HCL 1 MG/ML IJ SOLN
INTRAMUSCULAR | Status: AC
  Filled 2014-11-28: qty 1

## 2014-11-28 MED ORDER — ONDANSETRON HCL 4 MG PO TABS: 4.0000 mg | ORAL_TABLET | Freq: Four times a day (QID) | ORAL | Status: DC | PRN

## 2014-11-28 MED ORDER — IBUPROFEN 600 MG PO TABS
600.0000 mg | ORAL_TABLET | Freq: Four times a day (QID) | ORAL | Status: DC | PRN
  Administered 2014-11-28: 600 mg via ORAL
  Filled 2014-11-28 (×2): qty 1

## 2014-11-28 MED ORDER — SODIUM CHLORIDE 0.9 % IV SOLN
INTRAVENOUS | Status: DC
  Administered 2014-11-28: 02:00:00 via INTRAVENOUS

## 2014-11-28 MED ORDER — PANTOPRAZOLE SODIUM 40 MG PO TBEC: 40.0000 mg | DELAYED_RELEASE_TABLET | Freq: Every day | ORAL | Status: DC

## 2014-11-28 MED ORDER — ONDANSETRON HCL 4 MG/2ML IJ SOLN: 4.0000 mg | Freq: Four times a day (QID) | INTRAMUSCULAR | Status: DC | PRN

## 2014-11-28 NOTE — Anesthesia Postprocedure Evaluation (Signed)
Anesthesia Post Note  Patient: Holly Hines  Procedure(s) Performed: Procedure(s) (LRB): EXPLORATORY LAPAROTOMY, Right Salpingectomy with Removal Ectopic Pregnancy (N/A)  Anesthesia type: General  Patient location: PACU  Post pain: Pain level controlled  Post assessment: Post-op Vital signs reviewed  Last Vitals:  Filed Vitals:   11/27/14 2345  BP: 152/81  Pulse: 66  Temp: 36.6 C  Resp: 14    Post vital signs: Reviewed  Level of consciousness: sedated  Complications: No apparent anesthesia complications

## 2014-11-28 NOTE — Addendum Note (Signed)
Addendum  created 11/28/14 0932 by Yolonda Kida, CRNA   Modules edited: Notes Section   Notes Section:  File: [id number]

## 2014-11-28 NOTE — Consult Note (Signed)
[facility] Psychiatry Consult   Reason for Consult: delusional, paranoid, status post surgery related ectopic pregnancy Referring Physician:  Dr. Glo Herring Patient Identification: Holly Hines MRN:  [id number] Principal Diagnosis: Schizophrenia, paranoid Diagnosis:   Patient Active Problem List   Diagnosis Date Noted  . Hemoperitoneum due to rupture of right tubal ectopic pregnancy [O00.9, K66.1] 11/28/2014  . tubal ectopic pregnancy unruptured, 12 wks [O00.1]   . Acute psychosis [F29]   . Paranoid schizophrenia [F20.0]   . Hypertension in pregnancy, antepartum [O16.9] 10/23/2012  . Schizophrenia, paranoid [F20.0] 10/10/2011    Total Time spent with patient: 60 minutes  Subjective:   Fredrika Canby is a 44 y.o. female seen face-to-face for the psychiatric consultation and evaluation for  bizarre behavior and delusional thinking. patient stated that she is living in a boarding house with a roommate who was never been in contact with patient since she was placed in the hospital about 5-6 days ago. Patient complains he does need four dollars to buy her medication from the [place] which might be reason she's been non-compliant with medication management Patient is also not trying to communicate with anybody outside the hospital.  Patient reported that she was initially admitted to [facility] long emergency department with the involuntary commitment from [facility] for dangerous and destructive/bizarre behaviors. As per the records patient was found lying in the road several times in this week, refusing to eat and drink and noncompliant with medication management. Patient was found pregnant urine hospital routine testing and then found ectopic pregnancy by ultrasonogram. Patient was admitted to the [facility] where she had surgery yesterday. Patient is currently in postoperative surgical care which probably needed at least 48 hours. Patient is poorly communicative,  poor historian and poorly cooperative with the hospital treatment services.  patient was known to this provider from this weekend rounds and the [facility] long emergency department where she was found with a significant irritability,  agitated, bizarre, and hostile, shouting and refusing to answer questions.  patient is also demanding to be discharged from the hospital without appropriate care.   Patient is labile, easily agitated and uncooperative. Patient continues to present as delusional and psychotic. Patient does not endorse suicidal/homicidal ideation, but is clearly psychotic, paranoid, and delusion, and continues to warrant inpatient admission. We'll continue her involuntary commitment petition and certification at this time as it is expiring by tomorrow.  HPI Elements:   Location:  delusions, disorganized behavior. Quality:  poor secondary to noncompliant with medication management. Severity:  Continue to be psychotic, delusional and paranoid with agitation. Timing:  Noncompliant with treatment. Duration:  Few weeks to months. Context:  Severe psychosocial stressors.  6  Past Medical History:  Past Medical History  Diagnosis Date  . Mental disorder   . Pregnant   . Hypertension     Past Surgical History  Procedure Laterality Date  . Cesarean section  9 yrs ago    Family History: History reviewed. No pertinent family history. Social History:  History  Alcohol Use: Not on file     History  Drug Use Not on file    History   Social History  . Marital Status: Single    Spouse Name: N/A  . Number of Children: N/A  . Years of Education: N/A   Social History Main Topics  . Smoking status: Current Some Day Smoker  . Smokeless tobacco: Not on file  . Alcohol Use: Not on file  . Drug Use: Not on file  . Sexual  Activity: Yes   Other Topics Concern  . None   Social History Narrative   Additional Social History:                          Allergies:    Allergies  Allergen Reactions  . Pollen Extract Other (See Comments)    unknown    Labs:  Results for orders placed or performed during the hospital encounter of 11/27/14 (from the past 48 hour(s))  Prepare RBC     Status: None   Collection Time: 11/27/14  8:59 PM  Result Value Ref Range   Order Confirmation ORDER PROCESSED BY BLOOD BANK   CBC     Status: Abnormal   Collection Time: 11/27/14  9:15 PM  Result Value Ref Range   WBC 7.6 4.0 - 10.5 K/uL   RBC 3.58 (L) 3.87 - 5.11 MIL/uL   Hemoglobin 10.9 (L) 12.0 - 15.0 g/dL   HCT 31.5 (L) 36.0 - 46.0 %   MCV 88.0 78.0 - 100.0 fL   MCH 30.4 26.0 - 34.0 pg   MCHC 34.6 30.0 - 36.0 g/dL   RDW 13.7 11.5 - 15.5 %   Platelets 289 150 - 400 K/uL  Type and screen     Status: None (Preliminary result)   Collection Time: 11/27/14  9:15 PM  Result Value Ref Range   ABO/RH(D) O POS    Antibody Screen NEG    Sample Expiration 11/30/2014    Unit Number [id number]    Blood Component Type RBC LR PHER2    Unit division 00    Status of Unit ALLOCATED    Transfusion Status OK TO TRANSFUSE    Crossmatch Result Compatible    Unit Number [id number]    Blood Component Type RED CELLS,LR    Unit division 00    Status of Unit ALLOCATED    Transfusion Status OK TO TRANSFUSE    Crossmatch Result Compatible   CBC     Status: Abnormal   Collection Time: 11/28/14  5:25 AM  Result Value Ref Range   WBC 4.9 4.0 - 10.5 K/uL   RBC 3.07 (L) 3.87 - 5.11 MIL/uL   Hemoglobin 9.2 (L) 12.0 - 15.0 g/dL   HCT 27.3 (L) 36.0 - 46.0 %   MCV 88.9 78.0 - 100.0 fL   MCH 30.0 26.0 - 34.0 pg   MCHC 33.7 30.0 - 36.0 g/dL   RDW 14.0 11.5 - 15.5 %   Platelets 253 150 - 400 K/uL  Basic metabolic panel     Status: Abnormal   Collection Time: 11/28/14  5:25 AM  Result Value Ref Range   Sodium 136 135 - 145 mmol/L   Potassium 4.0 3.5 - 5.1 mmol/L   Chloride 108 101 - 111 mmol/L   CO2 22 22 - 32 mmol/L   Glucose, Bld 133 (H) 65 - 99 mg/dL   BUN <5 (L) 6 - 20  mg/dL   Creatinine, Ser 0.60 0.44 - 1.00 mg/dL   Calcium 8.1 (L) 8.9 - 10.3 mg/dL   GFR calc non Af Amer >60 >60 mL/min   GFR calc Af Amer >60 >60 mL/min    Comment: (NOTE) The eGFR has been calculated using the CKD EPI equation. This calculation has not been validated in all clinical situations. eGFR's persistently <60 mL/min signify possible Chronic Kidney Disease.    Anion gap 6 5 - 15    Comment: Performed at [facility]  Vitals: Blood pressure 113/60, pulse 57, temperature 98.7 F (37.1 C), temperature source Oral, resp. rate 18, SpO2 100 %.  Risk to Self: Is patient at risk for suicide?: No Risk to Others:   Prior Inpatient Therapy:   Prior Outpatient Therapy:    Current Facility-Administered Medications  Medication Dose Route Frequency Provider Last Rate Last Dose  . 0.9 %  sodium chloride infusion   Intravenous Continuous Jonnie Kind, MD   Stopped at 11/28/14 0700  . HYDROmorphone (DILAUDID) 1 MG/ML injection           . HYDROmorphone (DILAUDID) injection 1 mg  1 mg Intravenous Q3H PRN Jonnie Kind, MD      . ibuprofen (ADVIL,MOTRIN) tablet 600 mg  600 mg Oral Q6H PRN Jonnie Kind, MD   600 mg at 11/28/14 0558  . ketorolac (TORADOL) 30 MG/ML injection 30 mg  30 mg Intravenous Q6H Jonnie Kind, MD   30 mg at 11/28/14 9509   Or  . ketorolac (TORADOL) 30 MG/ML injection 30 mg  30 mg Intramuscular Q6H Jonnie Kind, MD      . ketorolac (TORADOL) 30 MG/ML injection           . lurasidone (LATUDA) tablet 40 mg  40 mg Oral Q breakfast Jonnie Kind, MD      . ondansetron [facility]) tablet 4 mg  4 mg Oral Q6H PRN Jonnie Kind, MD       Or  . ondansetron [facility]) injection 4 mg  4 mg Intravenous Q6H PRN Jonnie Kind, MD      . oxyCODONE-acetaminophen (PERCOCET/ROXICET) 5-325 MG per tablet 1-2 tablet  1-2 tablet Oral Q4H PRN Jonnie Kind, MD   1 tablet at 11/28/14 [phone]  . pantoprazole (PROTONIX) EC tablet 40 mg  40 mg Oral Daily Jonnie Kind,  MD        Musculoskeletal: Strength & Muscle Tone: within normal limits Gait & Station: normal Patient leans: N/A  Psychiatric Specialty Exam: Physical Exam  Psychiatric: Her affect is angry and labile. Her speech is tangential. She is agitated, aggressive and actively hallucinating. Thought content is paranoid and delusional. Cognition and memory are normal. She expresses impulsivity.    Review of Systems  Constitutional: Negative.   HENT: Negative.   Eyes: Negative.   Respiratory: Negative.   Cardiovascular: Negative.   Gastrointestinal: Negative.   Genitourinary: Negative.   Musculoskeletal: Negative.   Skin: Negative.   Neurological: Negative.   Endo/Heme/Allergies: Negative.   Psychiatric/Behavioral: Positive for hallucinations. The patient is nervous/anxious and has insomnia.   All other systems reviewed and are negative.   Blood pressure 113/60, pulse 57, temperature 98.7 F (37.1 C), temperature source Oral, resp. rate 18, SpO2 100 %.There is no weight on file to calculate BMI.  General Appearance: Disheveled  Eye Contact::  Good  Speech:  Pressured  Volume:  Increased  Mood:  Angry and Irritable  Affect:  Labile  Thought Process:  Disorganized  Orientation:  Full (Time, Place, and Person)  Thought Content:  Delusions and Paranoid Ideation  Suicidal Thoughts:  No  Homicidal Thoughts:  No  Memory:  Immediate;   Fair Recent;   Fair Remote;   Fair  Judgement:  Impaired  Insight:  Lacking  Psychomotor Activity:  Increased  Concentration:  Fair  Recall:  [employer] of Knowledge:Fair  Language: Good  Akathisia:  No  Handed:  Right  AIMS (if indicated):     Assets:  Communication Skills  ADL's:  Intact  Cognition: WNL  Sleep:   poor   Medical Decision Making: Review or order clinical lab tests (1), Decision to obtain old records (1), Review of Medication Regimen & Side Effects (2) and Review of New Medication or Change in Dosage (2)  Treatment Plan  Summary: Schizophrenia, paranoid, unstable,  and cooperative noncompliant with the treatment recommendations and patient warrants inpatient admission  Daily contact with patient to assess and evaluate symptoms and progress in treatment and Medication management   Medications: Psychosis: Start risperidone 1 mg at bedtime because she complains about stomach upset with previous medication Latuda 42m PO daily for psychosis Patient continued to be psychotic, paranoid, delusional and noncompliant with medication management at this time. Patient has no clinical improvement siadmission secondary to poorly compliant with medication management.Patient meets criteria for acute psychiatric hospitalization for crisis stabilization, safety monitoring on medication management. [facility]   Elois Averitt,JANARDHAHA R. 11/28/2014 12:39 PM

## 2014-11-28 NOTE — Progress Notes (Signed)
Involuntary Commitment status continued.  IVC paperwork delivered by [facility]/Behavioral Health Disposition [name] and placed in patient's chart.  Ms. Holly Hines is working on placement for patient at her time of discharge from [facility] and states she will follow up with [name] tomorrow morning.  [name] spoke with bedside RN and informed that since patient is involuntarily committed to this facility, the police should be contacted by calling 911 should she attempt to leave.  [name] will continue to follow.

## 2014-11-28 NOTE — Anesthesia Postprocedure Evaluation (Signed)
  Anesthesia Post-op Note  Patient: Holly Hines  Procedure(s) Performed: Procedure(s): EXPLORATORY LAPAROTOMY, Right Salpingectomy with Removal Ectopic Pregnancy (N/A)  Patient Location: Women's Unit  Anesthesia Type:General  Level of Consciousness: awake, alert , oriented and patient cooperative  Airway and Oxygen Therapy: Patient Spontanous Breathing  Post-op Pain: none  Post-op Assessment: Post-op Vital signs reviewed, Patient's Cardiovascular Status Stable, Respiratory Function Stable, Patent Airway and No signs of Nausea or vomiting              Post-op Vital Signs: Reviewed and stable  Last Vitals:  Filed Vitals:   11/28/14 0604  BP: 123/68  Pulse: 60  Temp: 36.7 C  Resp: 16    Complications: No apparent anesthesia complications

## 2014-11-28 NOTE — Progress Notes (Signed)
Patient's original IVC paperwork was placed on file at [facility].  [place], [place] Disposition staff 11/28/2014 3:43 PM

## 2014-11-28 NOTE — Progress Notes (Signed)
1 Day Post-Op Procedure(s) (LRB): EXPLORATORY LAPAROTOMY, Right Salpingectomy with Removal Ectopic Pregnancy (N/A)  Subjective: Patient reports remaining dissatisfied with hospitals.delusional, in that she wants the tubes of blood drawn at admit to be sent to [facility] for donation. Gently declined. Pt has cooperated with care, allows IV antibiotics but no other IV meds..    Objective: I have reviewed patient's vital signs, medications and labs. CBC Latest Ref Rng 11/28/2014 11/27/2014 11/21/2014  WBC 4.0 - 10.5 K/uL 4.9 7.6 5.7  Hemoglobin 12.0 - 15.0 g/dL 1.6(X) 10.9(L) 11.0(L)  Hematocrit 36.0 - 46.0 % 27.3(L) 31.5(L) 32.9(L)  Platelets 150 - 400 K/uL 253 289 350     General: alert, cooperative, appears stated age and uncooperative GI: normal findings: soft, non-tender and incision: clean, dry and slight drainage on right 1/4 of dressing Extremities: Homans sign is negative, no sign of DVT Vaginal Bleeding: none  Assessment: s/p Procedure(s): EXPLORATORY LAPAROTOMY, Right Salpingectomy with Removal Ectopic Pregnancy (N/A): stable and progressing well  Plan: Advance diet Discontinue IV fluids  LOS: 1 day    Evonna Stoltz V 11/28/2014, 9:45 AM

## 2014-11-28 NOTE — Progress Notes (Signed)
Patient refused to be checked for assessment.  Denies pain.  Only word patient spoke was "no" to any converation.  Patient does not seem to be uncomfortable or in pain.  Encouraged patient to verbalize needs thru sitter.

## 2014-11-29 ENCOUNTER — Inpatient Hospital Stay (HOSPITAL_COMMUNITY)
Admission: AD | Admit: 2014-11-29 | Discharge: 2014-12-05 | DRG: 885 | Disposition: A | Discharge status: Home / Self Care | Payer: Federal, State, Local not specified - Other | Attending: Psychiatry | Admitting: Psychiatry

## 2014-11-29 ENCOUNTER — Encounter (HOSPITAL_COMMUNITY): Payer: Self-pay | Admitting: *Deleted

## 2014-11-29 DIAGNOSIS — Z79899 Other long term (current) drug therapy: Secondary | ICD-10-CM

## 2014-11-29 DIAGNOSIS — F17210 Nicotine dependence, cigarettes, uncomplicated: Secondary | ICD-10-CM | POA: Diagnosis present

## 2014-11-29 DIAGNOSIS — O009 Ectopic pregnancy, unspecified: Secondary | ICD-10-CM | POA: Diagnosis present

## 2014-11-29 DIAGNOSIS — Z9114 Patient's other noncompliance with medication regimen: Secondary | ICD-10-CM | POA: Diagnosis present

## 2014-11-29 DIAGNOSIS — F200 Paranoid schizophrenia: Principal | ICD-10-CM | POA: Diagnosis present

## 2014-11-29 DIAGNOSIS — Z79891 Long term (current) use of opiate analgesic: Secondary | ICD-10-CM

## 2014-11-29 DIAGNOSIS — F22 Delusional disorders: Secondary | ICD-10-CM | POA: Diagnosis present

## 2014-11-29 DIAGNOSIS — I10 Essential (primary) hypertension: Secondary | ICD-10-CM | POA: Diagnosis present

## 2014-11-29 MED ORDER — MAGNESIUM HYDROXIDE 400 MG/5ML PO SUSP: 30.0000 mL | Freq: Every day | ORAL | Status: DC | PRN

## 2014-11-29 MED ORDER — HALOPERIDOL 5 MG PO TABS
5.0000 mg | ORAL_TABLET | Freq: Four times a day (QID) | ORAL | Status: DC | PRN
  Administered 2014-11-30 – 2014-12-02 (×3): 5 mg via ORAL
  Filled 2014-11-29 (×3): qty 1

## 2014-11-29 MED ORDER — HALOPERIDOL LACTATE 5 MG/ML IJ SOLN: 5.0000 mg | Freq: Four times a day (QID) | INTRAMUSCULAR | Status: DC | PRN

## 2014-11-29 MED ORDER — LORAZEPAM 2 MG/ML IJ SOLN: 2.0000 mg | Freq: Four times a day (QID) | INTRAMUSCULAR | Status: DC | PRN

## 2014-11-29 MED ORDER — LORAZEPAM 1 MG PO TABS
2.0000 mg | ORAL_TABLET | Freq: Four times a day (QID) | ORAL | Status: DC | PRN
  Administered 2014-11-30 – 2014-12-02 (×4): 2 mg via ORAL
  Filled 2014-11-29 (×4): qty 2

## 2014-11-29 MED ORDER — RISPERIDONE 1 MG PO TABS
1.0000 mg | ORAL_TABLET | Freq: Every day | ORAL | Status: DC
  Administered 2014-11-29 – 2014-12-01 (×3): 1 mg via ORAL
  Filled 2014-11-29 (×5): qty 1

## 2014-11-29 MED ORDER — POLYETHYLENE GLYCOL 3350 17 GM/SCOOP PO POWD
17.0000 g | Freq: Two times a day (BID) | ORAL | Status: DC
  Filled 2014-11-29: qty 255

## 2014-11-29 MED ORDER — LORAZEPAM 2 MG/ML IJ SOLN
INTRAMUSCULAR | Status: AC
Start: 2014-11-29 — End: 2014-11-29
  Filled 2014-11-29: qty 1

## 2014-11-29 MED ORDER — ALUM & MAG HYDROXIDE-SIMETH 200-200-20 MG/5ML PO SUSP: 30.0000 mL | ORAL | Status: DC | PRN

## 2014-11-29 MED ORDER — LORAZEPAM 2 MG/ML IJ SOLN
2.0000 mg | Freq: Once | INTRAMUSCULAR | Status: AC
  Administered 2014-11-29: 2 mg via INTRAMUSCULAR

## 2014-11-29 MED ORDER — ACETAMINOPHEN 325 MG PO TABS
650.0000 mg | ORAL_TABLET | Freq: Four times a day (QID) | ORAL | Status: DC | PRN
  Administered 2014-12-02: 650 mg via ORAL
  Filled 2014-11-29: qty 2

## 2014-11-29 MED ORDER — IBUPROFEN 600 MG PO TABS
600.0000 mg | ORAL_TABLET | Freq: Four times a day (QID) | ORAL | Status: DC | PRN
  Administered 2014-11-29 – 2014-12-01 (×3): 600 mg via ORAL
  Filled 2014-11-29 (×3): qty 1

## 2014-11-29 MED ORDER — RISPERIDONE 1 MG PO TABS: 1.0000 mg | ORAL_TABLET | Freq: Every day | ORAL | Status: DC

## 2014-11-29 MED ORDER — HALOPERIDOL LACTATE 5 MG/ML IJ SOLN
10.0000 mg | Freq: Once | INTRAMUSCULAR | Status: AC
  Administered 2014-11-29: 10 mg via INTRAMUSCULAR
  Filled 2014-11-29: qty 2

## 2014-11-29 MED ORDER — OXYCODONE-ACETAMINOPHEN 5-325 MG PO TABS: 1.0000 | ORAL_TABLET | ORAL | Status: DC | PRN

## 2014-11-29 MED ORDER — IBUPROFEN 600 MG PO TABS: 600.0000 mg | ORAL_TABLET | Freq: Four times a day (QID) | ORAL | Status: DC | PRN

## 2014-11-29 MED ORDER — HALOPERIDOL LACTATE 5 MG/ML IJ SOLN
5.0000 mg | Freq: Once | INTRAMUSCULAR | Status: DC
  Filled 2014-11-29: qty 1

## 2014-11-29 MED ORDER — OXYCODONE-ACETAMINOPHEN 5-325 MG PO TABS
1.0000 | ORAL_TABLET | ORAL | Status: DC | PRN
  Administered 2014-11-30 – 2014-12-01 (×2): 1 via ORAL
  Filled 2014-11-29 (×2): qty 1

## 2014-11-29 MED ORDER — DIPHENHYDRAMINE HCL 50 MG/ML IJ SOLN
50.0000 mg | Freq: Once | INTRAMUSCULAR | Status: AC
  Administered 2014-11-29: 50 mg via INTRAMUSCULAR
  Filled 2014-11-29: qty 1

## 2014-11-29 NOTE — Tx Team (Addendum)
Initial Interdisciplinary Treatment Plan   PATIENT STRESSORS: Financial difficulties Marital or family conflict Occupational concerns   PATIENT STRENGTHS: Physical Health  Communication skills   PROBLEM LIST: Problem List/Patient Goals Date to be addressed Date deferred Reason deferred Estimated date of resolution  psychosis 11/29/2014     Alteration in mood 11/29/2014     "get out of here"                                           DISCHARGE CRITERIA:  Ability to meet basic life and health needs Adequate post-discharge living arrangements Improved stabilization in mood, thinking, and/or behavior Need for constant or close observation no longer present  PRELIMINARY DISCHARGE PLAN: Attend aftercare/continuing care group Outpatient therapy Placement in alternative living arrangements  PATIENT/FAMIILY INVOLVEMENT: This treatment plan has been presented to and reviewed with the patient, Holly Hines, and/or family member, .  The patient and family have been given the opportunity to ask questions and make suggestions.  Harvel Quale 11/29/2014, 3:09 PM

## 2014-11-29 NOTE — Progress Notes (Signed)
Patient pulled honeycomb dressing off.  Incision with steri-strips, dry drainage on the right side.  Patient refused any medication.  Yelled, " get out of my room and take all the sheriff with you."

## 2014-11-29 NOTE — H&P (Signed)
Psychiatric Admission Assessment Adult  Patient Identification: Holly Hines MRN:  [id number] Date of Evaluation:  11/29/2014 Chief Complaint:  SCHIZOPHRENIA,PARANOID Principal Diagnosis: Paranoid schizophrenia Diagnosis:   Patient Active Problem List   Diagnosis Date Noted  . Schizophrenia, paranoid type [F20.0] 11/29/2014  . Hemoperitoneum due to rupture of right tubal ectopic pregnancy [O00.9, K66.1] 11/28/2014  . tubal ectopic pregnancy unruptured, 12 wks [O00.1]   . Acute psychosis [F29]   . Paranoid schizophrenia [F20.0]   . Hypertension in pregnancy, antepartum [O16.9] 10/23/2012  . Schizophrenia, paranoid [F20.0] 10/10/2011   History of Present Illness: Chart was reviewed for history, since pt currently sedated from prn meds given this AM. Holly Hines is a 44 y.o. female seen for evaluation for bizarre behavior and delusional thinking. patient stated that she is living in a boarding house with a roommate who was never been in contact with patient since she was placed in the hospital about 5-6 days ago. Patient complains he does need four dollars to buy her medication from the [facility] which might be reason she's been non-compliant with medication management Patient is also not trying to communicate with anybody outside the hospital. Patient reported that she was initially admitted to [facility] long emergency department with the involuntary commitment from [facility] for dangerous and destructive/bizarre behaviors. As per the records patient was found lying in the road several times in this week, refusing to eat and drink and noncompliant with medication management. Patient was found pregnant urine hospital routine testing and then found ectopic pregnancy by ultrasonogram. Patient was admitted to the [facility] where she had surgery yesterday. Patient is currently in postoperative surgical care which probably needed at least 48 hours. Patient is poorly  communicative, poor historian and poorly cooperative with the hospital treatment services. patient was known to this provider from this weekend rounds and the [facility] long emergency department where she was found with a significant irritability, agitated, bizarre, and hostile, shouting and refusing to answer questions. patient is also demanding to be discharged from the hospital without appropriate care.  Patient is labile, easily agitated and uncooperative. Patient continues to present as delusional and psychotic. Patient does not endorse suicidal/homicidal ideation, but is clearly psychotic, paranoid, and delusion, and continues to warrant inpatient admission. We'll continue her involuntary commitment petition.  Upon arriving to [facility] this AM, pt became physically aggressive with staff and demanding to be discharged, so she required prn medications (haldol, ativan, benadryl) and 4-point restraints for safety. Pt has been released from restraints, and is sleeping in her bed now with sitter at bedside.  Elements:  Location:  psychosis. Quality:  severe. Severity:  severe. Timing:  unknown. Duration:  unknonw. Context:  psychosis. Associated Signs/Symptoms: Depression Symptoms:  psychomotor agitation, (Hypo) Manic Symptoms:  Irritable Mood, Labiality of Mood, Anxiety Symptoms:  none Psychotic Symptoms:  Delusions, Paranoia, PTSD Symptoms: Negative Total Time spent with patient: 45 minutes  Past Medical History:  Past Medical History  Diagnosis Date  . Mental disorder   . Pregnant   . Hypertension     Past Surgical History  Procedure Laterality Date  . Cesarean section  9 yrs ago    Family History: No family history on file. Social History:  History  Alcohol Use: Not on file     History  Drug Use Not on file    History   Social History  . Marital Status: Single    Spouse Name: N/A  . Number of Children: N/A  . Years of Education:  N/A   Social History Main Topics  .  Smoking status: Current Some Day Smoker  . Smokeless tobacco: Not on file  . Alcohol Use: Not on file  . Drug Use: Not on file  . Sexual Activity: Yes    Birth Control/ Protection: None   Other Topics Concern  . Not on file   Social History Narrative   Additional Social History:                          Musculoskeletal: Strength & Muscle Tone: within normal limits Gait & Station: sleeping in bed Patient leans: N/A  Psychiatric Specialty Exam: Physical Exam  ROS  Blood pressure 117/59, pulse 73, resp. rate 20.There is no weight on file to calculate BMI.  General Appearance: Disheveled  Eye Sport and exercise psychologist::  None  Speech:  Negative  Volume:  sleeping  Mood:  Irritable  Affect:  Labile  Thought Process:  Disorganized  Orientation:  Negative  Thought Content:  Delusions and Paranoid Ideation  Suicidal Thoughts:  No  Homicidal Thoughts:  aggressive towards staff  Memory:  Negative  Judgement:  Poor  Insight:  Shallow  Psychomotor Activity:  Increased  Concentration:  Poor  Recall:  Poor  Fund of Knowledge:Poor  Language: Poor  Akathisia:  Negative  Handed:  Right  AIMS (if indicated):     Assets:  Physical Health  ADL's:  Impaired  Cognition: Impaired,  Mild  Sleep:      Risk to Self:   Risk to Others:   Prior Inpatient Therapy:   Prior Outpatient Therapy:    Alcohol Screening:    Allergies:   Allergies  Allergen Reactions  . Pollen Extract Other (See Comments)    unknown   Lab Results:  Results for orders placed or performed during the hospital encounter of 11/27/14 (from the past 48 hour(s))  Prepare RBC     Status: None   Collection Time: 11/27/14  8:59 PM  Result Value Ref Range   Order Confirmation ORDER PROCESSED BY BLOOD BANK   CBC     Status: Abnormal   Collection Time: 11/27/14  9:15 PM  Result Value Ref Range   WBC 7.6 4.0 - 10.5 K/uL   RBC 3.58 (L) 3.87 - 5.11 MIL/uL   Hemoglobin 10.9 (L) 12.0 - 15.0 g/dL   HCT 31.5 (L) 36.0 - 46.0  %   MCV 88.0 78.0 - 100.0 fL   MCH 30.4 26.0 - 34.0 pg   MCHC 34.6 30.0 - 36.0 g/dL   RDW 13.7 11.5 - 15.5 %   Platelets 289 150 - 400 K/uL  Type and screen     Status: None (Preliminary result)   Collection Time: 11/27/14  9:15 PM  Result Value Ref Range   ABO/RH(D) O POS    Antibody Screen NEG    Sample Expiration 11/30/2014    Unit Number [id number]    Blood Component Type RBC LR PHER2    Unit division 00    Status of Unit ALLOCATED    Transfusion Status OK TO TRANSFUSE    Crossmatch Result Compatible    Unit Number [id number]    Blood Component Type RED CELLS,LR    Unit division 00    Status of Unit ALLOCATED    Transfusion Status OK TO TRANSFUSE    Crossmatch Result Compatible   CBC     Status: Abnormal   Collection Time: 11/28/14  5:25 AM  Result Value Ref Range  WBC 4.9 4.0 - 10.5 K/uL   RBC 3.07 (L) 3.87 - 5.11 MIL/uL   Hemoglobin 9.2 (L) 12.0 - 15.0 g/dL   HCT 27.3 (L) 36.0 - 46.0 %   MCV 88.9 78.0 - 100.0 fL   MCH 30.0 26.0 - 34.0 pg   MCHC 33.7 30.0 - 36.0 g/dL   RDW 14.0 11.5 - 15.5 %   Platelets 253 150 - 400 K/uL  Basic metabolic panel     Status: Abnormal   Collection Time: 11/28/14  5:25 AM  Result Value Ref Range   Sodium 136 135 - 145 mmol/L   Potassium 4.0 3.5 - 5.1 mmol/L   Chloride 108 101 - 111 mmol/L   CO2 22 22 - 32 mmol/L   Glucose, Bld 133 (H) 65 - 99 mg/dL   BUN <5 (L) 6 - 20 mg/dL   Creatinine, Ser 0.60 0.44 - 1.00 mg/dL   Calcium 8.1 (L) 8.9 - 10.3 mg/dL   GFR calc non Af Amer >60 >60 mL/min   GFR calc Af Amer >60 >60 mL/min    Comment: (NOTE) The eGFR has been calculated using the CKD EPI equation. This calculation has not been validated in all clinical situations. eGFR's persistently <60 mL/min signify possible Chronic Kidney Disease.    Anion gap 6 5 - 15    Comment: Performed at [facility]   Current Medications: Current Facility-Administered Medications  Medication Dose Route Frequency Provider Last Rate Last  Dose  . haloperidol lactate (HALDOL) injection 5 mg  5 mg Intramuscular Q6H PRN Skip Estimable, MD       Or  . haloperidol (HALDOL) tablet 5 mg  5 mg Oral Q6H PRN Antonae Zbikowski Rene Kocher, MD      . LORazepam (ATIVAN) tablet 2 mg  2 mg Oral Q6H PRN Skip Estimable, MD       Or  . LORazepam (ATIVAN) injection 2 mg  2 mg Intramuscular Q6H PRN Skip Estimable, MD       PTA Medications: Prescriptions prior to admission  Medication Sig Dispense Refill Last Dose  . ibuprofen (ADVIL,MOTRIN) 600 MG tablet Take 1 tablet (600 mg total) by mouth every 6 (six) hours as needed (mild pain). 30 tablet 0 11/28/2014 at Unknown time  . oxyCODONE-acetaminophen (PERCOCET/ROXICET) 5-325 MG per tablet Take 1-2 tablets by mouth every 4 (four) hours as needed for severe pain (moderate to severe pain (when tolerating fluids)). 30 tablet 0 Unknown at Unknown time  . polyethylene glycol powder (GLYCOLAX/MIRALAX) powder Take 17 g by mouth 2 (two) times daily. Until daily soft stools  OTC (Patient not taking: Reported on 07/01/2014) 255 g 0 Unknown at Unknown time  . risperiDONE (RISPERDAL) 1 MG tablet Take 1 tablet (1 mg total) by mouth at bedtime.   Unknown at Unknown time    Previous Psychotropic Medications: Yes   Substance Abuse History in the last 12 months:  No.    Consequences of Substance Abuse: Negative  Results for orders placed or performed during the hospital encounter of 11/27/14 (from the past 72 hour(s))  Prepare RBC     Status: None   Collection Time: 11/27/14  8:59 PM  Result Value Ref Range   Order Confirmation ORDER PROCESSED BY BLOOD BANK   CBC     Status: Abnormal   Collection Time: 11/27/14  9:15 PM  Result Value Ref Range   WBC 7.6 4.0 - 10.5 K/uL   RBC 3.58 (L) 3.87 - 5.11 MIL/uL  Hemoglobin 10.9 (L) 12.0 - 15.0 g/dL   HCT 31.5 (L) 36.0 - 46.0 %   MCV 88.0 78.0 - 100.0 fL   MCH 30.4 26.0 - 34.0 pg   MCHC 34.6 30.0 - 36.0 g/dL   RDW 13.7 11.5 - 15.5 %   Platelets 289 150 - 400 K/uL  Type  and screen     Status: None (Preliminary result)   Collection Time: 11/27/14  9:15 PM  Result Value Ref Range   ABO/RH(D) O POS    Antibody Screen NEG    Sample Expiration 11/30/2014    Unit Number [id number]    Blood Component Type RBC LR PHER2    Unit division 00    Status of Unit ALLOCATED    Transfusion Status OK TO TRANSFUSE    Crossmatch Result Compatible    Unit Number [id number]    Blood Component Type RED CELLS,LR    Unit division 00    Status of Unit ALLOCATED    Transfusion Status OK TO TRANSFUSE    Crossmatch Result Compatible   CBC     Status: Abnormal   Collection Time: 11/28/14  5:25 AM  Result Value Ref Range   WBC 4.9 4.0 - 10.5 K/uL   RBC 3.07 (L) 3.87 - 5.11 MIL/uL   Hemoglobin 9.2 (L) 12.0 - 15.0 g/dL   HCT 27.3 (L) 36.0 - 46.0 %   MCV 88.9 78.0 - 100.0 fL   MCH 30.0 26.0 - 34.0 pg   MCHC 33.7 30.0 - 36.0 g/dL   RDW 14.0 11.5 - 15.5 %   Platelets 253 150 - 400 K/uL  Basic metabolic panel     Status: Abnormal   Collection Time: 11/28/14  5:25 AM  Result Value Ref Range   Sodium 136 135 - 145 mmol/L   Potassium 4.0 3.5 - 5.1 mmol/L   Chloride 108 101 - 111 mmol/L   CO2 22 22 - 32 mmol/L   Glucose, Bld 133 (H) 65 - 99 mg/dL   BUN <5 (L) 6 - 20 mg/dL   Creatinine, Ser 0.60 0.44 - 1.00 mg/dL   Calcium 8.1 (L) 8.9 - 10.3 mg/dL   GFR calc non Af Amer >60 >60 mL/min   GFR calc Af Amer >60 >60 mL/min    Comment: (NOTE) The eGFR has been calculated using the CKD EPI equation. This calculation has not been validated in all clinical situations. eGFR's persistently <60 mL/min signify possible Chronic Kidney Disease.    Anion gap 6 5 - 15    Comment: Performed at [facility]    Observation Level/Precautions:  1 to 1  Laboratory:  see above  Psychotherapy:    Medications:    Consultations:    Discharge Concerns:    Estimated LOS:  Other:     Psychological Evaluations: No   Treatment Plan Summary: Daily contact with patient to assess  and evaluate symptoms and progress in treatment, Medication management and Plan will restart risperdal 1 mg qhs for psychosis. will obtain collateral info from family. will order percocet prn for pain, since pt is post-op.  Medical Decision Making:  New problem, with additional work up planned, Decision to obtain old records (1) and Review and summation of old records (2)  I certify that inpatient services furnished can reasonably be expected to improve the patient's condition.   [place], Loleta Dicker 8/5/20165:48 PM

## 2014-11-29 NOTE — Progress Notes (Signed)
Pt dressed ready for transport.  Law enforcement arrived, transfer paper work was given, they handcuffed her, pt.  questioned why she needed to be handcuffed, she said she came in an ambulance, she should leave in an ambulance.  I assured her she did not need an ambulance any longer, the officer explained it was their policy to use the handcuffs.  He was kind and the pt complied, she ambulated to the elevator with them without complaint. Susie Cassette RN

## 2014-11-29 NOTE — Progress Notes (Addendum)
1035 pt to search room for admission process, during which a show of support was called. The patient transported via [employer]. Patient refusing to cooperate, refusing to allow staff to perform search. Attempted to redirect patient verbally but she continued to escalate, began yelling and screaming at staff that '' I'm to be discharged, I've already seen the psychiatrist.!  '' [place] Inetta Fermo present and Buyer, retail present as well.  1040 Patient refused staff to look at surgical wound, and notified Dr. Tawni Carnes of above information. Patient behaviors escalating and refusing to cooperate with admission process. Orders received for haldol  IM STAT, Benadryl  IM STAT, ativan  IM STAT. Restraint chair obtained in order to transport pt safely on the unit. Pt continued to refuse medications. Manual hold placed at 1057 to give emergent medications and placed patient in restraint chair. Haldol  IM, Ativan  IM and benadryl  IM given to right ventrogluteal, and right deltoid respectively. Pt tolerated without issue. Pt secured with restraint chair at 1101 and transported to the unit.  1103 Pt vital signs (see Flowsheet) obtained and patient on unit, taken to quiet room with 1.1. Staff monitoring.  1105 Pt refused to sign ROI to notify family  1113 Pt sitting comfortably in chair, RR even and unlabored. Writer asked pt how she was feeling and she answered fine. Refused to have any food or fluids at this time.  1118 Released from restraint chair. No further restraint on patient at this time. Effective response noted to  medications given. Pt escorted with 1.1. And nursing staff to her room. Vitals obtained.  1132 pt remains in no acute distress, sleeping in room. Skin search performed- noted surgical incision to lower abdomen as previously reported. No bleeding no redness or swelling noted. 1147 pt remains 1.1 , is resting quietly in no acute distress, RR even and unlabored. Pt is safe.

## 2014-11-29 NOTE — [facility] Group Notes (Signed)
[facility] LCSW Group Therapy  11/29/2014  1:05 PM  Type of Therapy:  Group therapy  Participation Level:  Active  Participation Quality:  Attentive  Affect:  Flat  Cognitive:  Oriented  Insight:  Limited  Engagement in Therapy:  Limited  Modes of Intervention:  Discussion, Socialization  Summary of Progress/Problems:  Chaplain was here to lead a group on themes of hope and courage.  Invited.  Was in bed asleep.  Daryel Gerald B 11/29/2014 1:34 PM

## 2014-11-29 NOTE — Discharge Instructions (Signed)
Ectopic Pregnancy °An ectopic pregnancy happens when a fertilized egg grows outside the uterus. A pregnancy cannot live outside of the uterus. This problem often happens in the fallopian tube. It is often caused by damage to the fallopian tube. °If this problem is found early, you may be treated with medicine. If your tube tears or bursts open (ruptures), you will bleed inside. This is an emergency. You will need surgery. Get help right away.  °SYMPTOMS °You may have normal pregnancy symptoms at first. These include: °· Missing your period. °· Feeling sick to your stomach (nauseous). °· Being tired. °· Having tender breasts. °Then, you may start to have symptoms that are not normal. These include: °· Pain with sex (intercourse). °· Bleeding from the vagina. This includes light bleeding (spotting). °· Belly (abdomen) or lower belly cramping or pain. This may be felt on one side. °· A fast heartbeat (pulse). °· Passing out (fainting) after going poop (bowel movement). °If your tube tears, you may have symptoms such as: °· Really bad pain in the belly or lower belly. This happens suddenly. °· Dizziness. °· Passing out. °· Shoulder pain. °GET HELP RIGHT AWAY IF:  °You have any of these symptoms. This is an emergency. °MAKE SURE YOU: °· Understand these instructions. °· Will watch your condition. °· Will get help right away if you are not doing well or get worse. °Document Released: 07/09/2008 Document Revised: 04/17/2013 Document Reviewed: 11/22/2012 °ExitCare® Patient Information ©2015 [employer]. This information is not intended to replace advice given to you by your health care provider. Make sure you discuss any questions you have with your health care provider. ° °

## 2014-11-29 NOTE — Progress Notes (Signed)
0730 Nursing:  Pt resting in room, Sitter at bedside, environment secured.  I requested to assess pt, take vitals.  She states at this time, she doesn't want me to touch her.  She doesn't need anything, and would like her discharge papers.  Cristie Hem RN

## 2014-11-29 NOTE — [facility] Suicide Risk Assessment (Signed)
[facility] Admission Suicide Risk Assessment   Nursing information obtained from:  Review of record Demographic factors:  Low socioeconomic status, Unemployed Current Mental Status:  NA Loss Factors:  Financial problems / change in socioeconomic status Historical Factors:  Family history of mental illness or substance abuse, Impulsivity Risk Reduction Factors:    Total Time spent with patient: 45 minutes Principal Problem: Paranoid schizophrenia Diagnosis:   Patient Active Problem List   Diagnosis Date Noted  . Schizophrenia, paranoid type [F20.0] 11/29/2014  . Hemoperitoneum due to rupture of right tubal ectopic pregnancy [O00.9, K66.1] 11/28/2014  . tubal ectopic pregnancy unruptured, 12 wks [O00.1]   . Acute psychosis [F29]   . Paranoid schizophrenia [F20.0]   . Hypertension in pregnancy, antepartum [O16.9] 10/23/2012  . Schizophrenia, paranoid [F20.0] 10/10/2011     Continued Clinical Symptoms:    The "Alcohol Use Disorders Identification Test", Guidelines for Use in Primary Care, Second Edition.  World [employer] writer [employer] [employer] [facility]). Score between 0-7:  no or low risk or alcohol related problems. Score between 8-15:  moderate risk of alcohol related problems. Score between 16-19:  high risk of alcohol related problems. Score 20 or above:  warrants further diagnostic evaluation for alcohol dependence and treatment.   CLINICAL FACTORS:   Schizophrenia:   Paranoid or undifferentiated type   Musculoskeletal: Strength & Muscle Tone: within normal limits Gait & Station: unable to stand Patient leans: N/A  Psychiatric Specialty Exam: Physical Exam  ROS  Blood pressure 117/59, pulse 73, resp. rate 20.There is no weight on file to calculate BMI.  General Appearance: Disheveled and Guarded  Eye Contact::  None  Speech:  Negative  Volume:  Increased  Mood:  Irritable  Affect:  Labile  Thought Process:  Disorganized  Orientation:  Negative  Thought Content:  Delusions and Paranoid  Ideation  Suicidal Thoughts:  No  Homicidal Thoughts:  aggressive towards staff  Memory:  Negative  Judgement:  Poor  Insight:  Shallow  Psychomotor Activity:  Increased  Concentration:  Poor  Recall:  Poor  Fund of Knowledge:Poor  Language: Poor  Akathisia:  Negative  Handed:  Right  AIMS (if indicated):     Assets:  Social Support  Sleep:     Cognition: Impaired,  Mild  ADL's:  Intact     COGNITIVE FEATURES THAT CONTRIBUTE TO RISK:  Loss of executive function    SUICIDE RISK:   Mild:  Suicidal ideation of limited frequency, intensity, duration, and specificity.  There are no identifiable plans, no associated intent, mild dysphoria and related symptoms, good self-control (both objective and subjective assessment), few other risk factors, and identifiable protective factors, including available and accessible social support.  PLAN OF CARE: will admit to [facility] for safety/stabilization. Will restart risperdal 1 mg daily. Will obtain collateral info from family.  Medical Decision Making:  New problem, with additional work up planned, Decision to obtain old records (1) and Review and summation of old records (2)  I certify that inpatient services furnished can reasonably be expected to improve the patient's condition.   Ancil Linsey 11/29/2014, 6:02 PM

## 2014-11-29 NOTE — Progress Notes (Signed)
Per Inetta Fermo, [facility], pt is accepted to [facility] bed 503-1 by Dr. Elna Breslow. Pt under IVC and will be transported by [employer]. Nurse report #: [phone].  Spoke with [name] re: pt's placement. (number for arranging transport: [phone], option 1, option 3). Writer available to assist as needed. (ex: [phone])  Ilean Skill, MSW, LCSW Clinical Social Work, Disposition  11/29/2014 [phone]

## 2014-11-29 NOTE — Progress Notes (Signed)
Patient did not attend group tonight. She remained in bed asleep, on a 1:1

## 2014-11-29 NOTE — Progress Notes (Signed)
[name] received report from M. Stout/Disposition [name] stating patient will transfer to [facility] today.  [name] informed Ms. Valentina Lucks that patient has been medically cleared for transfer from this facility as of this morning.  Bed ready at [facility].  [name] informed patient's bedside RN of disposition plan and contacted [facility] non-emergency line to request transport since patient is involuntarily committed.  An officer will be on the way to [facility] shortly.

## 2014-11-29 NOTE — Progress Notes (Signed)
Patient noticed by sitter to be picking her incision.  Was told not to and patient yelled, " get out of my room" and started throwing tissues and the honeycomb dressing  held in her hand to the sitter. Dr. Adrian Blackwater, updated via phone.  Haldol IM ordered but patient refused promising this nurse , " I will try to sleep, I don't needs metal in my body.'   0300 Quiet at this time, will continue to monitor.

## 2014-11-29 NOTE — Progress Notes (Cosign Needed)
D) Pt is currently in bed resting with eyes closed. Breathing even and unlabored. Pt is easily aroused. A) level 1 obs maintained for safety. Support provided. R) Safety maintained.

## 2014-11-29 NOTE — Progress Notes (Cosign Needed)
Admission note: Pt is a 44 yo african [place] female transferred from [facility] IVC for bizarre bx with a h/o Paranoid Schizophrenia. Pt became agitated as soon as she was taken to Search Room and refused to go in, cooperate with search and vs, or do any admission paperwork. Show of Support called. Verbal de escalation, redirection, and support provided. Pt received Haldol   See CIRT packet please. Pt was placed on continuous obs for safety. Sitter at bedside. Pt currently resting with eyes closed. breathing even and unlabored.

## 2014-11-29 NOTE — Discharge Summary (Signed)
    OB Discharge Summary  Patient Name: Holly Hines DOB: 10-Apr-1971 MRN: [id number]  Date of admission: 11/27/2014     Date of discharge: No discharge date for patient encounter.  Admitting diagnosis: Ectopic Pregnancy - s/p laparotomy   Intrauterine pregnancy: [redacted]w[redacted]d     Secondary diagnosis: psychosis - involuntarily committed     Discharge diagnosis: s/p laparotomy for ectopic pregnancy with right salpingectomy     Hospital course:  Patient presented to [facility]U from Behavioral [facility] for abdominal pain, found to have ectopic abdominal pregnancy through a ruptured salpinx.  Laparotomy was preformed to remove ectopic pregnancy.  Due to patient's unreliable nature, patient was admitted to assure that no complications would arise.  Patient has been stable from a medical standpoint, but has been non-compliant with her psych medications, refusing exams, etc.  Patient being discharged to behavioral health today.  Physical exam  Filed Vitals:   11/28/14 0946 11/28/14 1426 11/28/14 1853 11/28/14 2229  BP: 113/60 133/88 128/61 154/88  Pulse: 57 85 61 63  Temp: 98.7 F (37.1 C) 98.4 F (36.9 C) 98.5 F (36.9 C) 98.4 F (36.9 C)  TempSrc: Oral Oral Oral Oral  Resp: SpO2: 100% 100% 100% 100%   General: alert and no distress, uncooperative and combative. Refuses all other exam, although moving freely in bed and in no apparent pain.  Labs: Lab Results  Component Value Date   WBC 4.9 11/28/2014   HGB 9.2* 11/28/2014   HCT 27.3* 11/28/2014   MCV 88.9 11/28/2014   PLT 253 11/28/2014   CMP Latest Ref Rng 11/28/2014  Glucose 65 - 99 mg/dL 409(W)  BUN 6 - 20 mg/dL <1(X)  Creatinine 9.14 - 1.00 mg/dL 7.82  Sodium 956 - 213 mmol/L 136  Potassium 3.5 - 5.1 mmol/L 4.0  Chloride 101 - 111 mmol/L 108  CO2 22 - 32 mmol/L 22  Calcium 8.9 - 10.3 mg/dL 8.1(L)  Total Protein 6.0 - 8.3 g/dL -  Total Bilirubin 0.3 - 1.2 mg/dL -  Alkaline Phos 39 - 086 U/L -  AST 0 - 37 U/L  -  ALT 0 - 35 U/L -    Discharge instruction: per After Visit Summary and "Baby and Me Booklet".  Medications:   Medication List    TAKE these medications        ibuprofen 600 MG tablet  Commonly known as:  ADVIL,MOTRIN  Take 1 tablet (600 mg total) by mouth every 6 (six) hours as needed (mild pain).     oxyCODONE-acetaminophen 5-325 MG per tablet  Commonly known as:  PERCOCET/ROXICET  Take 1-2 tablets by mouth every 4 (four) hours as needed for severe pain (moderate to severe pain (when tolerating fluids)).     polyethylene glycol powder powder  Commonly known as:  GLYCOLAX/MIRALAX  Take 17 g by mouth 2 (two) times daily. Until daily soft stools  OTC     risperiDONE 1 MG tablet  Commonly known as:  RISPERDAL  Take 1 tablet (1 mg total) by mouth at bedtime.        Diet: routine diet  Activity: Advance as tolerated. Pelvic rest for 6 weeks.   Outpatient follow up:2 weeks  Less than 30 minutes spent in the discharge of this patient.  11/29/2014 STINSON, JACOB JEHIEL, DO

## 2014-11-29 NOTE — Progress Notes (Addendum)
1:1 Note  Pt at this time is in bed resting. Don't look to be in any distress. 1:1 staff is present with Pt at this time. 1:1 monitoring continues for Pt's safety.

## 2014-11-30 LAB — COMPREHENSIVE METABOLIC PANEL
ALBUMIN: 3 g/dL — AB (ref 3.5–5.0)
ALK PHOS: 37 U/L — AB (ref 38–126)
ALT: 8 U/L — ABNORMAL LOW (ref 14–54)
AST: 21 U/L (ref 15–41)
Anion gap: 7 (ref 5–15)
BUN: 9 mg/dL (ref 6–20)
CO2: 25 mmol/L (ref 22–32)
CREATININE: 0.6 mg/dL (ref 0.44–1.00)
Calcium: 8.5 mg/dL — ABNORMAL LOW (ref 8.9–10.3)
Chloride: 109 mmol/L (ref 101–111)
GFR calc Af Amer: >60 mL/min (ref >60)
Glucose, Bld: 69 mg/dL (ref 65–99)
Potassium: 3.2 mmol/L — ABNORMAL LOW (ref 3.5–5.1)
SODIUM: 141 mmol/L (ref 135–145)
Total Bilirubin: 0.9 mg/dL (ref 0.3–1.2)
Total Protein: 6.1 g/dL — ABNORMAL LOW (ref 6.5–8.1)

## 2014-11-30 LAB — TYPE AND SCREEN
ABO/RH(D): O POS
Antibody Screen: NEGATIVE
UNIT DIVISION: 0
Unit division: 0

## 2014-11-30 MED ORDER — POLYETHYLENE GLYCOL 3350 17 G PO PACK
17.0000 g | PACK | Freq: Two times a day (BID) | ORAL | Status: DC
  Administered 2014-11-30 – 2014-12-02 (×5): 17 g via ORAL
  Filled 2014-11-30 (×14): qty 1

## 2014-11-30 NOTE — Progress Notes (Signed)
[facility] MD Progress Note  11/30/2014 11:31 AM Holly Hines  MRN:  [id number] Subjective:  Pt states: "I'm fine, I just want to sleep".  Objective:  Pt seen and chart reviewed. Pt reports that she feels fine and wants to go home. Pt denies suicidal/homicidal ideation and psychosis. Pt does not appear to be responding to internal stimuli. However, she is somnolent and indicates that she does not want to participate in the assessment. Continue 1:1 and no roommate due to pt being intermittently agitated, labile, and uncooperative with staff. Pt did just return from having a D&C for ectopic pregnancy and has been hesitant to allow nursing staff to observe her pads when changed to monitor for blood loss. Her Hgb/Hct is trending downward and we will continue to monitor in regard to BP, O2 sats, and lethargy.    Principal Problem: Paranoid schizophrenia Diagnosis:   Patient Active Problem List   Diagnosis Date Noted  . Schizophrenia, paranoid [F20.0] 10/10/2011    Priority: High  . Schizophrenia, paranoid type [F20.0] 11/29/2014  . Hemoperitoneum due to rupture of right tubal ectopic pregnancy [O00.9, K66.1] 11/28/2014  . tubal ectopic pregnancy unruptured, 12 wks [O00.1]   . Acute psychosis [F29]   . Paranoid schizophrenia [F20.0]   . Hypertension in pregnancy, antepartum [O16.9] 10/23/2012   Total Time spent with patient: 15 minutes   Past Medical History:  Past Medical History  Diagnosis Date  . Mental disorder   . Pregnant   . Hypertension     Past Surgical History  Procedure Laterality Date  . Cesarean section  9 yrs ago    Family History: No family history on file. Social History:  History  Alcohol Use: Not on file     History  Drug Use Not on file    History   Social History  . Marital Status: Single    Spouse Name: N/A  . Number of Children: N/A  . Years of Education: N/A   Social History Main Topics  . Smoking status: Current Some Day Smoker  . Smokeless  tobacco: Not on file  . Alcohol Use: Not on file  . Drug Use: Not on file  . Sexual Activity: Yes    Birth Control/ Protection: None   Other Topics Concern  . Not on file   Social History Narrative   Additional History:    Sleep: Good  Appetite:  Fair   Assessment:  See above  Musculoskeletal: Strength & Muscle Tone: within normal limits Gait & Station: normal Patient leans: N/A   Psychiatric Specialty Exam: Physical Exam  Review of Systems  Gastrointestinal: Positive for abdominal pain.  Psychiatric/Behavioral: Positive for depression. The patient is nervous/anxious.   All other systems reviewed and are negative.   Blood pressure 117/59, pulse 73, resp. rate 20.There is no weight on file to calculate BMI.  General Appearance: Bizarre and Disheveled  Eye Contact::  Good  Speech:  Clear and Coherent and Normal Rate  Volume:  Normal  Mood:  Depressed  Affect:  Depressed and Restricted  Thought Process:  Circumstantial and Loose  Orientation:  Other:  Self  Thought Content:  WDL  Suicidal Thoughts:  No  Homicidal Thoughts:  No  Memory:  Immediate;   Poor Recent;   Poor Remote;   Poor  Judgement:  Poor  Insight:  Lacking  Psychomotor Activity:  Decreased  Concentration:  Fair  Recall:  [place]  Language: Fair  Akathisia:  No  Handed:  AIMS (if indicated):     Assets:  Communication Skills Desire for Improvement Resilience Social Support  ADL's:  Intact  Cognition: WNL  Sleep:        Current Medications: Current Facility-Administered Medications  Medication Dose Route Frequency Provider Last Rate Last Dose  . acetaminophen (TYLENOL) tablet 650 mg  650 mg Oral Q6H PRN Skip Estimable, MD      . alum & mag hydroxide-simeth (MAALOX/MYLANTA) 200-200-20 MG/5ML suspension 30 mL  30 mL Oral Q4H PRN Vinay Rene Kocher, MD      . haloperidol lactate (HALDOL) injection 5 mg  5 mg Intramuscular Q6H PRN Skip Estimable, MD       Or  .  haloperidol (HALDOL) tablet 5 mg  5 mg Oral Q6H PRN Skip Estimable, MD   5 mg at 11/30/14 0956  . ibuprofen (ADVIL,MOTRIN) tablet 600 mg  600 mg Oral Q6H PRN Skip Estimable, MD   600 mg at 11/29/14 2231  . LORazepam (ATIVAN) tablet 2 mg  2 mg Oral Q6H PRN Skip Estimable, MD   2 mg at 11/30/14 0951   Or  . LORazepam (ATIVAN) injection 2 mg  2 mg Intramuscular Q6H PRN Skip Estimable, MD      . magnesium hydroxide (MILK OF MAGNESIA) suspension 30 mL  30 mL Oral Daily PRN Skip Estimable, MD      . oxyCODONE-acetaminophen (PERCOCET/ROXICET) 5-325 MG per tablet 1-2 tablet  1-2 tablet Oral Q4H PRN Skip Estimable, MD   1 tablet at 11/30/14 0951  . polyethylene glycol (MIRALAX / GLYCOLAX) packet 17 g  17 g Oral BID Ursula Alert, MD   17 g at 11/30/14 0951  . risperiDONE (RISPERDAL) tablet 1 mg  1 mg Oral QHS Skip Estimable, MD   1 mg at 11/29/14 2232    Lab Results:  Results for orders placed or performed during the hospital encounter of 11/29/14 (from the past 48 hour(s))  Comprehensive metabolic panel     Status: Abnormal   Collection Time: 11/30/14  6:30 AM  Result Value Ref Range   Sodium 141 135 - 145 mmol/L   Potassium 3.2 (L) 3.5 - 5.1 mmol/L   Chloride 109 101 - 111 mmol/L   CO2 25 22 - 32 mmol/L   Glucose, Bld 69 65 - 99 mg/dL   BUN 9 6 - 20 mg/dL   Creatinine, Ser 0.60 0.44 - 1.00 mg/dL   Calcium 8.5 (L) 8.9 - 10.3 mg/dL   Total Protein 6.1 (L) 6.5 - 8.1 g/dL   Albumin 3.0 (L) 3.5 - 5.0 g/dL   AST 21 15 - 41 U/L   ALT 8 (L) 14 - 54 U/L   Alkaline Phosphatase 37 (L) 38 - 126 U/L   Total Bilirubin 0.9 0.3 - 1.2 mg/dL   GFR calc non Af Amer >60 >60 mL/min   GFR calc Af Amer >60 >60 mL/min    Comment: (NOTE) The eGFR has been calculated using the CKD EPI equation. This calculation has not been validated in all clinical situations. eGFR's persistently <60 mL/min signify possible Chronic Kidney Disease.    Anion gap 7 5 - 15    Comment: Performed at [facility]    Physical Findings: AIMS:  , ,  ,  ,    CIWA:    COWS:     Treatment Plan Summary: Paranoid schizophrenia, unstable, managed as below:  Daily contact with patient to assess and  evaluate symptoms and progress in treatment and Medication management   Medications: -Continue Haldol 5mg  PO/IM q6h prn agitation  -Continue Ativan 2mg  q6h prn severe anxiety -Continue Percocet 5-325 one-two tabs q4h prn pain from surgery -Continue risperidone 1mg  qhs for mood stabilization  Medical Decision Making:  New problem, with additional work up planned, Review of Psycho-Social Stressors (1), Review or order clinical lab tests (1), Review of Medication Regimen & Side Effects (2) and Review of New Medication or Change in Dosage (2)  Benjamine Mola, FNP-BC  11/30/2014, 11:31 AM Patient seen face-to-face for psychiatric evaluation, chart reviewed and case discussed with the physician extender and developed treatment plan. Reviewed the information documented and agree with the treatment plan. Corena Pilgrim, MD

## 2014-11-30 NOTE — BHH Counselor (Signed)
Adult Comprehensive Assessment  Patient ID: Holly Hines, female   DOB: 1970/11/29, 44 y.o.   MRN: [id number]  Information Source: Information source: Patient  Current Stressors:  Educational / Learning stressors: none Employment / Job issues: unemployed Family Relationships: little contact w family Surveyor, quantity / Lack of resources (include bankruptcy): no income Housing / Lack of housing: homeless Physical health (include injuries & life threatening diseases): recent ectopic pregnancy Social relationships: little/no support Substance abuse: denies Bereavement / Loss: none noted  Living/Environment/Situation:  Living Arrangements: Alone Living conditions (as described by patient or guardian): Patient states she stays at [facility] off and on, cannot state where she stays when not in shelter How long has patient lived in current situation?: several months What is atmosphere in current home: Dangerous, Temporary  Family History:  Marital status: Single Does patient have children?: Yes How many children?: 1 How is patient's relationship with their children?: 35 year old daughter lives w aunt, pt  has not seen in quite some time  Childhood History:  By whom was/is the patient raised?: Mother, Father Additional childhood history information: raised in [place] Description of patient's relationship with caregiver when they were a child: did not discuss Patient's description of current relationship with people who raised him/her: both mother and father deceased Does patient have siblings?: Yes Number of Siblings: 3 Description of patient's current relationship with siblings: Pt did not state how many brothers/sisters she has, says one brother lives in [place] w his girlfriend and she does not see him often Did patient suffer any verbal/emotional/physical/sexual abuse as a child?: No Did patient suffer from severe childhood neglect?: No Has patient ever been sexually  abused/assaulted/raped as an adolescent or adult?: No Was the patient ever a victim of a crime or a disaster?: No Witnessed domestic violence?: No Has patient been effected by domestic violence as an adult?: No  Education:  Highest grade of school patient has completed: GED obtained in [place] from [employer] Currently a Consulting civil engineer?: No Learning disability?: No  Employment/Work Situation:   Employment situation: Unemployed Patient's job has been impacted by current illness: Yes Describe how patient's job has been impacted: unknown What is the longest time patient has a held a job?: unknown Where was the patient employed at that time?: 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, unclear when or for how long Has patient ever been in the [employer]?: No Has patient ever served in Buyer, retail?: No  Financial Resources:   Financial resources: No income Does patient have a Lawyer or guardian?: No  Alcohol/Substance Abuse:   What has been your use of drugs/alcohol within the last 12 months?: Patient denies If attempted suicide, did drugs/alcohol play a role in this?: No Alcohol/Substance Abuse Treatment Hx: Denies past history Has alcohol/substance abuse ever caused legal problems?: No  Social Support System:   Forensic psychologist System: None Describe Community Support System: patient cannot identify any supportive people in community, estranged from family, refused to provide consent for [name] to speak w any homeless services providers with whom she may have interacted Type of faith/religion: unknown How does patient's faith help to cope with current illness?: unknown  Leisure/Recreation:   Leisure and Hobbies: unknown  Strengths/Needs:   What things does the patient do well?: 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 In what areas does patient struggle / problems for patient: no job, no money, no housing  Discharge Plan:   Does patient have access to  transportation?:  No Plan for no access to transportation at discharge: can be given bus pass Will patient be returning to same living situation after discharge?: No Plan for living situation after discharge: unclear where patient will discharge to as she is post surgery and homeless Currently receiving community mental health services: No If no, would patient like referral for services when discharged?: No (patient did not want to discuss at present) Does patient have financial barriers related to discharge medications?: Yes Patient description of barriers related to discharge medications: no income/insurance.   Summary/Recommendations:    Patient is a 44 year old AA female, diagnosed w paranoid schizophrenia and admitted to [facility] after recent surgery for ectopic pregnancy.  Patient is lying in bed w sheet over head, answers questions w few words but is able to follow conversation.  Denies current pain, says she is simply tired.  Pt IVC'd after concerns expressed by homeless services providers in community who noted patient's increasingly bizarre behavior and dangerous decisions (lying in road).  [facility] staff state that patient has not been taking her medications as prescribed, patient denies that she is currently under care by any mental health provider other than to say that someone at the [facility] had given her "a pill."  Patient has no income, is not on disability, has no insurance, and does not have a bed at the [facility] at present.  Patient says she has little contact w any siblings, including one who lives in [place], her parents are deceased, and that she has no social support in community.  She does note that she has a daughter who is in custody of an aunt, pt has not seen daughter in some time.  Patient's answers are brief, [name] unable to complete full assessment due to patient's current medical condition (post surgery).  Told patient [name] would return to discuss discharge planning options when patient was  more able to participate in the process.  Patient will benefit from hospitalization to receive psychoeducation and group therapy services to increase coping skills for psychotic disorder, safety and support, milieu therapy, medications management, and nursing support.  Patient will develop appropriate coping skills for dealing w emotions, stabilize on medications, and develop greater insight into and acceptance of his current illness.  CSWs will develop discharge plan to include family support and referral to appropriate after care services, patient will need help w both medical and social service needs and can benefit from referrals to homeless services providers in community if patient will consent.  Patient did not allow [name] to contact any collateral informants at this time.    Santa Genera, LCSW Clinical Social Worker   Santa Genera C. 11/30/2014

## 2014-11-30 NOTE — BHH Group Notes (Signed)
BHH LCSW Group Therapy    Description of Group:   Learn how to identify obstacles, self-sabotaging and enabling behaviors, what are they, why do we do them and what needs do these behaviors meet? Discuss unhealthy relationships and how to have positive healthy boundaries with those that sabotage and enable. Explore aspects of self-sabotage and enabling in yourself and how to limit these self-destructive behaviors in everyday life.  Type of Therapy:  Group Therapy: Avoiding Self-Sabotaging and Enabling Behaviors  Participation Level:  Did Not Attend, sleeping  Caralyn Twining, LCSW Clinical Social Worker  

## 2014-11-30 NOTE — Progress Notes (Signed)
D: Pt observed sleeping in bed at time of rounds. Respirations noted.  A: 1:1 level of observation maintained as ordered. Assigned staff in attendance at all times. Continued support and availability provided to pt.  R: Safety maintained on unit. Continue plan of care.

## 2014-11-30 NOTE — Progress Notes (Signed)
1:1 Note  Pt at this time is in bed resting. Pt do not look to be in any distress at this time. 1:1 staff is present in room with Pt at this time. 1:1 monitoring continues for Pt's safety. 15-minute safety checks also continues at this time.  

## 2014-11-30 NOTE — Plan of Care (Signed)
Problem: Alteration in thought process Goal: STG-Patient does not respond to command hallucinations Outcome: Progressing Pt appears guarded but denies AVH.

## 2014-11-30 NOTE — Progress Notes (Signed)
D: Pt awake in bathroom at this time attending to her ADLs. Presents with a blunt affect and irritable mood. Denies SI, HI and AVH with node of head. Pt is guarded with poor eye contact.  Moderate amount of blood noted on pt's bed sheets and on clothes (S/P surgical procedure-see chart). Nursing staff and house keeping attendant assisted pt in cleaning up her room and bathroom to maintained a clean and sanitized environment. Stayed in bed so far this shift.  A: 1:1 contact made with pt to conduct shift assessment and address needs. All medications administered as prescribed including PRN Ativan and Haldol for anxiety and agitation. Emotional support and availability offered. Pt encouraged to voice concerns. 1:1 observation level maintained as order for pt's safety. R: Pt complied with scheduled and PRN medications with verbal encouragements when offered. Selectively mute at intervals during conversations. Remains safe on unit. Continue plan of care.

## 2014-11-30 NOTE — Progress Notes (Addendum)
1:1 Note  Pt at this time is in bed resting. Pt does not look to be in any distress at this time. 1:1 staff is present in room with Pt at this time. 1:1 monitoring continues for Pt's safety. 15-minute safety checks also continues at this time. 

## 2014-11-30 NOTE — BHH Group Notes (Signed)
B[facility] Group Notes:  (Nursing/MHT/Case Management/Adjunct)  Date:  11/30/2014  Time:  1000  Type of Therapy:  Nurse Education  Participation Level:  Did Not Attend Summary of Progress/Problems: Pt did not attend scheduled group this AM; she was irritable with flat affect, selectively mute and will not respond to staff when encouraged.  Sherryl Manges 11/30/2014, 1000

## 2014-11-30 NOTE — Progress Notes (Signed)
D: Pt awake in bed at this time. Affect flat with irritable mood. Remains selectively mute, minimal interactions on as need basis with others but no gestures of self injurious behavior or outburst to note thus far. Meal and fluids offered and tolerated well.  A: Scheduled medication at 1700 administered as ordered. Pain assessed. Support and availability offered. Safety maintained on 1:1 level of observation with assigned staff at bedside at all times.  R: Pt denies pain when assessed. Took her scheduled Miralax 17 gm at 1700 without issues. Denies adverse drug reactions. Safety maintained on unit.

## 2014-11-30 NOTE — Progress Notes (Signed)
1:1 Note  Pt at this time is in bed getting her lab work done. Pt was cooperative during the lab process. Pt at this time does not look to be in any distress at this time. 1:1 staff is present in room with Pt at this time. 1:1 monitoring continues for Pt's safety. 15-minute safety checks also continues at this time.

## 2014-11-30 NOTE — Progress Notes (Signed)
Patient ID: Holly Hines, female   DOB: Aug 25, 1970, 44 y.o.   MRN: [id number] 1:1 note 11/30/2014 _0  D: Patient in room on approach. Pt mood and affect appeared depressed and irritable. Pt had to be encouraged to take shower but changed underwear/peripad and wiped self down. Pt denies SI/HI/AVH and pain.  No acute distressed noted at this time.   A: Met with pt 1:1. Medications administered as prescribed. Pt refused writer to check abdominal incision. Support and encouragement provided. Bed and linen changed. Pt encouraged to discuss feelings and come to staff with any question or concerns.   R: Patient remains safe is safe and complaint with medications.

## 2014-11-30 NOTE — Plan of Care (Signed)
Problem: Ineffective individual coping Goal: STG: Patient will remain free from self harm Outcome: Progressing Pt maintained on 1:1 level of observation as ordered for safety without gestures of self injurious behavior to note thus far this shift.

## 2014-12-01 LAB — CBC WITH DIFFERENTIAL/PLATELET
BASOS PCT: 1 % (ref 0–1)
Basophils Absolute: 0.1 10*3/uL (ref 0.0–0.1)
EOS ABS: 0.5 10*3/uL (ref 0.0–0.7)
EOS PCT: 8 % — AB (ref 0–5)
HCT: 24.3 % — ABNORMAL LOW (ref 36.0–46.0)
HEMOGLOBIN: 8.3 g/dL — AB (ref 12.0–15.0)
LYMPHS PCT: 37 % (ref 12–46)
Lymphs Abs: 2.5 10*3/uL (ref 0.7–4.0)
MCH: 30.5 pg (ref 26.0–34.0)
MCHC: 34.2 g/dL (ref 30.0–36.0)
MCV: 89.3 fL (ref 78.0–100.0)
MONOS PCT: 7 % (ref 3–12)
Monocytes Absolute: 0.4 10*3/uL (ref 0.1–1.0)
NEUTROS ABS: 3.2 10*3/uL (ref 1.7–7.7)
NEUTROS PCT: 48 % (ref 43–77)
Platelets: 257 10*3/uL (ref 150–400)
RBC: 2.72 MIL/uL — ABNORMAL LOW (ref 3.87–5.11)
RDW: 13.5 % (ref 11.5–15.5)
WBC: 6.8 10*3/uL (ref 4.0–10.5)

## 2014-12-01 MED ORDER — IBUPROFEN 100 MG/5ML PO SUSP
800.0000 mg | Freq: Three times a day (TID) | ORAL | Status: DC
  Administered 2014-12-02: 800 mg via ORAL
  Filled 2014-12-01 (×5): qty 40

## 2014-12-01 MED ORDER — IBUPROFEN 100 MG/5ML PO SUSP
800.0000 mg | Freq: Three times a day (TID) | ORAL | Status: DC
  Filled 2014-12-01 (×3): qty 40

## 2014-12-01 NOTE — Progress Notes (Signed)
Patient ID: Holly Hines, female   DOB: 1971-02-15, 44 y.o.   MRN: [id number] 1:1 note 12/01/2014   D: Pt requested ibuprofen for headache. Rated as 5 on 0-10 scale. Patient in bed sleeping. Respiration regular and unlabored. No sign of distress noted at this time A: 1:1 observation for safety R:Patient is safe. Sitter at bedside. 1:1 continues

## 2014-12-01 NOTE — Progress Notes (Signed)
D: Patient in bed, little eye contact or verbal interaction. Patient walked to nurses station twice start of shift. Patient denies SI/HI/AVH, and pain. Patient refused scheduled ibuprofen.  A: Staff to monitor Q 15 mins for safety. Encouragement and support offered.Encouraged fluids as tolerated. R: Patient remains safe on the unit. Encouraged patient to walk. Remains on 1:1

## 2014-12-01 NOTE — Progress Notes (Signed)
Patient ID: Holly Hines, female   DOB: 1971/01/15, 44 y.o.   MRN: [id number] 1:1 note 12/01/2014   D:  Patient in bed sleeping. Respiration regular and unlabored. No sign of distress noted at this time A: 1:1 observation for safety R:Patient is safe. Sitter at bedside. 1:1 continues

## 2014-12-01 NOTE — Progress Notes (Signed)
Nursing 1:1 note : remains in bed with covers over face,nonverbal, appears to be sleeping. Change pad earlier had scant amount of bloody drainage. Refused shower. Ate 3/4 of lunch water pitcher at bedside.  Continuous 1:1 remains for safety.

## 2014-12-01 NOTE — BHH Group Notes (Signed)
[facility] LCSW Group Therapy  12/01/2014 1:15 PM   Type of Therapy:  Group Therapy  Participation Level:  Did Not Attend  Reyes Ivan, LCSW 12/01/2014 12:54 PM

## 2014-12-01 NOTE — Progress Notes (Signed)
[facility] MD Progress Note  12/01/2014  Holly Hines  MRN:  [id number] Subjective:  Pt states: "I'm better, just tired."  Objective:  Pt seen and chart reviewed. Pt reports that she feels better overall. Pt is still sleeping a lot and her Hgb has trended downward slightly although not alarming at this point; will continue to monitor. Pt has been intermittently non-compliant with medications but is improving. She was able to form more coherent sentences today and uncovered her face to talk which is an improvement from yesterday. Continue 1:1 and no roommate due to pt being intermittently agitated, labile, and uncooperative with staff. Pt did just return from having a D&C for ectopic pregnancy and has been hesitant to allow nursing staff to observe her pads when changed to monitor for blood loss.  Principal Problem: Paranoid schizophrenia Diagnosis:   Patient Active Problem List   Diagnosis Date Noted  . Schizophrenia, paranoid [F20.0] 10/10/2011    Priority: High  . Schizophrenia, paranoid type [F20.0] 11/29/2014  . Hemoperitoneum due to rupture of right tubal ectopic pregnancy [O00.9, K66.1] 11/28/2014  . tubal ectopic pregnancy unruptured, 12 wks [O00.1]   . Acute psychosis [F29]   . Paranoid schizophrenia [F20.0]   . Hypertension in pregnancy, antepartum [O16.9] 10/23/2012   Total Time spent with patient: 15 minutes   Past Medical History:  Past Medical History  Diagnosis Date  . Mental disorder   . Pregnant   . Hypertension     Past Surgical History  Procedure Laterality Date  . Cesarean section  9 yrs ago    Family History: No family history on file. Social History:  History  Alcohol Use: Not on file     History  Drug Use Not on file    History   Social History  . Marital Status: Single    Spouse Name: N/A  . Number of Children: N/A  . Years of Education: N/A   Social History Main Topics  . Smoking status: Current Some Day Smoker  . Smokeless tobacco: Not  on file  . Alcohol Use: Not on file  . Drug Use: Not on file  . Sexual Activity: Yes    Birth Control/ Protection: None   Other Topics Concern  . Not on file   Social History Narrative   Additional History:    Sleep: Good  Appetite:  Fair   Assessment:  See above  Musculoskeletal: Strength & Muscle Tone: within normal limits Gait & Station: normal Patient leans: N/A   Psychiatric Specialty Exam: Physical Exam  Review of Systems  Gastrointestinal: Positive for abdominal pain.  Psychiatric/Behavioral: Positive for depression. The patient is nervous/anxious.   All other systems reviewed and are negative.   Blood pressure 119/71, pulse 80, temperature 97.7 F (36.5 C), temperature source Oral, resp. rate 16.There is no weight on file to calculate BMI.  General Appearance: Bizarre and Disheveled  Eye Contact::  Good  Speech:  Clear and Coherent and Normal Rate  Volume:  Normal  Mood:  Depressed  Affect:  Depressed and Restricted  Thought Process:  Circumstantial and Loose  Orientation:  Other:  Self  Thought Content:  WDL  Suicidal Thoughts:  No  Homicidal Thoughts:  No  Memory:  Immediate;   Poor Recent;   Poor Remote;   Poor  Judgement:  Poor  Insight:  Lacking  Psychomotor Activity:  Decreased  Concentration:  Fair  Recall:  [place]  Language: Fair  Akathisia:  No  Handed:  AIMS (if indicated):     Assets:  Communication Skills Desire for Improvement Resilience Social Support  ADL's:  Intact  Cognition: WNL  Sleep:        Current Medications: Current Facility-Administered Medications  Medication Dose Route Frequency Provider Last Rate Last Dose  . acetaminophen (TYLENOL) tablet 650 mg  650 mg Oral Q6H PRN Skip Estimable, MD      . alum & mag hydroxide-simeth (MAALOX/MYLANTA) 200-200-20 MG/5ML suspension 30 mL  30 mL Oral Q4H PRN Vinay Rene Kocher, MD      . haloperidol lactate (HALDOL) injection 5 mg  5 mg Intramuscular Q6H  PRN Skip Estimable, MD       Or  . haloperidol (HALDOL) tablet 5 mg  5 mg Oral Q6H PRN Skip Estimable, MD   5 mg at 12/01/14 0917  . ibuprofen (ADVIL,MOTRIN) 100 MG/5ML suspension 800 mg  800 mg Oral 3 times per day Benjamine Mola, FNP      . LORazepam (ATIVAN) tablet 2 mg  2 mg Oral Q6H PRN Skip Estimable, MD   2 mg at 12/01/14 2671   Or  . LORazepam (ATIVAN) injection 2 mg  2 mg Intramuscular Q6H PRN Skip Estimable, MD      . magnesium hydroxide (MILK OF MAGNESIA) suspension 30 mL  30 mL Oral Daily PRN Skip Estimable, MD      . oxyCODONE-acetaminophen (PERCOCET/ROXICET) 5-325 MG per tablet 1-2 tablet  1-2 tablet Oral Q4H PRN Skip Estimable, MD   1 tablet at 12/01/14 1700  . polyethylene glycol (MIRALAX / GLYCOLAX) packet 17 g  17 g Oral BID Ursula Alert, MD   17 g at 12/01/14 1700  . risperiDONE (RISPERDAL) tablet 1 mg  1 mg Oral QHS Skip Estimable, MD   1 mg at 11/30/14 2101    Lab Results:  Results for orders placed or performed during the hospital encounter of 11/29/14 (from the past 48 hour(s))  Comprehensive metabolic panel     Status: Abnormal   Collection Time: 11/30/14  6:30 AM  Result Value Ref Range   Sodium 141 135 - 145 mmol/L   Potassium 3.2 (L) 3.5 - 5.1 mmol/L   Chloride 109 101 - 111 mmol/L   CO2 25 22 - 32 mmol/L   Glucose, Bld 69 65 - 99 mg/dL   BUN 9 6 - 20 mg/dL   Creatinine, Ser 0.60 0.44 - 1.00 mg/dL   Calcium 8.5 (L) 8.9 - 10.3 mg/dL   Total Protein 6.1 (L) 6.5 - 8.1 g/dL   Albumin 3.0 (L) 3.5 - 5.0 g/dL   AST 21 15 - 41 U/L   ALT 8 (L) 14 - 54 U/L   Alkaline Phosphatase 37 (L) 38 - 126 U/L   Total Bilirubin 0.9 0.3 - 1.2 mg/dL   GFR calc non Af Amer >60 >60 mL/min   GFR calc Af Amer >60 >60 mL/min    Comment: (NOTE) The eGFR has been calculated using the CKD EPI equation. This calculation has not been validated in all clinical situations. eGFR's persistently <60 mL/min signify possible Chronic Kidney Disease.    Anion gap 7 5 - 15     Comment: Performed at [facility]  CBC with Differential/Platelet     Status: Abnormal   Collection Time: 12/01/14  6:30 AM  Result Value Ref Range   WBC 6.8 4.0 - 10.5 K/uL    Comment: WHITE COUNT CONFIRMED ON SMEAR  RBC 2.72 (L) 3.87 - 5.11 MIL/uL   Hemoglobin 8.3 (L) 12.0 - 15.0 g/dL   HCT 24.3 (L) 36.0 - 46.0 %   MCV 89.3 78.0 - 100.0 fL   MCH 30.5 26.0 - 34.0 pg   MCHC 34.2 30.0 - 36.0 g/dL   RDW 13.5 11.5 - 15.5 %   Platelets 257 150 - 400 K/uL    Comment: LARGE PLATELETS PRESENT   Neutrophils Relative % 48 43 - 77 %   Neutro Abs 3.2 1.7 - 7.7 K/uL   Lymphocytes Relative 37 12 - 46 %   Lymphs Abs 2.5 0.7 - 4.0 K/uL   Monocytes Relative 7 3 - 12 %   Monocytes Absolute 0.4 0.1 - 1.0 K/uL   Eosinophils Relative 8 (H) 0 - 5 %   Eosinophils Absolute 0.5 0.0 - 0.7 K/uL   Basophils Relative 1 0 - 1 %   Basophils Absolute 0.1 0.0 - 0.1 K/uL    Comment: Performed at [facility]    Physical Findings: AIMS:  , ,  ,  ,    CIWA:    COWS:     Treatment Plan Summary: Paranoid schizophrenia, unstable, managed as below:  Daily contact with patient to assess and evaluate symptoms and progress in treatment and Medication management   Medications: -Continue Haldol 5mg  PO/IM q6h prn agitation  -Continue Ativan 2mg  q6h prn severe anxiety -Continue Percocet 5-325 one-two tabs q4h prn pain from surgery -Continue risperidone 1mg  qhs for mood stabilization -Ibuprofen 800mg  liquid (pt refuses pills) PO tid x 2 days to reduce bleeding secondary to inflammatory processes from a gynecological perspective; if pt continues to saturate >3 pads in 3 hours, send to ED immediately  Labs: Hgb/Hct PM 12/02/14 to determine if stabilizing or continuing to trend downward:   Medical Decision Making:  New problem, with additional work up planned, Review of Psycho-Social Stressors (1), Review or order clinical lab tests (1), Review of Medication Regimen & Side Effects  (2) and Review of New Medication or Change in Dosage (2)  Benjamine Mola, FNP-BC  12/01/2014, 1:44 PM Patient seen face-to-face for psychiatric evaluation, chart reviewed and case discussed with the physician extender and developed treatment plan. Reviewed the information documented and agree with the treatment plan. Corena Pilgrim, MD

## 2014-12-01 NOTE — Progress Notes (Signed)
Nursing 1;1 note : Remains irritable, poor eye contact. Talks into blanket with a yes or no answer. Pt was able to ambulate the length of the hall with encouragement    A - Observed pt without interacting with peers, avoids group.Support and encouragement offered, safety maintained with q 15 minutes.Meals and fluids tolerated well  R-Contracts for safety, Pt is compliant with medications Instructed her on importance of exercise to prevent pneumonia post op. Remains on 1:1

## 2014-12-01 NOTE — Progress Notes (Signed)
Nursing 1:1 note : D-  Patients presents with flat affect, withdrawn and non-verbal. Stays in bed only to get out to go to bathroom. Writer asked to see pt's dsg she refused stating she had none with covers up to chin. Pt becomes irritated when asked her any questions.  A- Support and Encouragement provided, maintained on 1:1. Doesn't initiate conversation  R- Will continue to monitor on q 15 minute checks for safety, compliant with medications, refused to participate in programming. Isolating in room

## 2014-12-02 DIAGNOSIS — F200 Paranoid schizophrenia: Principal | ICD-10-CM

## 2014-12-02 MED ORDER — RISPERIDONE 1 MG PO TABS
1.0000 mg | ORAL_TABLET | Freq: Every day | ORAL | Status: DC
  Administered 2014-12-03 – 2014-12-05 (×2): 1 mg via ORAL
  Filled 2014-12-02 (×4): qty 1

## 2014-12-02 MED ORDER — BENZTROPINE MESYLATE 0.5 MG PO TABS
0.5000 mg | ORAL_TABLET | ORAL | Status: DC
  Administered 2014-12-02 – 2014-12-05 (×3): 0.5 mg via ORAL
  Filled 2014-12-02 (×8): qty 1

## 2014-12-02 MED ORDER — POTASSIUM CHLORIDE CRYS ER 20 MEQ PO TBCR
20.0000 meq | EXTENDED_RELEASE_TABLET | Freq: Three times a day (TID) | ORAL | Status: AC
  Administered 2014-12-02 – 2014-12-03 (×2): 20 meq via ORAL
  Filled 2014-12-02 (×4): qty 1

## 2014-12-02 MED ORDER — BENZTROPINE MESYLATE 0.5 MG PO TABS
0.5000 mg | ORAL_TABLET | Freq: Every day | ORAL | Status: DC
  Filled 2014-12-02 (×2): qty 1

## 2014-12-02 MED ORDER — RISPERIDONE 2 MG PO TABS
2.0000 mg | ORAL_TABLET | Freq: Every day | ORAL | Status: DC
  Administered 2014-12-02: 2 mg via ORAL
  Filled 2014-12-02 (×2): qty 1

## 2014-12-02 MED ORDER — IBUPROFEN 400 MG PO TABS
800.0000 mg | ORAL_TABLET | Freq: Three times a day (TID) | ORAL | Status: AC
Start: 2014-12-02 — End: 2014-12-04
  Administered 2014-12-02 – 2014-12-03 (×4): 800 mg via ORAL
  Filled 2014-12-02 (×7): qty 2

## 2014-12-02 NOTE — Progress Notes (Signed)
D: Pt took a nap earlier this shift. Showered and changed gown. Refused scheduled Potassium when offered.  A: Continued support and encouragement offered. Verbal encouragement provided to pt to voice concerns and to attend to ADL needs. EKG done and was tolerated well by pt. 1:1 level of observation remains effective as ordered for safety. R: Pt receptive to care. Denies adverse drug reactions. Remains safe on unit.

## 2014-12-02 NOTE — Progress Notes (Signed)
2200 1:1 Nursing Note Patient in her room in bed at this time.  Patient agreed to take bedtime medication.  Writer offered patient a snack and patient accepted.  Patient irritable when speaking with Clinical research associate.  Patient refused to let writer see her lap sites. Writer encouraged patient questions.  Patient had no complaints.  Will ocntinue to monitor.

## 2014-12-02 NOTE — Tx Team (Signed)
Interdisciplinary Treatment Plan Update (Adult)  Date:  12/02/2014   Time Reviewed:  3:27 PM   Progress in Treatment: Attending groups: inconsistently Participating in groups:  Yes, when in attendance Taking medication as prescribed:  Yes. Tolerating medication:  Yes. Family/Significant othe contact made:  No Patient understands diagnosis:  No  Limited insight Discussing patient identified problems/goals with staff:  Yes, see initial care plan. Medical problems stabilized or resolved:  Yes. Denies suicidal/homicidal ideation: Yes. Issues/concerns per patient self-inventory:  No. Other:  New problem(s) identified:  Discharge Plan or Barriers:  Return to the streets of [place], follow up outpt  Reason for Continuation of Hospitalization: Hallucinations Medication stabilization Other; describe Disorganization, paranoia  Comments: 12/02/2014  "I want to go home." Pt continues to be on 1:1 for disorganized behavior . Pt today continues to be unable to provide sufficient details about her hx or how she ended up in the hospital.  Pt reports having a daughter who lives with her aunt , but unable to give other details . Pt also appears to be smiling inappropriately , appears to be responding to internal stimuli. Pt also with psychomotor retardation , is paranoid , delusional - states she was being poisoned by a friend outside the hospital And that she continues to feel that she cannot trust any one , since any one here could do the same to her . Pt denies AH/VH/SI/HI. Pt however was unable to sit through the entire evaluation and got up and left the room before the end of the session. Patient with severe paranoia , which could be the reason for her current behavior.  Risperdal trial  Estimated length of stay:  4-5 days  New goal(s):  Review of initial/current patient goals per problem list:   Review of initial/current patient goals per problem list:  1. Goal(s): Patient will participate in  aftercare plan   Met: Yes   Target date: 3-5 days post admission date   As evidenced by: Patient will participate within aftercare plan AEB aftercare provider and housing plan at discharge being identified.  12/02/2014:  States she will return to the streets, follow up outpt   2. Goal (s): Patient will exhibit decreased depressive symptoms and suicidal ideations.   Met: No   Target date: 3-5 days post admission date   As evidenced by: Patient will utilize self rating of depression at 3 or below and demonstrate decreased signs of depression or be deemed stable for discharge by MD. 12/02/2014   Pt rates depression at a 5 today     5. Goal(s): Patient will demonstrate decreased signs of psychosis  * Met: No  * Target date: 3-5 days post admission date  * As evidenced by: Patient will demonstrate decreased frequency of AVH or return to baseline function 12/02/2014  Pt presents as disorganized, paranoid          Attendees: Patient:  12/02/2014 3:27 PM   Family:   12/02/2014 3:27 PM   Physician:  Saramma Eappen, MD 12/02/2014 3:27 PM   Nursing:   Patrice White, RN 12/02/2014 3:27 PM   [name]:     , LCSW   12/02/2014 3:27 PM   Other:  12/02/2014 3:27 PM   Other:   12/02/2014 3:27 PM   Other:  Jennifer Clark, Nurse CM 12/02/2014 3:27 PM   Other:  Valerie Enoch, [facility] 12/02/2014 3:27 PM   Other:  Delora Sutton, P4CC  12/02/2014 3:27 PM   Other:  12/02/2014 3:27 PM     Other:  12/02/2014 3:27 PM   Other:  12/02/2014 3:27 PM   Other:  12/02/2014 3:27 PM   Other:  12/02/2014 3:27 PM   Other:   12/02/2014 3:27 PM    Scribe for Treatment Team:   ,  B, 12/02/2014 3:27 PM    

## 2014-12-02 NOTE — BHH Counselor (Signed)
Adult Comprehensive Assessment  Patient ID: Holly Hines, female   DOB: 1970/11/29, 44 y.o.   MRN: [id number]  Information Source: Information source: Patient  Current Stressors:  Educational / Learning stressors: none Employment / Job issues: unemployed Family Relationships: little contact w family Surveyor, quantity / Lack of resources (include bankruptcy): no income Housing / Lack of housing: homeless Physical health (include injuries & life threatening diseases): recent ectopic pregnancy Social relationships: little/no support Substance abuse: denies Bereavement / Loss: none noted  Living/Environment/Situation:  Living Arrangements: Alone Living conditions (as described by patient or guardian): Patient states she stays at [facility] off and on, cannot state where she stays when not in shelter How long has patient lived in current situation?: several months What is atmosphere in current home: Dangerous, Temporary  Family History:  Marital status: Single Does patient have children?: Yes How many children?: 1 How is patient's relationship with their children?: 35 year old daughter lives w aunt, pt  has not seen in quite some time  Childhood History:  By whom was/is the patient raised?: Mother, Father Additional childhood history information: raised in [place] Description of patient's relationship with caregiver when they were a child: did not discuss Patient's description of current relationship with people who raised him/her: both mother and father deceased Does patient have siblings?: Yes Number of Siblings: 3 Description of patient's current relationship with siblings: Pt did not state how many brothers/sisters she has, says one brother lives in [place] w his girlfriend and she does not see him often Did patient suffer any verbal/emotional/physical/sexual abuse as a child?: No Did patient suffer from severe childhood neglect?: No Has patient ever been sexually  abused/assaulted/raped as an adolescent or adult?: No Was the patient ever a victim of a crime or a disaster?: No Witnessed domestic violence?: No Has patient been effected by domestic violence as an adult?: No  Education:  Highest grade of school patient has completed: GED obtained in [place] from [employer] Currently a Consulting civil engineer?: No Learning disability?: No  Employment/Work Situation:   Employment situation: Unemployed Patient's job has been impacted by current illness: Yes Describe how patient's job has been impacted: unknown What is the longest time patient has a held a job?: unknown Where was the patient employed at that time?: 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, unclear when or for how long Has patient ever been in the [employer]?: No Has patient ever served in Buyer, retail?: No  Financial Resources:   Financial resources: No income Does patient have a Lawyer or guardian?: No  Alcohol/Substance Abuse:   What has been your use of drugs/alcohol within the last 12 months?: Patient denies If attempted suicide, did drugs/alcohol play a role in this?: No Alcohol/Substance Abuse Treatment Hx: Denies past history Has alcohol/substance abuse ever caused legal problems?: No  Social Support System:   Forensic psychologist System: None Describe Community Support System: patient cannot identify any supportive people in community, estranged from family, refused to provide consent for [name] to speak w any homeless services providers with whom she may have interacted Type of faith/religion: unknown How does patient's faith help to cope with current illness?: unknown  Leisure/Recreation:   Leisure and Hobbies: unknown  Strengths/Needs:   What things does the patient do well?: 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 In what areas does patient struggle / problems for patient: no job, no money, no housing  Discharge Plan:   Does patient have access to  transportation?:  No Plan for no access to transportation at discharge: can be given bus pass Will patient be returning to same living situation after discharge?: No Plan for living situation after discharge: unclear where patient will discharge to as she is post surgery and homeless Currently receiving community mental health services: No If no, would patient like referral for services when discharged?: No (patient did not want to discuss at present) Does patient have financial barriers related to discharge medications?: Yes Patient description of barriers related to discharge medications: no income/insurance.   Summary/Recommendations:   Summary and Recommendations (to be completed by the evaluator): "44 year old AA female, diag paranoid schizophrenia admitted to [facility] after recent surgery for ectopic pregnancy. Patient is lying in bed w sheet over head, answers questions w few words but is able to follow conversation. Pt IVC'd after concerns expressed by homeless services providers in community who noted patient's increasingly bizarre behavior and dangerous decisions (lying in road). [facility] staff state that patient has not been taking her medications as prescribed, patient denies that she is currently under care by any mental health provider other than to say that [facility] the [facility] had [facility] her ""a pill."" Patient has no income, is not on disability, has no insurance, and does not have a bed at the [facility] at present. Patient says she has little contact w any siblings, including one who lives in [place], her parents are deceased, and that she has no social support in community. She does note that she has a daughter who is in custody of an aunt, pt has not seen daughter in some time. Patient's answers are brief, [name] unable to complete full assessment due to patient's current medical condition (post surgery). Told patient [name] would return to discuss discharge planning options when patient was more  able to participate in the process."  [place], [place] Distance B. 12/02/2014

## 2014-12-02 NOTE — Progress Notes (Signed)
D: Patient is up in day room. Patient has refused scheduled  A: Staff to monitor Q 15 mins for safety. Encouragement and support offered. Scheduled medications administered per orders. R: Patient remains safe on the unit. Patient visible on the unit. Patient taking administered medications.

## 2014-12-02 NOTE — Progress Notes (Signed)
[facility] MD Progress Note  12/02/2014  Holly Hines  MRN:  [id number] Subjective:  Pt states: " I want to go home.'   Objective:  Patient is a 44 yr old AAF who presented disorganized, delusional . Pt was also found to have ectopic pregnancy in ED- is S/P EXPLORATORY LAPAROTOMY, Right Salpingectomy with Removal Ectopic Pregnancy . Pt seen and chart reviewed. Pt continues to be on 1:1 for disorganized behavior . Pt today continues to be unable to provide sufficient details about her hx or how she ended up in the hospital.  Pt reports having a daughter who lives with her aunt , but unable to give other details . Pt also appears to be smiling inappropriately , appears to be responding to internal stimuli. Pt also with psychomotor retardation , is paranoid , delusional - states she was being poisoned by a friend outside the hospital  And that she continues to feel that she cannot trust any one , since any one here could do the same to her . Pt denies AH/VH/SI/HI. Pt however was unable to sit through the entire evaluation and got up and left the room before the end of the session. Patient with severe paranoia , which could be the reason for her current behavior. Pt denies any abdominal pain /bleeding today. Pt with down trending Hb/HCT - will repeat CBC with diff. Per staff pt has been compliant on mkedications.    Principal Problem: Paranoid schizophrenia Diagnosis:   Patient Active Problem List   Diagnosis Date Noted  . Schizophrenia, paranoid type [F20.0] 11/29/2014  . Hemoperitoneum due to rupture of right tubal ectopic pregnancy [O00.9, K66.1] 11/28/2014  . tubal ectopic pregnancy unruptured, 12 wks [O00.1]   . Acute psychosis [F29]   . Paranoid schizophrenia [F20.0]   . Hypertension in pregnancy, antepartum [O16.9] 10/23/2012  . Schizophrenia, paranoid [F20.0] 10/10/2011   Total Time spent with patient: 25 minutes   Past Medical History:  Past Medical History  Diagnosis  Date  . Mental disorder   . Pregnant   . Hypertension     Past Surgical History  Procedure Laterality Date  . Cesarean section  9 yrs ago    Family History:Pt unable to provide family hx. Social History:  History  Alcohol Use: Not on file     History  Drug Use Not on file    History   Social History  . Marital Status: Single    Spouse Name: N/A  . Number of Children: N/A  . Years of Education: N/A   Social History Main Topics  . Smoking status: Current Some Day Smoker  . Smokeless tobacco: Not on file  . Alcohol Use: Not on file  . Drug Use: Not on file  . Sexual Activity: Yes    Birth Control/ Protection: None   Other Topics Concern  . Not on file   Social History Narrative   Additional History:    Sleep: Fair  Appetite:  Fair    Musculoskeletal: Strength & Muscle Tone: within normal limits Gait & Station: normal Patient leans: N/A   Psychiatric Specialty Exam: Physical Exam  Review of Systems  Psychiatric/Behavioral: The patient is nervous/anxious.   All other systems reviewed and are negative.   Blood pressure 153/97, pulse 56, temperature 97.7 F (36.5 C), temperature source Oral, resp. rate 16.There is no weight on file to calculate BMI.  General Appearance: Disheveled  Eye Solicitor::  Fair  Speech:  Normal Rate  Volume:  Normal  Mood:  Anxious  Affect:  Restricted  Thought Process:  Disorganized  Orientation:  Other:  to person , place, year  Thought Content:  Delusions and Paranoid Ideation pt feels she will be poisoned as she was poisoned outside [facility]  Suicidal Thoughts:  No  Homicidal Thoughts:  No  Memory:  Immediate;   Poor Recent;   Poor Remote;   Poor  Judgement:  Poor  Insight:  Lacking  Psychomotor Activity:  Decreased  Concentration:  Fair  Recall:  [employer] of Knowledge:Fair  Language: Fair  Akathisia:  No    AIMS (if indicated):     Assets:  Others:  access to health care  ADL's:  Intact  Cognition: Impaired,   Mild  Sleep:  Number of Hours: 5     Current Medications: Current Facility-Administered Medications  Medication Dose Route Frequency Provider Last Rate Last Dose  . acetaminophen (TYLENOL) tablet 650 mg  650 mg Oral Q6H PRN Caprice Kluver, MD   650 mg at 12/02/14 0352  . alum & mag hydroxide-simeth (MAALOX/MYLANTA) 200-200-20 MG/5ML suspension 30 mL  30 mL Oral Q4H PRN Vinay Genia Harold, MD      . benztropine (COGENTIN) tablet 0.5 mg  0.5 mg Oral BH-qamhs Balinda Heacock, MD      . haloperidol lactate (HALDOL) injection 5 mg  5 mg Intramuscular Q6H PRN Caprice Kluver, MD       Or  . haloperidol (HALDOL) tablet 5 mg  5 mg Oral Q6H PRN Caprice Kluver, MD   5 mg at 12/02/14 1208  . ibuprofen (ADVIL,MOTRIN) tablet 800 mg  800 mg Oral TID Jomarie Longs, MD   800 mg at 12/02/14 0807  . LORazepam (ATIVAN) tablet 2 mg  2 mg Oral Q6H PRN Caprice Kluver, MD   2 mg at 12/02/14 1208   Or  . LORazepam (ATIVAN) injection 2 mg  2 mg Intramuscular Q6H PRN Caprice Kluver, MD      . magnesium hydroxide (MILK OF MAGNESIA) suspension 30 mL  30 mL Oral Daily PRN Caprice Kluver, MD      . oxyCODONE-acetaminophen (PERCOCET/ROXICET) 5-325 MG per tablet 1-2 tablet  1-2 tablet Oral Q4H PRN Caprice Kluver, MD   1 tablet at 12/01/14 1700  . polyethylene glycol (MIRALAX / GLYCOLAX) packet 17 g  17 g Oral BID Jomarie Longs, MD   17 g at 12/02/14 0807  . potassium chloride SA (K-DUR,KLOR-CON) CR tablet 20 mEq  20 mEq Oral TID Beau Fanny, FNP   20 mEq at 12/02/14 0945  . [START ON 12/03/2014] risperiDONE (RISPERDAL) tablet 1 mg  1 mg Oral Daily Danyele Smejkal, MD      . risperiDONE (RISPERDAL) tablet 2 mg  2 mg Oral QHS Jomarie Longs, MD        Lab Results:  Results for orders placed or performed during the hospital encounter of 11/29/14 (from the past 48 hour(s))  CBC with Differential/Platelet     Status: Abnormal   Collection Time: 12/01/14  6:30 AM  Result Value Ref Range   WBC 6.8 4.0 - 10.5 K/uL     Comment: WHITE COUNT CONFIRMED ON SMEAR   RBC 2.72 (L) 3.87 - 5.11 MIL/uL   Hemoglobin 8.3 (L) 12.0 - 15.0 g/dL   HCT 16.1 (L) 09.6 - 04.5 %   MCV 89.3 78.0 - 100.0 fL   MCH 30.5 26.0 - 34.0 pg   MCHC 34.2 30.0 - 36.0 g/dL   RDW  13.5 11.5 - 15.5 %   Platelets 257 150 - 400 K/uL    Comment: LARGE PLATELETS PRESENT   Neutrophils Relative % 48 43 - 77 %   Neutro Abs 3.2 1.7 - 7.7 K/uL   Lymphocytes Relative 37 12 - 46 %   Lymphs Abs 2.5 0.7 - 4.0 K/uL   Monocytes Relative 7 3 - 12 %   Monocytes Absolute 0.4 0.1 - 1.0 K/uL   Eosinophils Relative 8 (H) 0 - 5 %   Eosinophils Absolute 0.5 0.0 - 0.7 K/uL   Basophils Relative 1 0 - 1 %   Basophils Absolute 0.1 0.0 - 0.1 K/uL    Comment: Performed at [facility]    Physical Findings: AIMS:  , ,  ,  ,    CIWA:    COWS:     Treatment Plan Summary: Paranoid schizophrenia  Patient continues to be paranoid , delusional , limited historian - will continue 1;1 precaution for safety reasons as well as continue treatment. Plan to increase Risperdal. Daily contact with patient to assess and evaluate symptoms and progress in treatment and Medication management   Medications: -Continue Haldol 5mg  PO/IM q6h prn agitation  -Continue Ativan 2mg  q6h prn severe anxiety -Continue Percocet 5-325 one-two tabs q4h prn pain from surgery -Will increase to  risperidone 1mg  po QAM - begin tomorrow AM , increase to 2 mg po qhs tonight for mood stabilization/ psychosis. -Will add Cogenitn 0.5 mg po bid for EPS. - Will continue to monitor for abdominal pain - pt denies pain today. Pt also denies bleeding. HB/HCT - bhh PM today. -Ibuprofen 800mg  liquid (pt refuses pills) PO tid x 2 days to reduce bleeding secondary to inflammatory processes from a gynecological perspective; if pt continues to saturate >3 pads in 3 hours, send to ED immediately. -Continue Kdur 20 meq as scheduled for hypokalemia . Will get BMP.  Medical Decision Making:  New  problem, with additional work up planned, Review of Psycho-Social Stressors (1), Review or order clinical lab tests (1), Review of Last Therapy Session (1), Review or order medicine tests (1), Review of Medication Regimen & Side Effects (2) and Review of New Medication or Change in Dosage (2)  Sumner Boesch,SarammaMD  12/02/2014, 2:11 PM

## 2014-12-02 NOTE — Progress Notes (Signed)
D: Patient in his room on approach.  Patient irritable with staff and is answering question with shaking her head yes and no. Patient guarded and isolative. Patient denies SI/HI and denies AVH.   A: Staff to monitor Q 15 mins for safety.  Encouragement and support offered.  Scheduled medications administered per orders. R: Patient remains safe on the unit.  Patient attended group tonight.  Patient visible on the unit and interacting with peers.  Patient taking administered medications.

## 2014-12-02 NOTE — Progress Notes (Deleted)
D: Pt took a nap earlier this shift. Showered and changed gown. Refused scheduled Potassium when offered.  A: Continued support and encouragement offered. Verbal encouragement provided to pt to voice concerns and to attend to ADL needs. EKG done and was tolerated well by pt. 1:1 level of observation remains effective as ordered for safety. R: Pt receptive to care. Denies adverse drug reactions. Remains safe on unit.            

## 2014-12-02 NOTE — [facility] Group Notes (Signed)
[facility] LCSW Group Therapy  12/02/2014 1:15 pm  Type of Therapy: Process Group Therapy  Participation Level:  Active  Participation Quality:  Appropriate  Affect:  Flat  Cognitive:  Oriented  Insight:  Improving  Engagement in Group:  Limited  Engagement in Therapy:  Limited  Modes of Intervention:  Activity, Clarification, Education, Problem-solving and Support  Summary of Progress/Problems: Today's group addressed the issue of overcoming obstacles.  Patients were asked to identify their biggest obstacle post d/c that stands in the way of their on-going success, and then problem solve as to how to manage this.  Came to group briefly.  Sat quietly.  When asked about how she deals with worry, replied that she has not really found anything that is effective.  Admitted to worrying a lot.  "The list goes on and on."  Discounted the examples others gave of what helps them.  Left after 10 minutes and did not return.  Ida Rogue 12/02/2014   3:33 PM

## 2014-12-02 NOTE — Progress Notes (Signed)
D: Patient in bed resting with her eyes closed. No signs or symptoms of distressed.  A: Staff to monitor Q 15 mins for safety.Encouraged fluids as tolerated. R: Patient remains safe on the unit. Encouraged patient to walk. Remains on 1:1

## 2014-12-02 NOTE — Plan of Care (Signed)
Problem: Alteration in thought process Goal: LTG-Patient has not harmed self or others in at least 2 days Outcome: Progressing Pt maintained on 1:1 level of observation as ordered for safety. No gestures or event of self injurious behavior or outburst to note at this time. Pt remains labile and impulsive but complies with medication with encouragement.

## 2014-12-02 NOTE — Progress Notes (Signed)
D: Pt awake in hall with assigned 1:1 staff in attendance at all times. Pt attended scheduled class this afternoon. A: PRN Ativan and Haldol administered as ordered for visible anxiety and agitation aeb angry affect, pacing in hall and demanding behaviors. Continued support and encouragement offered. [employer] checked for d/c papers from [facility] as per pt's request but writer could not locate papers and pt was informed.  1:1 level of observation maintained as ordered for safety.  R: Pt refused scheduled 1200 noon dose of Motrin as ordered when offered but took her PRN meds (see emar). Pt calm thus far when reassessed. Remains safe on unit.

## 2014-12-02 NOTE — [facility] Group Notes (Signed)
[facility] LCSW Aftercare Discharge Planning Group Note   12/02/2014 11:59 AM  Participation Quality:  Invited.  In bed, awake.  Declined to come.    Holly Hines B

## 2014-12-02 NOTE — Progress Notes (Signed)
D: Pt awake in bed at this time. Presents with flat affect and depressed / irritable mood. Denies SI, HI, AVH and pain when assessed, stated "NO". Pt remains guarded, isolative with poor eye contact and elective mutism. Minimal interactions noted with staff on as need basis. A: Verbal education done on ordered medications prior to administration. Availability and emotional support offered. Pt encouraged to voice needs and concerns. 1:1 level of observation maintained for safety as ordered.  R: Pt compliant with ordered medications when offered thought apprehensive on approach. Denied adverse drug reactions when assessed. Remains safe on unit.

## 2014-12-03 LAB — CBC WITH DIFFERENTIAL/PLATELET
Basophils Absolute: 0.1 10*3/uL (ref 0.0–0.1)
Basophils Relative: 1 % (ref 0–1)
Eosinophils Absolute: 0.3 10*3/uL (ref 0.0–0.7)
Eosinophils Relative: 6 % — ABNORMAL HIGH (ref 0–5)
HEMATOCRIT: 27.1 % — AB (ref 36.0–46.0)
Hemoglobin: 9.1 g/dL — ABNORMAL LOW (ref 12.0–15.0)
LYMPHS PCT: 41 % (ref 12–46)
Lymphs Abs: 2.3 10*3/uL (ref 0.7–4.0)
MCH: 30.8 pg (ref 26.0–34.0)
MCHC: 33.6 g/dL (ref 30.0–36.0)
MCV: 91.9 fL (ref 78.0–100.0)
MONO ABS: 0.4 10*3/uL (ref 0.1–1.0)
Monocytes Relative: 7 % (ref 3–12)
NEUTROS ABS: 2.5 10*3/uL (ref 1.7–7.7)
Neutrophils Relative %: 45 % (ref 43–77)
PLATELETS: 362 10*3/uL (ref 150–400)
RBC: 2.95 MIL/uL — ABNORMAL LOW (ref 3.87–5.11)
RDW: 13.8 % (ref 11.5–15.5)
WBC: 5.6 10*3/uL (ref 4.0–10.5)

## 2014-12-03 LAB — BASIC METABOLIC PANEL
Anion gap: 8 (ref 5–15)
BUN: 16 mg/dL (ref 6–20)
CHLORIDE: 106 mmol/L (ref 101–111)
CO2: 25 mmol/L (ref 22–32)
Calcium: 8.7 mg/dL — ABNORMAL LOW (ref 8.9–10.3)
Creatinine, Ser: 0.81 mg/dL (ref 0.44–1.00)
GFR calc non Af Amer: >60 mL/min (ref >60)
GLUCOSE: 80 mg/dL (ref 65–99)
Potassium: 3.8 mmol/L (ref 3.5–5.1)
Sodium: 139 mmol/L (ref 135–145)

## 2014-12-03 MED ORDER — ACETAMINOPHEN 325 MG PO TABS
650.0000 mg | ORAL_TABLET | Freq: Four times a day (QID) | ORAL | Status: DC | PRN
  Administered 2014-12-03 – 2014-12-05 (×3): 650 mg via ORAL
  Filled 2014-12-03 (×3): qty 2

## 2014-12-03 MED ORDER — RISPERIDONE 3 MG PO TABS
3.0000 mg | ORAL_TABLET | Freq: Every day | ORAL | Status: DC
  Filled 2014-12-03 (×3): qty 1

## 2014-12-03 NOTE — Progress Notes (Signed)
Patient remains 1:1 for safety  D: Patient resting in bed with eyes closed.  Respirations even and unlabored.  Patient appears to be in no apparent distress. A: Staff to monitor Q 15 mins for safety.   R:Patient remains safe on the unit.'

## 2014-12-03 NOTE — Progress Notes (Signed)
1:1 note D: Patient resting in bed with eyes closed.  Respirations even and unlabored.  Patient appears to be in no apparent distress. A: Staff to monitor Q 15 mins for safety.  Patient remains on 1:1 for safety. R:Patient remains safe on the unit.  

## 2014-12-03 NOTE — [facility] Group Notes (Signed)
The focus of this group is to educate the patient on the purpose and policies of crisis stabilization and provide a format to answer questions about their admission.  The group details unit policies and expectations of patients while admitted.  Patient did not attend 0900 nurse education orientation group this morning.  Patient was talking to  MD. 

## 2014-12-03 NOTE — [facility] Group Notes (Signed)
[facility] LCSW Group Therapy  12/03/2014 , 2:56 PM   Type of Therapy:  Group Therapy  Participation Level:  Active  Participation Quality:  Attentive  Affect:  Appropriate  Cognitive:  Alert  Insight:  Improving  Engagement in Therapy:  Engaged  Modes of Intervention:  Discussion, Exploration and Socialization  Summary of Progress/Problems: Today's group focused on the term Diagnosis.  Participants were asked to define the term, and then pronounce whether it is a negative, positive or neutral term.  Pt stayed until close to the end of group.  She sat quietly, looking down, and was reluctant to engage about the topic at hand.  However, she was willing to talk a bit about her situation when another patient talked about needing to find a job.  Holly Hines stated she needed help finding a job as well, "because you can't pay rent and other bills with out a job."  She admitted she has not worked for years, and I suggested that I could offer her a referral to [employer].  She initially agreed, but a little later decided she did not want that.  She stated she has applied for SSI, and has been waiting to hear back about approval or not after her hearing.  I offered a referral to [facility] so she has support in her application.  She declined.  I offered referral to [employer] and [employer].  She declined both, stating that her experience there is that "everyone is up in your business."  Holly Hines 12/03/2014 , 2:56 PM

## 2014-12-03 NOTE — Progress Notes (Signed)
D:Patient in her room on approach.  Patient mumbled when writer asked her questions.  Writer asked patient is she could visualized her sites where she had surgery.  Patient states, "It's all right I don't need you to look at it."  Patient flooded the bathroom tonight when she showered and was encouraged to ask for assistance. Patient was told what her medications would be for tonight patient states, "ok".  Patient was requesting forms from [facility] but Clinical research associate was not able to find the forms she was speaking of.  Patient does appear preoccupied all thought she denies preoccupation. Patient denies SI/HI and denies AVH. A: Staff to monitor Q 15 mins for safety.  Encouragement and support offered. No scheduled medications administered per orders because .   R: Patient remains safe on the unit.  Patient attended group tonight.  Patient visible on the unit and interacting with peers.  Patient refused medications tonight.

## 2014-12-03 NOTE — Progress Notes (Addendum)
0830  Patient has been sleeping this morning.  Patient did wake up and took all medications except miralax and potassium.  Patient denied SI & HI, contracts for safety.  Denied A/V hallucinations.  Patient stated I am not hearing voices and I want to go home today.  1:1 present for safety per MD orders.  Patient continues to lay in her bed.  Respirations even and unlabored.  No signs/symptoms of pain/distress noted on patient's face/body movements.  Safety maintained with 1:1 present.  0940  Patient's 1:1 canceled by MD.  Patient denied SI and HI, contracts for safety.   Denied A/V hallucinations. Respirations even and unlabored.  No signs/symptoms of pain/distress noted on patient's face/body movements.  Emotional support and encouragement given patient.  1140  Patient refused motrin this morning before lunch.  Stated she did not need this medication.  Denied SI and HI, contracts for safety.  Denied A/V hallucinations.  Denied pain.  Respirations even and unlabored.  No signs/symptoms of pain/distress noted on patient's face/body movements.  Safety maintained with 15 minute checks.  1340  Patient went to dining room for lunch.  Denied SI and HI, contracts for safety.  Denied A/V hallucinations.  Respirations even and unlabored.  No signs/symptoms of pain/distress noted on patient's face/body movements.  Safety maintained with 15 minute checks.  1540  Patient sitting in dayroom.  SW informed that patient would like to talk to him again.  Patient would not disclose information to RN.  Patient denied SI and HI, contracts for safety.  Denied A/V hallucinations.  Denied pain.  Respirations even and unlabored.  No signs/symptoms of pain/distress noted on patient's face/body movements.  Safety maintained with 15 minute checks.  1740  Patient has been sitting in dayroom and walking hallway.  Denied SI and HI, contracts for safety.  Denied A/V hallucinations.  Respirations even and unlabored.  No signs/symptoms of  pain/distress noted on patient's face/body movements.

## 2014-12-03 NOTE — Progress Notes (Signed)
Adult Psychoeducational Group Note  Date:  12/03/2014 Time:  8:33 PM  Group Topic/Focus:  Wrap-Up Group:   The focus of this group is to help patients review their daily goal of treatment and discuss progress on daily workbooks.  Participation Level:  Active  Participation Quality:  Appropriate  Affect:  Appropriate  Cognitive:  Appropriate  Insight: Appropriate  Engagement in Group:  Engaged  Modes of Intervention:  Discussion  Additional Comments: The patient expressed that she had a good day.The patient also said in group they discussed about their community.  Octavio Manns 12/03/2014, 8:33 PM

## 2014-12-03 NOTE — Progress Notes (Signed)
[facility] MD Progress Note  12/03/2014  Holly Hines  MRN:  [id number] Subjective:  Pt states: " When can I go home. I was on medications for schizophrenia in the past . But I stopped taking them.'   Objective:  Patient is a 44 yr old AAF who presented disorganized, delusional . Pt was also found to have ectopic pregnancy in ED- is S/P EXPLORATORY LAPAROTOMY, Right Salpingectomy with Removal Ectopic Pregnancy . Pt seen and chart reviewed.  Pt today seems to be more verbal , poor eye contact - focused on discharge. Pt reports a past hx of schizophrenia and being on several different medications before.Her answers are always short and she does not elaborate. Pt denies SI/AH/VH or paranoia -however objectively she appears to be paranoid . She attempted to attend group activities today, although left after a few minutes. Pt denies the need for an antidepressant or mood stabilizer. Pt denies any abdominal pain /bleeding today. Pt with down trending Hb/HCT - will repeat CBC with diff.She refused her labs but states she is willing to get it today - will reorder. Per staff pt has been compliant on mkedications. Pt today reports wanting to go to a shelter [place].   Principal Problem: Paranoid schizophrenia Diagnosis:   Patient Active Problem List   Diagnosis Date Noted  . Schizophrenia, paranoid type [F20.0] 11/29/2014  . Hemoperitoneum due to rupture of right tubal ectopic pregnancy [O00.9, K66.1] 11/28/2014  . tubal ectopic pregnancy unruptured, 12 wks [O00.1]   . Acute psychosis [F29]   . Paranoid schizophrenia [F20.0]   . Hypertension in pregnancy, antepartum [O16.9] 10/23/2012  . Schizophrenia, paranoid [F20.0] 10/10/2011   Total Time spent with patient: 25 minutes   Past Medical History:  Past Medical History  Diagnosis Date  . Mental disorder   . Pregnant   . Hypertension     Past Surgical History  Procedure Laterality Date  . Cesarean section  9 yrs ago    Family  History:Pt unable to provide family hx. Social History:  History  Alcohol Use: Not on file     History  Drug Use Not on file    History   Social History  . Marital Status: Single    Spouse Name: N/A  . Number of Children: N/A  . Years of Education: N/A   Social History Main Topics  . Smoking status: Current Some Day Smoker  . Smokeless tobacco: Not on file  . Alcohol Use: Not on file  . Drug Use: Not on file  . Sexual Activity: Yes    Birth Control/ Protection: None   Other Topics Concern  . Not on file   Social History Narrative   Additional History:    Sleep: Fair  Appetite:  Fair    Musculoskeletal: Strength & Muscle Tone: within normal limits Gait & Station: normal Patient leans: N/A   Psychiatric Specialty Exam: Physical Exam  Review of Systems  Psychiatric/Behavioral: The patient is nervous/anxious.   All other systems reviewed and are negative.   Blood pressure 127/93, pulse 97, temperature 97.9 F (36.6 C), temperature source Oral, resp. rate 16.There is no weight on file to calculate BMI.  General Appearance: Fairly Groomed  Patent attorney::  Fair  Speech:  Normal Rate  Volume:  Normal  Mood:  Anxious  Affect:  Restricted  Thought Process:  Disorganized improving  Orientation:  Full (Time, Place, and Person)  Thought Content:  Delusions and Paranoid Ideation   Suicidal Thoughts:  No  Homicidal Thoughts:  No  Memory:  Immediate;   Fair Recent;   Fair Remote;   Fair  Judgement:  Poor  Insight:  Lacking  Psychomotor Activity:  Decreased  Concentration:  Fair  Recall:  [employer] of Knowledge:Fair  Language: Fair  Akathisia:  No    AIMS (if indicated):     Assets:  Others:  access to health care  ADL's:  Intact  Cognition: Impaired,  Mild  Sleep:  Number of Hours: 6.75     Current Medications: Current Facility-Administered Medications  Medication Dose Route Frequency Provider Last Rate Last Dose  . acetaminophen (TYLENOL) tablet  650 mg  650 mg Oral Q6H PRN Caprice Kluver, MD   650 mg at 12/02/14 0352  . alum & mag hydroxide-simeth (MAALOX/MYLANTA) 200-200-20 MG/5ML suspension 30 mL  30 mL Oral Q4H PRN Caprice Kluver, MD      . benztropine (COGENTIN) tablet 0.5 mg  0.5 mg Oral BH-qamhs Kemani Heidel, MD   0.5 mg at 12/03/14 0835  . haloperidol lactate (HALDOL) injection 5 mg  5 mg Intramuscular Q6H PRN Caprice Kluver, MD       Or  . haloperidol (HALDOL) tablet 5 mg  5 mg Oral Q6H PRN Caprice Kluver, MD   5 mg at 12/02/14 1208  . ibuprofen (ADVIL,MOTRIN) tablet 800 mg  800 mg Oral TID Jomarie Longs, MD   800 mg at 12/03/14 0835  . LORazepam (ATIVAN) tablet 2 mg  2 mg Oral Q6H PRN Caprice Kluver, MD   2 mg at 12/02/14 1208   Or  . LORazepam (ATIVAN) injection 2 mg  2 mg Intramuscular Q6H PRN Caprice Kluver, MD      . magnesium hydroxide (MILK OF MAGNESIA) suspension 30 mL  30 mL Oral Daily PRN Caprice Kluver, MD      . oxyCODONE-acetaminophen (PERCOCET/ROXICET) 5-325 MG per tablet 1-2 tablet  1-2 tablet Oral Q4H PRN Caprice Kluver, MD   1 tablet at 12/01/14 1700  . polyethylene glycol (MIRALAX / GLYCOLAX) packet 17 g  17 g Oral BID Jomarie Longs, MD   17 g at 12/02/14 1720  . risperiDONE (RISPERDAL) tablet 1 mg  1 mg Oral Daily Jomarie Longs, MD   1 mg at 12/03/14 0836  . risperiDONE (RISPERDAL) tablet 3 mg  3 mg Oral QHS Eliska Hamil, MD        Lab Results:  No results found for this or any previous visit (from the past 48 hour(s)).  Physical Findings: AIMS:  , ,  ,  ,    CIWA:    COWS:     Treatment Plan Summary: Paranoid schizophrenia  Patient continues to be paranoid , delusional , limited historian - will continue 1;1 precaution for safety reasons as well as continue treatment. Plan to increase Risperdal. Daily contact with patient to assess and evaluate symptoms and progress in treatment and Medication management   Medications: -Continue Haldol  PO/IM q6h prn agitation  -Continue Ativan   q6h PO prn severe anxiety -DC Percocet. Continue Tylenol po prn for pain as scheduled. -Will increase to  risperidone  po QAM  , increase to 3 mg po qhs tonight for mood stabilization/ psychosis. -Will add Cogenitn 0.5 mg po bid for EPS. - Will continue to monitor for abdominal pain - pt denies pain today. Pt also denies bleeding. HB/HCT - bhh PM today. -Continue Kdur 20 meq as scheduled for hypokalemia . Will get BMP.  Medical Decision Making:  New problem, with additional work up planned, Review of Psycho-Social Stressors (1), Review or order clinical lab tests (1), Review of Last Therapy Session (1), Review or order medicine tests (1), Review of Medication Regimen & Side Effects (2) and Review of New Medication or Change in Dosage (2)  Bartt Gonzaga,SarammaMD  12/03/2014, 2:30 PM

## 2014-12-03 NOTE — Progress Notes (Signed)
Patient is refusing to have labs done.  Patient refused her PO potassium yesterday but is willing to eat bananas.  Writer explained the importance of both and patient states they already took her blood and she is not doing it again.

## 2014-12-04 MED ORDER — RISPERIDONE 1 MG PO TABS
3.5000 mg | ORAL_TABLET | Freq: Every day | ORAL | Status: DC
  Administered 2014-12-04: 3.5 mg via ORAL
  Filled 2014-12-04 (×2): qty 3.5

## 2014-12-04 MED ORDER — CALCIUM CARBONATE-VITAMIN D 500-200 MG-UNIT PO TABS
1.0000 | ORAL_TABLET | Freq: Every day | ORAL | Status: DC
  Filled 2014-12-04 (×2): qty 1

## 2014-12-04 MED ORDER — TAB-A-VITE/IRON PO TABS
1.0000 | ORAL_TABLET | Freq: Every day | ORAL | Status: DC
  Filled 2014-12-04 (×3): qty 1

## 2014-12-04 NOTE — Progress Notes (Signed)
[facility] MD Progress Note  12/04/2014  Marveen Donlon  MRN:  [id number] Subjective:  Pt states: " I want my papers to get discharged.'   Objective:  Patient is a 44 yr old AAF who presented disorganized, delusional . Pt was also found to have ectopic pregnancy in ED- is S/P EXPLORATORY LAPAROTOMY, Right Salpingectomy with Removal Ectopic Pregnancy . Pt seen and chart reviewed.  Pt today seems to be paranoid, suspicious , irritable, seen as withdrawn , not responding to questions being asked , refusing to take her medications stating that she does not need them. Pt does not respond even after a lot of encouragement , is focussed on getting discharge , adamant that she does not need any medications. Per staff pt is irritable, withdrawn , noncompliant on medications.  Pt to be encouraged to continue medications , continue to offer support.    Principal Problem: Paranoid schizophrenia Diagnosis:   Patient Active Problem List   Diagnosis Date Noted  . Schizophrenia, paranoid type [F20.0] 11/29/2014  . Hemoperitoneum due to rupture of right tubal ectopic pregnancy [O00.9, K66.1] 11/28/2014  . tubal ectopic pregnancy unruptured, 12 wks [O00.1]   . Acute psychosis [F29]   . Paranoid schizophrenia [F20.0]   . Hypertension in pregnancy, antepartum [O16.9] 10/23/2012  . Schizophrenia, paranoid [F20.0] 10/10/2011   Total Time spent with patient: 25 minutes   Past Medical History:  Past Medical History  Diagnosis Date  . Mental disorder   . Pregnant   . Hypertension     Past Surgical History  Procedure Laterality Date  . Cesarean section  9 yrs ago    Family History:Pt unable to provide family hx. Social History:  History  Alcohol Use: Not on file     History  Drug Use Not on file    Social History   Social History  . Marital Status: Single    Spouse Name: N/A  . Number of Children: N/A  . Years of Education: N/A   Social History Main Topics  . Smoking status:  Current Some Day Smoker  . Smokeless tobacco: Not on file  . Alcohol Use: Not on file  . Drug Use: Not on file  . Sexual Activity: Yes    Birth Control/ Protection: None   Other Topics Concern  . Not on file   Social History Narrative   Additional History:    Sleep: Fair  Appetite:  Fair    Musculoskeletal: Strength & Muscle Tone: within normal limits Gait & Station: normal Patient leans: N/A   Psychiatric Specialty Exam: Physical Exam  Review of Systems  Psychiatric/Behavioral: The patient is nervous/anxious.   All other systems reviewed and are negative.   Blood pressure 127/93, pulse 97, temperature 97.9 F (36.6 C), temperature source Oral, resp. rate 16.There is no weight on file to calculate BMI.  General Appearance: Fairly Groomed  Engineer, water::  Fair  Speech:  Normal Rate  Volume:  Normal  Mood:  Anxious and Irritable  Affect:  Restricted  Thought Process:  Disorganized   Orientation:  Full (Time, Place, and Person)  Thought Content:  Delusions and Paranoid Ideation   Suicidal Thoughts:  No  Homicidal Thoughts:  No  Memory:  Immediate;   Fair Recent;   Fair Remote;   Fair  Judgement:  Poor  Insight:  Lacking  Psychomotor Activity:  Decreased  Concentration:  Fair  Recall:  [place]  Language: Fair  Akathisia:  No    AIMS (if indicated):  Assets:  Others:  access to health care  ADL's:  Intact  Cognition: Impaired,  Mild  Sleep:  Number of Hours: 4     Current Medications: Current Facility-Administered Medications  Medication Dose Route Frequency Provider Last Rate Last Dose  . acetaminophen (TYLENOL) tablet 650 mg  650 mg Oral Q6H PRN Ursula Alert, MD   650 mg at 12/03/14 1729  . alum & mag hydroxide-simeth (MAALOX/MYLANTA) 200-200-20 MG/5ML suspension 30 mL  30 mL Oral Q4H PRN Skip Estimable, MD      . benztropine (COGENTIN) tablet 0.5 mg  0.5 mg Oral BH-qamhs Duyen Beckom, MD   0.5 mg at 12/03/14 0835  .  [START ON 12/05/2014] calcium-vitamin D (OSCAL WITH D) 500-200 MG-UNIT per tablet 1 tablet  1 tablet Oral Q breakfast Jayde Daffin, MD      . haloperidol lactate (HALDOL) injection 5 mg  5 mg Intramuscular Q6H PRN Skip Estimable, MD       Or  . haloperidol (HALDOL) tablet 5 mg  5 mg Oral Q6H PRN Skip Estimable, MD   5 mg at 12/02/14 1208  . LORazepam (ATIVAN) tablet 2 mg  2 mg Oral Q6H PRN Skip Estimable, MD   2 mg at 12/02/14 1208   Or  . LORazepam (ATIVAN) injection 2 mg  2 mg Intramuscular Q6H PRN Skip Estimable, MD      . magnesium hydroxide (MILK OF MAGNESIA) suspension 30 mL  30 mL Oral Daily PRN Skip Estimable, MD      . multivitamins with iron tablet 1 tablet  1 tablet Oral Daily Zaria Taha, MD      . polyethylene glycol (MIRALAX / GLYCOLAX) packet 17 g  17 g Oral BID Ursula Alert, MD   17 g at 12/02/14 1720  . risperiDONE (RISPERDAL) tablet 1 mg  1 mg Oral Daily Ursula Alert, MD   1 mg at 12/03/14 0836  . risperiDONE (RISPERDAL) tablet 3.5 mg  3.5 mg Oral QHS Ursula Alert, MD        Lab Results:  Results for orders placed or performed during the hospital encounter of 11/29/14 (from the past 48 hour(s))  CBC with Differential/Platelet     Status: Abnormal   Collection Time: 12/03/14  7:21 PM  Result Value Ref Range   WBC 5.6 4.0 - 10.5 K/uL    Comment: WHITE COUNT CONFIRMED ON SMEAR   RBC 2.95 (L) 3.87 - 5.11 MIL/uL   Hemoglobin 9.1 (L) 12.0 - 15.0 g/dL   HCT 27.1 (L) 36.0 - 46.0 %   MCV 91.9 78.0 - 100.0 fL   MCH 30.8 26.0 - 34.0 pg   MCHC 33.6 30.0 - 36.0 g/dL   RDW 13.8 11.5 - 15.5 %   Platelets 362 150 - 400 K/uL   Neutrophils Relative % 45 43 - 77 %   Lymphocytes Relative 41 12 - 46 %   Monocytes Relative 7 3 - 12 %   Eosinophils Relative 6 (H) 0 - 5 %   Basophils Relative 1 0 - 1 %   Neutro Abs 2.5 1.7 - 7.7 K/uL   Lymphs Abs 2.3 0.7 - 4.0 K/uL   Monocytes Absolute 0.4 0.1 - 1.0 K/uL   Eosinophils Absolute 0.3 0.0 - 0.7 K/uL   Basophils Absolute 0.1  0.0 - 0.1 K/uL   Smear Review MORPHOLOGY UNREMARKABLE     Comment: Performed at [place] metabolic panel     Status:  Abnormal   Collection Time: 12/03/14  7:21 PM  Result Value Ref Range   Sodium 139 135 - 145 mmol/L   Potassium 3.8 3.5 - 5.1 mmol/L   Chloride 106 101 - 111 mmol/L   CO2 25 22 - 32 mmol/L   Glucose, Bld 80 65 - 99 mg/dL   BUN 16 6 - 20 mg/dL   Creatinine, Ser 0.81 0.44 - 1.00 mg/dL   Calcium 8.7 (L) 8.9 - 10.3 mg/dL   GFR calc non Af Amer >60 >60 mL/min   GFR calc Af Amer >60 >60 mL/min    Comment: (NOTE) The eGFR has been calculated using the CKD EPI equation. This calculation has not been validated in all clinical situations. eGFR's persistently <60 mL/min signify possible Chronic Kidney Disease.    Anion gap 8 5 - 15    Comment: Performed at [facility]    Physical Findings: AIMS: Facial and Oral Movements Muscles of Facial Expression: None, normal Lips and Perioral Area: None, normal Jaw: None, normal Tongue: None, normal,Extremity Movements Upper (arms, wrists, hands, fingers): None, normal Lower (legs, knees, ankles, toes): None, normal, Trunk Movements Neck, shoulders, hips: None, normal, Overall Severity Severity of abnormal movements (highest score from questions above): None, normal Incapacitation due to abnormal movements: None, normal Patient's awareness of abnormal movements (rate only patient's report): No Awareness, Dental Status Current problems with teeth and/or dentures?: No Does patient usually wear dentures?: No  CIWA:  CIWA-Ar Total: 1 COWS:  COWS Total Score: 2  Treatment Plan Summary: Paranoid schizophrenia  Patient continues to be paranoid , delusional , limited historian - will continue treatment. Daily contact with patient to assess and evaluate symptoms and progress in treatment and Medication management   Medications: -Continue Haldol $RemoveBeforeDE'5mg'dhxKmJgCEHxXdNV$  PO/IM q6h prn agitation  -Continue  Ativan $Remov'2mg'PQUpDu$  q6h PO prn severe anxiety -DC Percocet. Continue Tylenol po prn for pain as scheduled. -Will increase to  risperidone $RemoveBefo'1mg'cBnOgTKBUed$  po QAM  , increase to 3.5 mg po qhs tonight for mood stabilization/ psychosis. -Will add Cogenitn 0.5 mg po bid for EPS. - Labs reviewed - Hb, HCT - improved . K+- 3.8.  Medical Decision Making:  New problem, with additional work up planned, Review of Psycho-Social Stressors (1), Review or order clinical lab tests (1), Review of Last Therapy Session (1), Review or order medicine tests (1), Review of Medication Regimen & Side Effects (2) and Review of New Medication or Change in Dosage (2)  Fenna Semel,SarammaMD  12/04/2014, 2:18PM

## 2014-12-04 NOTE — Progress Notes (Cosign Needed)
D) Pt has been sullen in mood and affect. Holly Hines is refusing all medications including vitamin and ibuprofen. Pt would not engage with writer or even make eye contact. Pt is seclusive to self and isolative to room. A) Level 3 obs for safety, support and encourage. Med ed reinforced. R) Guarded, irritable.

## 2014-12-04 NOTE — [facility] Group Notes (Signed)
[facility] LCSW Aftercare Discharge Planning Group Note   12/04/2014 12:59 PM  Participation Quality:  Patient was invited to attend group but declined. Liliana Cline

## 2014-12-04 NOTE — Progress Notes (Addendum)
D:Patient sitting in the hallway with a comb in her hair on approach.  Patient appears angry.  Patient does not engage in conversation with Clinical research associate.  Writer askes patient several questions and patient did not answer.  Patient did states she does have a headache and states tylenol is not helping but will not accept or try other offered interventions.  Patient denies SI/HI and denies AVH. A: Staff to monitor Q 15 mins for safety.  Encouragement and support offered.  Scheduled medications administered per orders.  Patient refused Cogentin. R: Patient remains safe on the unit.  Patient did not attend group tonight.  Patient visible on the unit but not interacting with anyone.  Patient appeared to take her medications but walked away from the window.

## 2014-12-04 NOTE — [facility] Group Notes (Signed)
.  mha ([employer] Speaker)-  [facility] LCSW Group Therapy 12/04/2014 1:15 PM Type of Therapy:? Group Therapy Participation Level:?Patient was invited to attend group but declined.   

## 2014-12-04 NOTE — Progress Notes (Signed)
Patient agreed to take her Risperdal tonight but refused her cogentin.Patient appeared to take the mediations but walked away from the med window.She sat in the dayroom for approx. 2 minutes and then walked to her room and flushed the toilet 3 times.Patient is angry and staff is unsure if she flushed the medication down the toilet.

## 2014-12-04 NOTE — Plan of Care (Signed)
Problem: Alteration in thought process Goal: LTG-Patient behavior demonstrates decreased signs psychosis (Patient behavior demonstrates decreased signs of psychosis to the point the patient is safe to return home and continue treatment in an outpatient setting.)  Outcome: Not Progressing Patient still paranoid and preoccupied and suspicious.   Goal: LTG-Patient verbalizes understanding importance med regimen (Patient verbalizes understanding of importance of medication regimen and need to continue outpatient care.)  Outcome: Not Progressing Patient still refusing medication even after rationale is given.

## 2014-12-05 MED ORDER — BENZTROPINE MESYLATE 0.5 MG PO TABS: 0.5000 mg | ORAL_TABLET | ORAL | Status: AC

## 2014-12-05 MED ORDER — RISPERIDONE 1 MG PO TABS: 3.5000 mg | ORAL_TABLET | Freq: Every day | ORAL | Status: AC

## 2014-12-05 MED ORDER — RISPERIDONE 1 MG PO TABS: 1.0000 mg | ORAL_TABLET | Freq: Every day | ORAL | Status: AC

## 2014-12-05 NOTE — [facility] Suicide Risk Assessment (Signed)
[facility] Discharge Suicide Risk Assessment   Demographic Factors:  Unemployed  Total Time spent with patient: 30 minutes  Musculoskeletal: Strength & Muscle Tone: within normal limits Gait & Station: normal Patient leans: N/A  Psychiatric Specialty Exam: Physical Exam  Review of Systems  Psychiatric/Behavioral: Negative for suicidal ideas. The patient is not nervous/anxious.   All other systems reviewed and are negative.   Blood pressure 135/95, pulse 89, temperature 98 F (36.7 C), temperature source Oral, resp. rate 17.There is no weight on file to calculate BMI.  General Appearance: Fairly Groomed  Patent attorney::  Fair  Speech:  Clear and Coherent409  Volume:  Normal  Mood:  LESS ANXIOUS  Affect:  Constricted  Thought Process:  Coherent  Orientation:  Full (Time, Place, and Person)  Thought Content:  WDL  Suicidal Thoughts:  No  Homicidal Thoughts:  No  Memory:  Immediate;   Fair Recent;   Fair Remote;   Fair  Judgement:  Fair  Insight:  Shallow  Psychomotor Activity:  Normal  Concentration:  Fair  Recall:  [employer] of Knowledge:Fair  Language: Fair  Akathisia:  No  Handed:  Right  AIMS (if indicated):     Assets:  Communication Skills  Sleep:  Number of Hours: 4  Cognition: WNL  ADL's:  Intact   Have you used any form of tobacco in the last 30 days? (Cigarettes, Smokeless Tobacco, Cigars, and/or Pipes): Patient Refused Screening  Has this patient used any form of tobacco in the last 30 days? (Cigarettes, Smokeless Tobacco, Cigars, and/or Pipes) No  Mental Status Per Nursing Assessment::   On Admission:  NA  Current Mental Status by Physician: PT DENIES SI/HI/AH/VH.PT DENIES ANY CONCERNS ABOUT HER ABDOMINAL WOUND- WOUND HEALING WELL- NO PAIN.  Loss Factors: NA  Historical Factors: Impulsivity  Risk Reduction Factors:   NA  Continued Clinical Symptoms:  Previous Psychiatric Diagnoses and Treatments  Cognitive Features That Contribute To Risk:   Polarized thinking    Suicide Risk:  Minimal: No identifiable suicidal ideation.  Patients presenting with no risk factors but with morbid ruminations; may be classified as minimal risk based on the severity of the depressive symptoms  Principal Problem: Paranoid schizophrenia Discharge Diagnoses:  Patient Active Problem List   Diagnosis Date Noted  . Schizophrenia, paranoid type [F20.0] 11/29/2014  . Hemoperitoneum due to rupture of right tubal ectopic pregnancy [O00.9, K66.1] 11/28/2014  . tubal ectopic pregnancy unruptured, 12 wks [O00.1]   . Acute psychosis [F29]   . Paranoid schizophrenia [F20.0]   . Hypertension in pregnancy, antepartum [O16.9] 10/23/2012  . Schizophrenia, paranoid [F20.0] 10/10/2011      Plan Of Care/Follow-up recommendations:  Activity:  No restrictions Diet:  regular Tests:  follow up with [facility] Other:  Patient needs to follow up with [facility]- S/P Laproscopic procedure for ectopic pregnancy as well as monitor BP.  Is patient on multiple antipsychotic therapies at discharge:  No   Has Patient had three or more failed trials of antipsychotic monotherapy by history:  No  Recommended Plan for Multiple Antipsychotic Therapies: NA    Travis Purk md 12/05/2014, 9:49 AM
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
## 2014-12-05 NOTE — [facility] Group Notes (Signed)
[facility] Group Notes:  (Nursing/MHT/Case Management/Adjunct)  Date:  12/05/2014  Time:  0915am  Type of Therapy:  Nurse Education  Participation Level:  Minimal  Participation Quality:  Inattentive  Affect:  Blunted  Cognitive:  Lacking  Insight:  None  Engagement in Group:  Poor  Modes of Intervention:  Discussion, Education and Support  Summary of Progress/Problems: Patient attended group, pt arrived late and left group early. Pt appeared distracted.   Holly Hines A 12/05/2014, 2:04 PM

## 2014-12-05 NOTE — Progress Notes (Signed)
  [facility] Adult Case Management Discharge Plan :  Will you be returning to the same living situation after discharge:  Yes,  [place] At discharge, do you have transportation home?: Yes,  bus pass Do you have the ability to pay for your medications: Yes,  MCD  Release of information consent forms completed and in the chart;  Patient's signature needed at discharge.  Patient to Follow up at: Follow-up Information    Follow up with [facility]    . Go in 1 day.   Why:  URGENT: Go TOMORROW/FRIDAY for evaluation of your post-surgical health   Contact information:   [address] [place] [address] [phone]      Follow up with [facility].   Why:  Contact them when the time is right for you    Contact information:   [address]  [address]  [place]  [phone]      Patient denies SI/HI: Yes,  yes    Safety Planning and Suicide Prevention discussed: Yes,  yes  Have you used any form of tobacco in the last 30 days? (Cigarettes, Smokeless Tobacco, Cigars, and/or Pipes): Patient Refused Screening  Has patient been referred to the Quitline?: Patient refused referral  Ida Rogue 12/05/2014, 10:34 AM

## 2014-12-05 NOTE — Progress Notes (Signed)
D: Pt alert and oriented X 4. Presents with irritable mood and flat /sad affect. Pt d/c as per MD's order. Bus pass given for transportation.  A: Verbal education done on ordered medications prior to administration. D/C instructions including medications, prescriptions and outside appointments (medical & mental health) was reviewed with pt. Vitals done and WNL.  All belongings in locker 21, medication samples and prescriptions was given to pt at time of departure. Continued support and availability offered. Pt encouraged to voice needs and concerns. Safety maintained on Q 15 minutes checks till time of d/c. A: Pt denied SI, HI, AVH and pain when assessed. Pt compliant with medications on 2nd attempt when offered. Pt verbalized understanding of d/c instructions upon review. Pt signed her belonging sheet in agreement with items received. Pt ambulatory with steady gait, appears to be in no physical distress at time of d/c from facility. Safety maintained on and off unit till time of d/c.

## 2014-12-05 NOTE — [facility] Suicide Risk Assessment (Signed)
[facility] INPATIENT:  Family/Significant Other Suicide Prevention Education  Suicide Prevention Education:  Patient Refusal for Family/Significant Other Suicide Prevention Education: The patient Holly Hines has refused to provide written consent for family/significant other to be provided Family/Significant Other Suicide Prevention Education during admission and/or prior to discharge.  Physician notified.  Daryel Gerald B 12/05/2014, 10:32 AM
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
## 2014-12-06 ENCOUNTER — Encounter (HOSPITAL_COMMUNITY): Payer: Self-pay | Admitting: Obstetrics and Gynecology

## 2015-01-24 ENCOUNTER — Emergency Department (HOSPITAL_COMMUNITY)
Admission: EM | Admit: 2015-01-24 | Discharge: 2015-01-24 | Discharge status: Against Medical Advice | Payer: Medicaid Other | Attending: Emergency Medicine | Admitting: Emergency Medicine

## 2015-01-24 ENCOUNTER — Encounter (HOSPITAL_COMMUNITY): Payer: Self-pay | Admitting: Emergency Medicine

## 2015-01-24 DIAGNOSIS — Z202 Contact with and (suspected) exposure to infections with a predominantly sexual mode of transmission: Secondary | ICD-10-CM | POA: Insufficient documentation

## 2015-01-24 DIAGNOSIS — Z720 Tobacco use: Secondary | ICD-10-CM | POA: Diagnosis not present

## 2015-01-24 DIAGNOSIS — Z711 Person with feared health complaint in whom no diagnosis is made: Secondary | ICD-10-CM

## 2015-01-24 DIAGNOSIS — I10 Essential (primary) hypertension: Secondary | ICD-10-CM | POA: Diagnosis not present

## 2015-01-24 NOTE — ED Notes (Signed)
Pt concerned that she has STD due to pimple on buttocks. Pt denies vaginal discharge/ denies problems urinating.

## 2015-01-24 NOTE — ED Notes (Signed)
Pt left AMA. Pt would not speak to RN- pt walked out through waiting room.

## 2015-01-31 ENCOUNTER — Inpatient Hospital Stay: Payer: Self-pay | Admitting: Family Medicine

## 2017-06-27 IMAGING — US US OB COMP LESS 14 WK
1 series · 14 of 28 positions shown · non-contrast
Comparison: None for this pregnancy

CLINICAL DATA: Positive pregnancy test, unsure dates

EXAM:
OBSTETRIC <14 WK ULTRASOUND
TECHNIQUE: Transabdominal ultrasound was performed for evaluation of the
gestation as well as the maternal uterus and adnexal regions.

[Series 1: us ob comp less 14 wk · 0.21mm/px · 44 acquisitions, 14 frames shown]
[im 2/44]
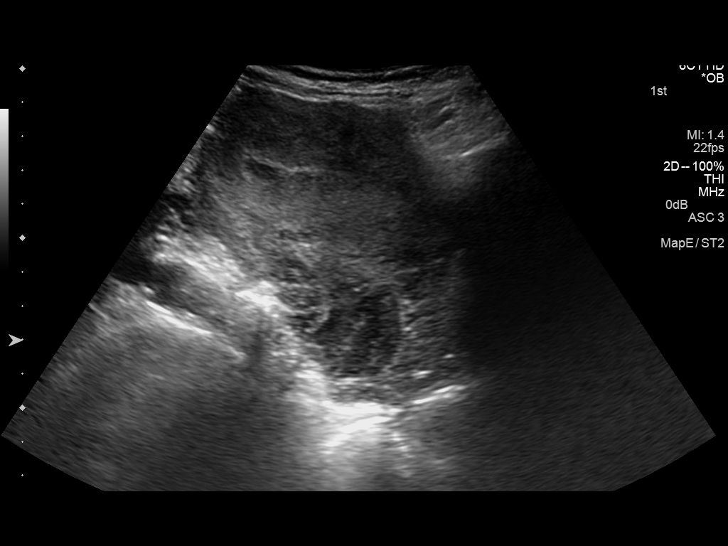
[im 5/44]
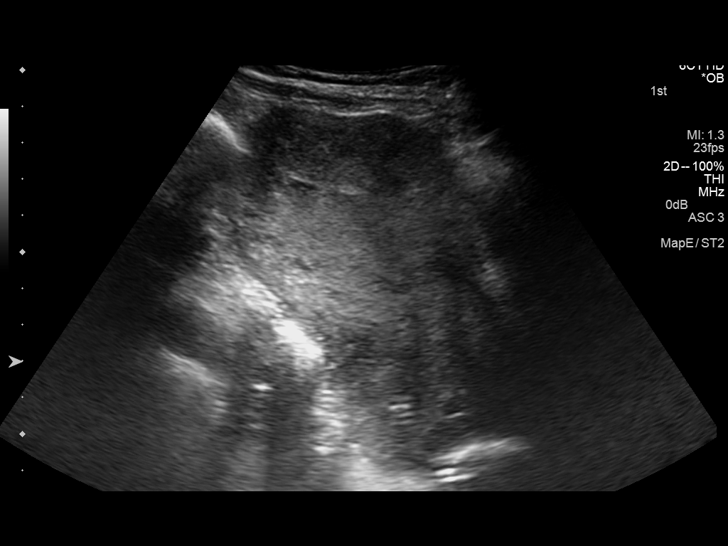
[im 8/44]
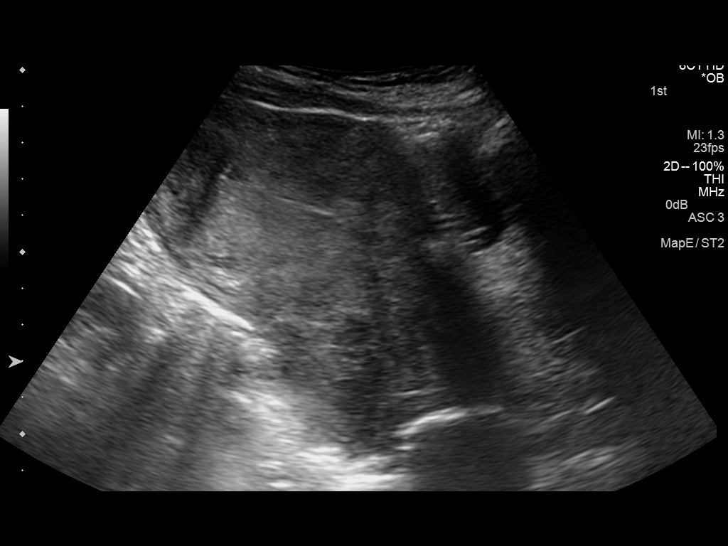
[im 12/44]
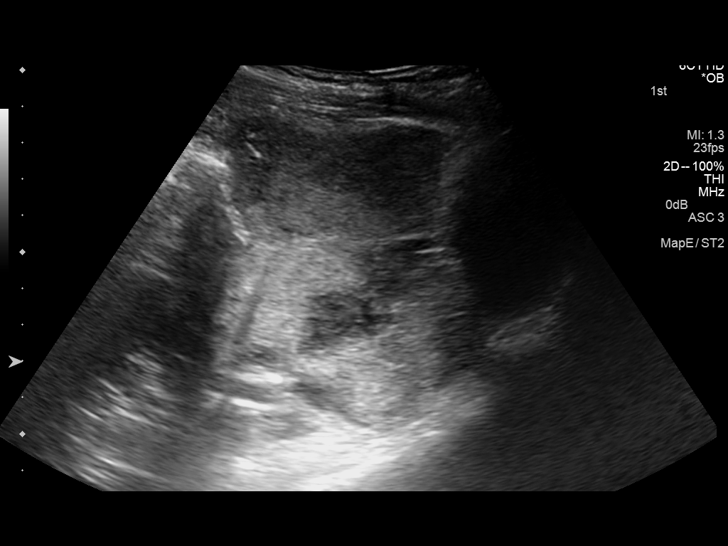
[im 15/44]
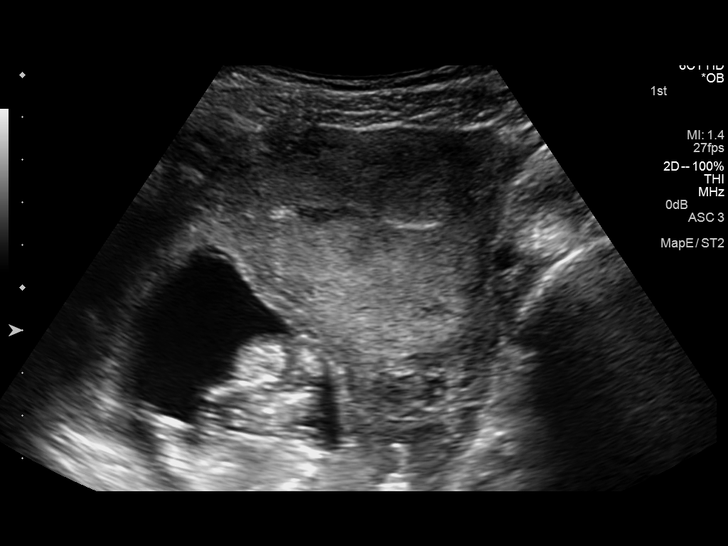
[im 18/44]
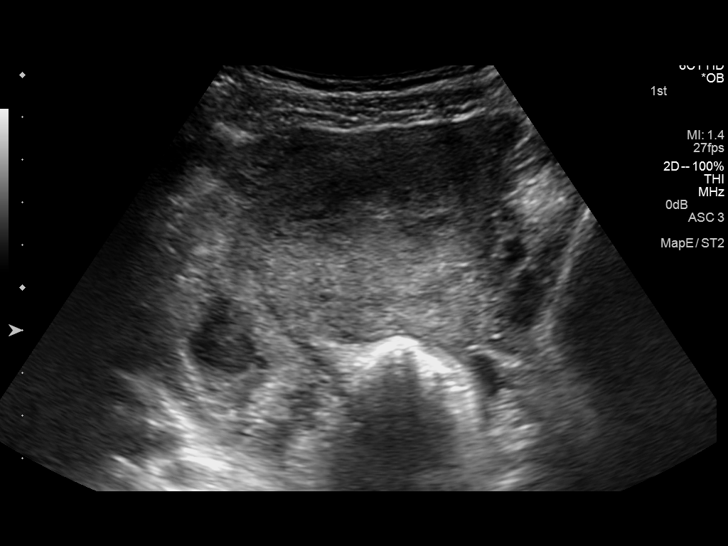
[im 21/44]
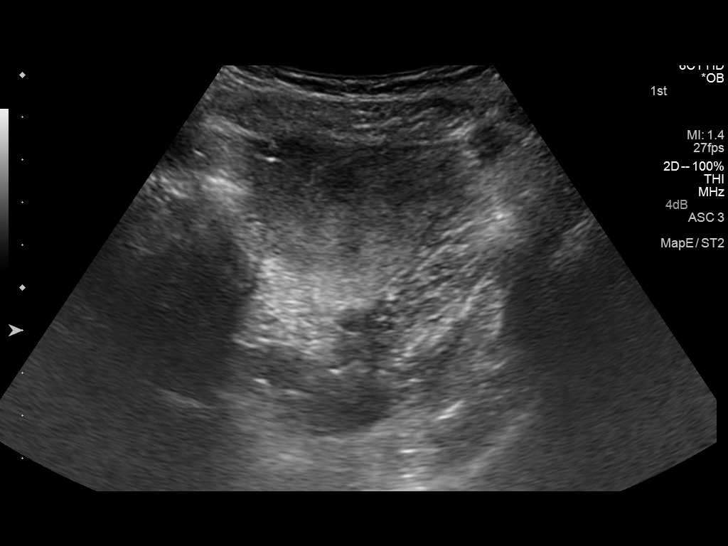
[im 24/44]
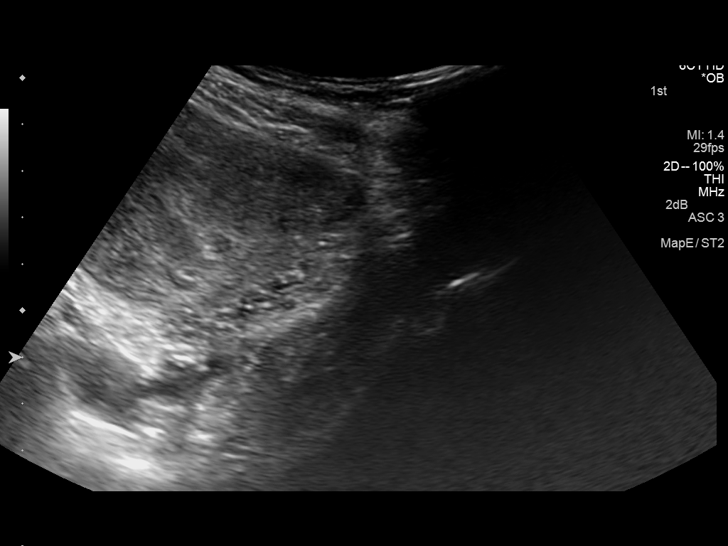
[im 28/44]
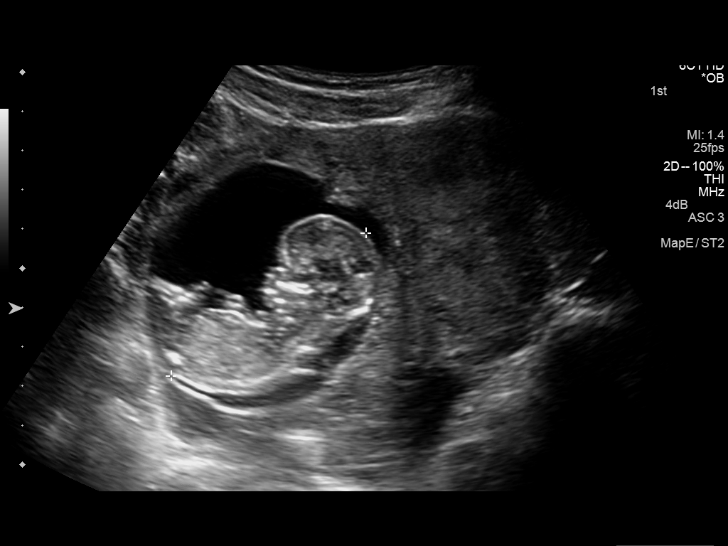
[im 31/44]
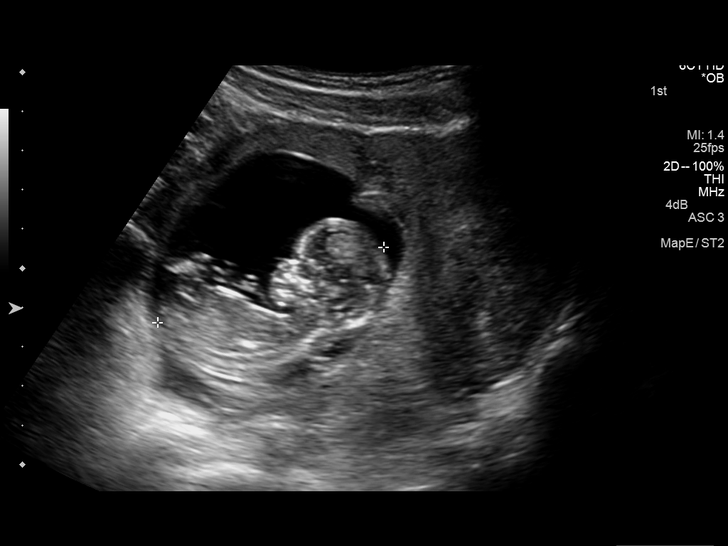
[im 34/44]
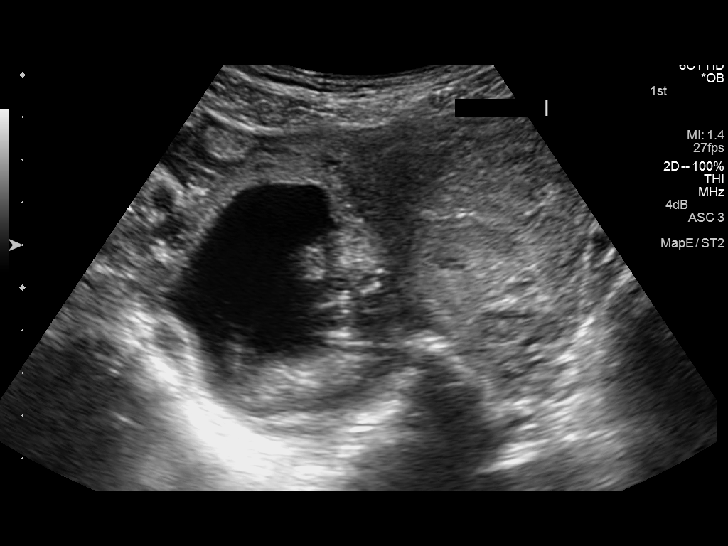
[im 37/44]
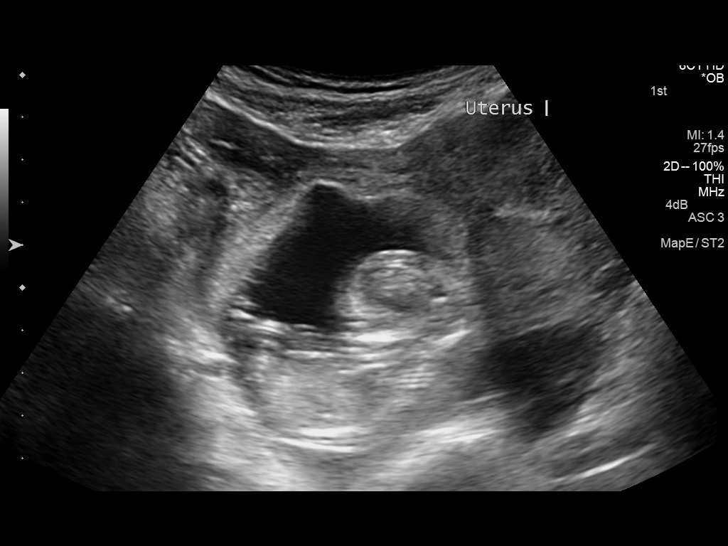
[im 40/44]
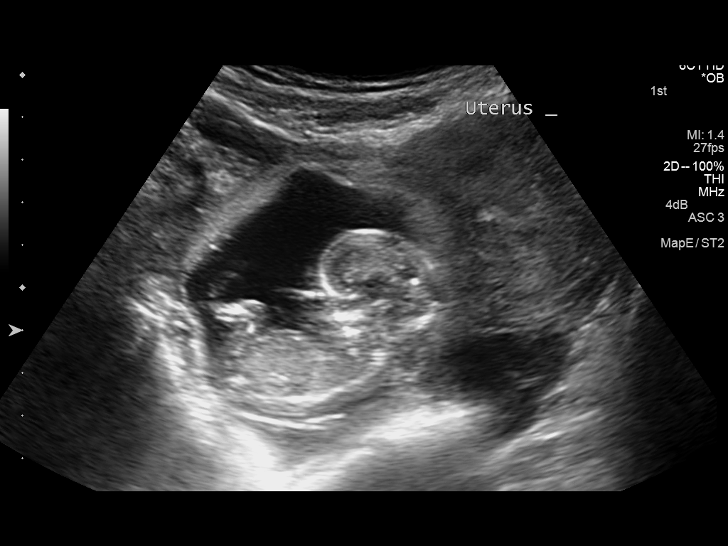
[im 44/44]
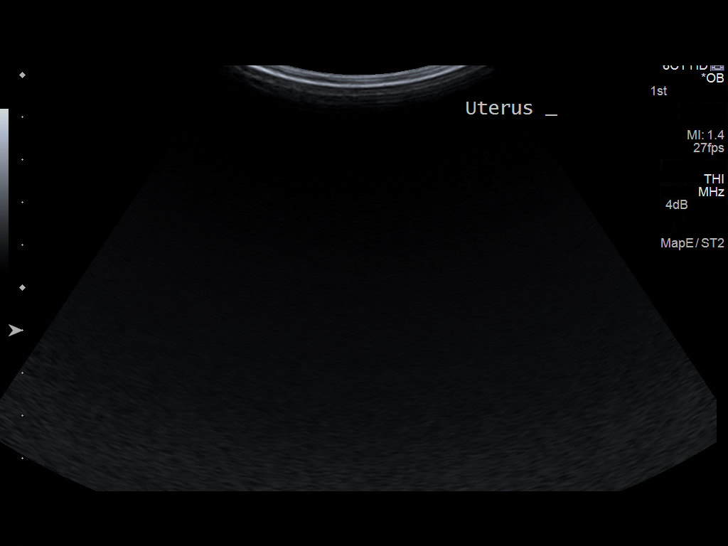

[14 of 28 positions shown; findings below may reference images not displayed]

FINDINGS: Intrauterine gestational sac: Not visualized

Within the right adnexa is a gestational sac with internal embryo
with cardiac activity, fetal heart rate 152 beats per minute.

CRL:   60  mm   12 w 3d                  US EDC: 06/08/15

Maternal uterus/adnexae: Ovaries could not be visualized as the
patient requested the exam be terminated and no further imaging
could be performed. Trace free fluid noted in the cul de sac.
IMPRESSION: 12 week 3 day live right adnexal ectopic pregnancy. No intrauterine
gestational sac identified.

Critical Value/emergent results were called by telephone at the time
of interpretation on 11/27/2014 at [DATE] to Dr. Ernst, who verbally
acknowledged these results.

## 2019-04-17 ENCOUNTER — Emergency Department (HOSPITAL_COMMUNITY)
Admission: EM | Admit: 2019-04-17 | Discharge: 2019-04-18 | Disposition: A | Discharge status: Home / Self Care | Payer: Self-pay | Attending: Emergency Medicine | Admitting: Emergency Medicine

## 2019-04-17 ENCOUNTER — Encounter (HOSPITAL_COMMUNITY): Payer: Self-pay

## 2019-04-17 ENCOUNTER — Other Ambulatory Visit: Payer: Self-pay

## 2019-04-17 DIAGNOSIS — I10 Essential (primary) hypertension: Secondary | ICD-10-CM | POA: Insufficient documentation

## 2019-04-17 DIAGNOSIS — U071 COVID-19: Secondary | ICD-10-CM | POA: Insufficient documentation

## 2019-04-17 DIAGNOSIS — Z20828 Contact with and (suspected) exposure to other viral communicable diseases: Secondary | ICD-10-CM | POA: Insufficient documentation

## 2019-04-17 DIAGNOSIS — F200 Paranoid schizophrenia: Secondary | ICD-10-CM | POA: Diagnosis present

## 2019-04-17 DIAGNOSIS — F4323 Adjustment disorder with mixed anxiety and depressed mood: Secondary | ICD-10-CM

## 2019-04-17 DIAGNOSIS — F22 Delusional disorders: Secondary | ICD-10-CM

## 2019-04-17 DIAGNOSIS — F17200 Nicotine dependence, unspecified, uncomplicated: Secondary | ICD-10-CM | POA: Insufficient documentation

## 2019-04-17 DIAGNOSIS — Z79899 Other long term (current) drug therapy: Secondary | ICD-10-CM | POA: Insufficient documentation

## 2019-04-17 LAB — RAPID URINE DRUG SCREEN, HOSP PERFORMED
Amphetamines: NOT DETECTED
Barbiturates: NOT DETECTED
Benzodiazepines: NOT DETECTED
Cocaine: NOT DETECTED
Opiates: NOT DETECTED
Tetrahydrocannabinol: NOT DETECTED

## 2019-04-17 LAB — CBC WITH DIFFERENTIAL/PLATELET
Abs Immature Granulocytes: 0.03 10*3/uL (ref 0.00–0.07)
Basophils Absolute: 0 10*3/uL (ref 0.0–0.1)
Basophils Relative: 0 %
Eosinophils Absolute: 0.2 10*3/uL (ref 0.0–0.5)
Eosinophils Relative: 2 %
HCT: 35.4 % — ABNORMAL LOW (ref 36.0–46.0)
Hemoglobin: 11.3 g/dL — ABNORMAL LOW (ref 12.0–15.0)
Immature Granulocytes: 0 %
Lymphocytes Relative: 30 %
Lymphs Abs: 2.8 10*3/uL (ref 0.7–4.0)
MCH: 29 pg (ref 26.0–34.0)
MCHC: 31.9 g/dL (ref 30.0–36.0)
MCV: 90.8 fL (ref 80.0–100.0)
Monocytes Absolute: 0.7 10*3/uL (ref 0.1–1.0)
Monocytes Relative: 7 %
Neutro Abs: 5.5 10*3/uL (ref 1.7–7.7)
Neutrophils Relative %: 61 %
Platelets: 374 10*3/uL (ref 150–400)
RBC: 3.9 MIL/uL (ref 3.87–5.11)
RDW: 13.4 % (ref 11.5–15.5)
WBC: 9.2 10*3/uL (ref 4.0–10.5)
nRBC: 0 % (ref 0.0–0.2)

## 2019-04-17 LAB — COMPREHENSIVE METABOLIC PANEL
ALT: 14 U/L (ref 0–44)
AST: 17 U/L (ref 15–41)
Albumin: 4 g/dL (ref 3.5–5.0)
Alkaline Phosphatase: 68 U/L (ref 38–126)
Anion gap: 9 (ref 5–15)
BUN: 17 mg/dL (ref 6–20)
CO2: 25 mmol/L (ref 22–32)
Calcium: 9.2 mg/dL (ref 8.9–10.3)
Chloride: 107 mmol/L (ref 98–111)
Creatinine, Ser: 1.03 mg/dL — ABNORMAL HIGH (ref 0.44–1.00)
GFR calc Af Amer: >60 mL/min (ref >60)
GFR calc non Af Amer: >60 mL/min (ref >60)
Glucose, Bld: 103 mg/dL — ABNORMAL HIGH (ref 70–99)
Potassium: 3.2 mmol/L — ABNORMAL LOW (ref 3.5–5.1)
Sodium: 141 mmol/L (ref 135–145)
Total Bilirubin: 0.4 mg/dL (ref 0.3–1.2)
Total Protein: 7.7 g/dL (ref 6.5–8.1)

## 2019-04-17 LAB — RESPIRATORY PANEL BY RT PCR (FLU A&B, COVID)
Influenza A by PCR: NEGATIVE
Influenza B by PCR: NEGATIVE
SARS Coronavirus 2 by RT PCR: POSITIVE — AB

## 2019-04-17 LAB — ETHANOL: Alcohol, Ethyl (B): <10 mg/dL (ref <10)

## 2019-04-17 MED ORDER — ACETAMINOPHEN 325 MG PO TABS: 650.0000 mg | ORAL_TABLET | ORAL | Status: DC | PRN

## 2019-04-17 MED ORDER — RISPERIDONE 2 MG PO TABS
3.5000 mg | ORAL_TABLET | Freq: Every day | ORAL | Status: DC
  Filled 2019-04-17: qty 1

## 2019-04-17 MED ORDER — RISPERIDONE 1 MG PO TABS
1.0000 mg | ORAL_TABLET | Freq: Every day | ORAL | Status: DC
  Administered 2019-04-17: 1 mg via ORAL
  Filled 2019-04-17: qty 1

## 2019-04-17 MED ORDER — ONDANSETRON HCL 4 MG PO TABS: 4.0000 mg | ORAL_TABLET | Freq: Three times a day (TID) | ORAL | Status: DC | PRN

## 2019-04-17 MED ORDER — BENZTROPINE MESYLATE 0.5 MG PO TABS
0.5000 mg | ORAL_TABLET | ORAL | Status: DC
  Filled 2019-04-17: qty 1

## 2019-04-17 MED ORDER — AMLODIPINE BESYLATE 5 MG PO TABS
10.0000 mg | ORAL_TABLET | Freq: Once | ORAL | Status: AC
  Administered 2019-04-17: 10 mg via ORAL
  Filled 2019-04-17: qty 2

## 2019-04-17 MED ORDER — POTASSIUM CHLORIDE CRYS ER 20 MEQ PO TBCR
40.0000 meq | EXTENDED_RELEASE_TABLET | Freq: Once | ORAL | Status: AC
  Administered 2019-04-17: 23:00:00 40 meq via ORAL
  Filled 2019-04-17: qty 2

## 2019-04-17 NOTE — ED Notes (Signed)
Took a bit of coaching to get her to take her K-Dur. She allowed it to dissolve in water and drank the water but there appeared to be some of the dissolved pill at the bottom of the cup, she did make a good effort and drank most of it. She did refuse the other meds which were Cogentin and Risperdal. She states she took medicine earlier and she doesn't want to take any more. Earlier she took 1 mg of Risperdal and Norvasc. Informed Sam with [name] of the events.
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
## 2019-04-17 NOTE — ED Notes (Signed)
Took Risperdal and Norvasc as ordered. Writer brought her a closed container of apple juice as she is paranoid, Opened it but then refused to drink it. She got her own water from the sink in the hall and took the meds. She then came up to ask for Tylenol for back pain. When writer offered to call Dr for it she told me to never mind. She is back at the window asking to have her clothes back. Told she would get her clothes when she is discharged and she is not discharge at this time.
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
## 2019-04-17 NOTE — ED Notes (Signed)
Received information from [facility], patient is COVID + and will moving to room 27 in TCU. Report given to [facility]. Security assisted with her move to [employer].

## 2019-04-18 ENCOUNTER — Encounter (HOSPITAL_COMMUNITY): Payer: Self-pay | Admitting: Registered Nurse

## 2019-04-18 DIAGNOSIS — F4323 Adjustment disorder with mixed anxiety and depressed mood: Secondary | ICD-10-CM

## 2019-04-18 NOTE — ED Notes (Addendum)
Pt refusing vitals and medications. Pt requesting to leave asap and stating that she wants to be removed from the IVC list. Pt requesting a chair. Took a blue chair to her room and instructed her how to lock it in place.

## 2019-04-18 NOTE — ED Notes (Addendum)
Pt opened door to room, clearly agitated. I asked her to close the door and said that I could come in and speak with her. She said, "No, I don't need your psychiatric services." She did not close the door. The NT and I asked her to close the door several times and reminded her that she is COVID positive. She said everyone has COVID and made two paranoid statements and closed the door.
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
## 2019-04-18 NOTE — ED Notes (Signed)
Pt opened door of her room, agitated, and wanted to know about the papers and when lunch will arrive. I'm assuming she is talking about the IVC paperwork. I let her know that she would be leaving today, we are working on her placement at a hotel, and lunch is delayed but on the way. I asked her to shut the door, and she backed into the room. I shut the door.
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
## 2019-04-18 NOTE — ED Notes (Addendum)
Screening at the entrance of [facility] ED called to let us know that [facility] transport is here to transport pt to [place].

## 2019-04-18 NOTE — ED Notes (Signed)
[name] consult complete  

## 2019-04-18 NOTE — Discharge Instructions (Signed)
For your behavioral health needs, you are advised to continue treatment with the [facility]: ° °     [facility] °     [facility], [address] °     [address] °     [place], [place]  [address] °     [phone] °     Crisis number: [phone] °
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
## 2019-04-18 NOTE — ED Notes (Signed)
Pt given breakfast.
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
## 2019-04-18 NOTE — BH Assessment (Addendum)
[place] Assessment Progress Note  Per Shuvon Rankin, FNP, this pt does not require psychiatric hospitalization at this time.  Pt presents under IVC initiated by her ACT Team case manager which has been rescinded by Hampton Abbot, MD.  Pt is to be discharged from [facility] with recommendation to continue treatment with the [facility].  This has been included in pt's discharge instructions.  Please refer to latest note from Golden Circle, [name], for further discharge accommodations.  Pt's nurse, Joellen Jersey, has been notified.  Jalene Mullet, [place] Triage Specialist [phone]

## 2019-04-18 NOTE — ED Notes (Signed)
Pt very angry affect guarded and refusing to talk to staff .

## 2019-04-19 NOTE — Progress Notes (Addendum)
   Thursday 04/20/2019  2nd shift ED [name] received a handoff from the 1st shift [facility] ED [name].   Pt's AVS is to be faxed to pt's ACTT Team ([facility]) at fax: [phone], per the 1st shift TOC [facility] ED [name] Nov[place] for the purpose of continuity of care.  [name] will continue to follow for D/C needs.  Alphonse Guild. Talton Delpriore, LCSW, LCAS, [facility] Transitions of Care Clinical Social Worker Care Coordination Department Ph: [phone]

## 2020-10-17 ENCOUNTER — Emergency Department (HOSPITAL_COMMUNITY)
Admission: EM | Admit: 2020-10-17 | Discharge: 2020-10-17 | Disposition: A | Discharge status: Home / Self Care | Payer: Self-pay | Attending: Emergency Medicine | Admitting: Emergency Medicine

## 2020-10-17 ENCOUNTER — Encounter (HOSPITAL_COMMUNITY): Payer: Self-pay

## 2020-10-17 DIAGNOSIS — Z5320 Procedure and treatment not carried out because of patient's decision for unspecified reasons: Secondary | ICD-10-CM | POA: Insufficient documentation

## 2020-10-17 DIAGNOSIS — F17200 Nicotine dependence, unspecified, uncomplicated: Secondary | ICD-10-CM | POA: Insufficient documentation

## 2020-10-17 DIAGNOSIS — I10 Essential (primary) hypertension: Secondary | ICD-10-CM | POA: Diagnosis not present

## 2020-10-17 NOTE — ED Notes (Signed)
Pt still refusing vitals at this time.
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
## 2020-10-17 NOTE — ED Provider Notes (Signed)
[facility]ENC[facility]PT Provider Note   CSN: [id number] Arrival date & time: 10/17/20  1820     History Chief Complaint  Patient presents with   Medical Clearance    "Hunger strike"    Holly Hines is a 50 y.o. female.  HPI     Holly Hines is a 50 y.o. female, with a history of HTN, presenting to the ED from the [facility] jail.  Report according to the paperwork from the jail nurse states patient has been refusing to eat over the last 2 to 3 days. Patient will not give [place] any information.  She states, "I do not want to say anything or tell you anything.  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 11/28/2014   tubal ectopic pregnancy unruptured, 12 wks    Acute psychosis ([facility])    Paranoid schizophrenia ([facility])    Hypertension in pregnancy, antepartum 10/23/2012   Schizophrenia, paranoid (HCC) 10/10/2011    Past Surgical History:  Procedure Laterality Date   CESAREAN SECTION  9 yrs ago    LAPAROTOMY N/A 11/27/2014   Procedure: EXPLORATORY LAPAROTOMY, Right Salpingectomy with Removal Ectopic Pregnancy;  Surgeon: Tilda Burrow, MD;  Location: [facility];  Service: Gynecology;  Laterality: N/A;     OB History     Gravida  2   Para  1   Term  1   Preterm      AB      Living  1      SAB      IAB      Ectopic      Multiple      Live Births              Family History  Family history unknown: Yes    Social History   Tobacco Use   Smoking status: Some Days    Pack years: 0.00   Tobacco comments:    unknown  Vaping Use   Vaping Use: Unknown  Substance Use Topics   Alcohol  use: No    Comment: unknown   Drug use: No    Comment: unknown    Home Medications Prior to Admission medications   Medication Sig Start Date End Date Taking? Authorizing Provider  benztropine (COGENTIN) 0.5 MG tablet Take 1 tablet (0.5 mg total) by mouth 2 (two) times daily in the am and at bedtime.. 12/05/14   Withrow, Everardo All, FNP  risperiDONE (RISPERDAL) 1 MG tablet Take 3.5 tablets (3.5 mg total) by mouth at bedtime. 12/05/14   Withrow, Everardo All, FNP  risperiDONE (RISPERDAL) 1 MG tablet Take 1 tablet (1 mg total) by mouth daily. 12/05/14   Withrow, Everardo All, FNP    Allergies    Pollen extract  Review of Systems   Review of Systems  Unable to perform ROS: Other (Patient refused to answer any questions)   Physical Exam Updated Vital Signs There were no vitals taken for this visit.  Physical Exam Vitals and nursing note reviewed.  Constitutional:      General: She is not in acute distress.    Appearance:  Normal appearance. She is well-developed. She is not diaphoretic.  HENT:     Head: Normocephalic and atraumatic.  Eyes:     Conjunctiva/sclera: Conjunctivae normal.  Cardiovascular:     Rate and Rhythm: Normal rate and regular rhythm.  Pulmonary:     Effort: Pulmonary effort is normal.     Comments: No increased work of breathing or tachypnea. Musculoskeletal:     Cervical back: Neck supple.  Skin:    General: Skin is warm and dry.     Coloration: Skin is not pale.  Neurological:     Mental Status: She is alert.     Comments: Patient noted to move all of her extremities without noted difficulty.  She is able to walk and move under her own power.  Psychiatric:        Behavior: Behavior normal.     Comments: Patient will interact and respond to attempts to obtain vital signs or more information.    ED Results / Procedures / Treatments   Labs (all labs ordered are listed, but only abnormal results are displayed) Labs Reviewed - No data to  display  EKG None  Radiology No results found.  Procedures Procedures   Medications Ordered in ED Medications - No data to display  ED Course  I have reviewed the triage vital signs and the nursing notes.  Pertinent labs & imaging results that were available during my care of the patient were reviewed by me and considered in my medical decision making (see chart for details).    MDM Rules/Calculators/A&P                          Patient presents from the jail with report that she has not been eating. Patient states she does not want any evaluation, including vital signs. She seems to be interacting well. Officers accompanying the patient state once patient returns to the jail and is fingerprinted she will be released.  Findings and plan of care discussed with attending physician, Arby Barrette, MD. Dr. Donnald Garre personally evaluated and examined this patient.   Final Clinical Impression(s) / ED Diagnoses Final diagnoses:  Refused assessment of physical health    Rx / DC Orders ED Discharge Orders     None        Concepcion Living 10/18/20 2202    Arby Barrette, MD 10/23/20 [phone]

## 2020-10-17 NOTE — ED Notes (Signed)
Attempted VS. Pt refusing

## 2023-12-29 ENCOUNTER — Ambulatory Visit (INDEPENDENT_AMBULATORY_CARE_PROVIDER_SITE_OTHER): Payer: Self-pay | Admitting: Primary Care
